# Patient Record
Sex: Male | Born: 1950 | Race: White | Hispanic: No | Marital: Married | Smoking: Never smoker
Health system: Southern US, Community
[De-identification: ages and names within clinical notes are randomized; demographics above are authoritative.]

## PROBLEM LIST (undated history)

## (undated) DIAGNOSIS — Z8619 Personal history of other infectious and parasitic diseases: Secondary | ICD-10-CM

## (undated) DIAGNOSIS — E785 Hyperlipidemia, unspecified: Secondary | ICD-10-CM

## (undated) DIAGNOSIS — K921 Melena: Secondary | ICD-10-CM

## (undated) DIAGNOSIS — R112 Nausea with vomiting, unspecified: Secondary | ICD-10-CM

## (undated) DIAGNOSIS — Z9889 Other specified postprocedural states: Secondary | ICD-10-CM

## (undated) HISTORY — DX: Hyperlipidemia, unspecified: E78.5

## (undated) HISTORY — DX: Melena: K92.1

## (undated) HISTORY — DX: Personal history of other infectious and parasitic diseases: Z86.19

## (undated) HISTORY — PX: COLONOSCOPY: SHX174

---

## 1996-07-13 HISTORY — PX: CHOLECYSTECTOMY: SHX55

## 2008-07-05 LAB — LIPID PANEL
CHOLESTEROL: 256 mg/dL — AB (ref 0–200)
HDL: 50 mg/dL (ref 35–70)
LDL Cholesterol: 167 mg/dL
Triglycerides: 195 mg/dL — AB (ref 40–160)

## 2008-10-03 LAB — LIPID PANEL
CHOLESTEROL: 199 mg/dL (ref 0–200)
HDL: 42 mg/dL (ref 35–70)
LDL Cholesterol: 134 mg/dL
Triglycerides: 116 mg/dL (ref 40–160)

## 2013-10-04 LAB — HM COLONOSCOPY

## 2013-10-05 LAB — CBC AND DIFFERENTIAL
HEMATOCRIT: 38 % — AB (ref 41–53)
Hemoglobin: 4.1 g/dL — AB (ref 13.5–17.5)
PLATELETS: 228 10*3/uL (ref 150–399)
WBC: 8.3 10^3/mL

## 2013-10-05 LAB — HEPATIC FUNCTION PANEL
ALT: 29 U/L (ref 10–40)
AST: 26 U/L (ref 14–40)
Alkaline Phosphatase: 84 U/L (ref 25–125)
BILIRUBIN DIRECT: 0.4 mg/dL (ref 0.01–0.4)
Bilirubin, Total: 0.5 mg/dL

## 2013-10-05 LAB — BASIC METABOLIC PANEL
BUN: 20 mg/dL (ref 4–21)
Creatinine: 1.1 mg/dL (ref 0.6–1.3)
GLUCOSE: 113 mg/dL
Potassium: 4.1 mmol/L (ref 3.4–5.3)
Sodium: 136 mmol/L — AB (ref 137–147)

## 2013-10-05 LAB — POCT INR: INR: 1 (ref 0.9–1.1)

## 2013-10-06 LAB — LIPID PANEL
CHOLESTEROL: 162 mg/dL (ref 0–200)
HDL: 37 mg/dL (ref 35–70)
LDL Cholesterol: 114 mg/dL
Triglycerides: 165 mg/dL — AB (ref 40–160)

## 2013-10-06 LAB — CBC AND DIFFERENTIAL
HCT: 34 % — AB (ref 41–53)
Hemoglobin: 11.5 g/dL — AB (ref 13.5–17.5)
Platelets: 213 10*3/uL (ref 150–399)
WBC: 5.4 10*3/mL

## 2013-10-06 LAB — HEMOGLOBIN A1C: Hgb A1c MFr Bld: 5.8 % (ref 4.0–6.0)

## 2013-10-10 LAB — CBC AND DIFFERENTIAL
HCT: 31 % — AB (ref 41–53)
HEMOGLOBIN: 10.3 g/dL — AB (ref 13.5–17.5)
Platelets: 255 10*3/uL (ref 150–399)
WBC: 5.6 10^3/mL

## 2013-10-19 LAB — CBC AND DIFFERENTIAL
HEMATOCRIT: 36 % — AB (ref 41–53)
HEMOGLOBIN: 11.5 g/dL — AB (ref 13.5–17.5)
Platelets: 292 10*3/uL (ref 150–399)
WBC: 5 10^3/mL

## 2013-10-31 LAB — HM COLONOSCOPY

## 2014-05-08 ENCOUNTER — Encounter: Payer: Self-pay | Admitting: Internal Medicine

## 2014-05-08 ENCOUNTER — Ambulatory Visit (INDEPENDENT_AMBULATORY_CARE_PROVIDER_SITE_OTHER): Payer: BC Managed Care – PPO | Admitting: Internal Medicine

## 2014-05-08 VITALS — BP 130/80 | HR 67 | Temp 97.9°F | Ht 68.0 in | Wt 158.5 lb

## 2014-05-08 DIAGNOSIS — M25562 Pain in left knee: Secondary | ICD-10-CM

## 2014-05-08 DIAGNOSIS — D649 Anemia, unspecified: Secondary | ICD-10-CM

## 2014-05-08 DIAGNOSIS — Z23 Encounter for immunization: Secondary | ICD-10-CM

## 2014-05-08 DIAGNOSIS — E7800 Pure hypercholesterolemia, unspecified: Secondary | ICD-10-CM

## 2014-05-08 DIAGNOSIS — Z125 Encounter for screening for malignant neoplasm of prostate: Secondary | ICD-10-CM

## 2014-05-08 DIAGNOSIS — R5383 Other fatigue: Secondary | ICD-10-CM

## 2014-05-08 DIAGNOSIS — G43109 Migraine with aura, not intractable, without status migrainosus: Secondary | ICD-10-CM

## 2014-05-08 DIAGNOSIS — G43809 Other migraine, not intractable, without status migrainosus: Secondary | ICD-10-CM

## 2014-05-08 DIAGNOSIS — R739 Hyperglycemia, unspecified: Secondary | ICD-10-CM

## 2014-05-08 DIAGNOSIS — L989 Disorder of the skin and subcutaneous tissue, unspecified: Secondary | ICD-10-CM

## 2014-05-08 NOTE — Progress Notes (Signed)
Pre visit review using our clinic review tool, if applicable. No additional management support is needed unless otherwise documented below in the visit note. 

## 2014-05-13 ENCOUNTER — Encounter: Payer: Self-pay | Admitting: Internal Medicine

## 2014-05-13 DIAGNOSIS — D649 Anemia, unspecified: Secondary | ICD-10-CM | POA: Insufficient documentation

## 2014-05-13 DIAGNOSIS — E7800 Pure hypercholesterolemia, unspecified: Secondary | ICD-10-CM | POA: Insufficient documentation

## 2014-05-13 DIAGNOSIS — L989 Disorder of the skin and subcutaneous tissue, unspecified: Secondary | ICD-10-CM | POA: Insufficient documentation

## 2014-05-13 DIAGNOSIS — R739 Hyperglycemia, unspecified: Secondary | ICD-10-CM | POA: Insufficient documentation

## 2014-05-13 DIAGNOSIS — G43109 Migraine with aura, not intractable, without status migrainosus: Secondary | ICD-10-CM | POA: Insufficient documentation

## 2014-05-13 DIAGNOSIS — M25562 Pain in left knee: Secondary | ICD-10-CM | POA: Insufficient documentation

## 2014-05-13 DIAGNOSIS — R5383 Other fatigue: Secondary | ICD-10-CM | POA: Insufficient documentation

## 2014-05-13 NOTE — Progress Notes (Signed)
Subjective:    Patient ID: Elijah Walker, male    DOB: 17-Mar-1951, 63 y.o.   MRN: [id number]  HPI 63 year old male with past history of ulcerative colitis, occular migraines and hypercholesterolemia.  He comes in today to follow up on these issues as well as to establish care.  He just moved here from [place].  Has been followed by Dr Veronda Prude at [facility] in [place], [place].  States was diagnosed with ulcerative colitis 20 years ago.  States not an issue for him now.  No blood in the stool.  Bowels doing well.  Has occular migraines.  First diagnosed 40 years ago.  Describes floaters and bright squiggles.  Has noticed some foods trigger.   Was diagnosed previously with elevated cholesterol.  Does exercise.  States jog/walks 30-45 minutes 3x/wk.  Takes omega.  Overall feels good.     Past Medical History  Diagnosis Date  . History of chicken pox   . Blood in stool     H/O  . Hyperlipidemia     Outpatient Encounter Prescriptions as of 05/08/2014  Medication Sig  . Omega-3 Fatty Acids (OMEGA 3 PO) Take by mouth 4 (four) times daily.    Review of Systems Patient denies any headache, lightheadedness or dizziness.  Occular migraines as outlined.  Non increased sinus congestion or drainage.  No chest pain, tightness or palpatations.  No increased shortness of breath, cough or congestion.  No nausea or vomiting.  No abdominal pain or cramping.  No bowel change, such as diarrhea, constipation, BRBPR or melana.  No urine change.  Does report a persistent forearm lesion.  Also reports increased pain in left knee (lateral aspect).  Not as bad lately.  Has been exercising.  Appears to be helping.  occasional ankle swelling (minimal).  Not a significant issues.        Objective:   Physical Exam Filed Vitals:   05/08/14 1112  BP: 130/80  Pulse: 67  Temp: 97.9 F (36.6 C)   Blood pressure recheck:  32/75  63 year old male in no acute distress.  HEENT:  Nares - clear.  Oropharynx - without  lesions. NECK:  Supple.  Nontender.  No audible carotid bruit.  HEART:  Appears to be regular.   LUNGS:  No crackles or wheezing audible.  Respirations even and unlabored.   RADIAL PULSE:  Equal bilaterally.  ABDOMEN:  Soft.  Nontender.  Bowel sounds present and normal.  No audible abdominal bruit.   EXTREMITIES:  No increased edema present.  DP pulses palpable and equal bilaterally.         Assessment & Plan:  1. Skin lesion of left arm Persistent left arm lesion.   - Ambulatory referral to Dermatology  2. Encounter for immunization Flu shot given today.   3. Hypercholesterolemia Low cholesterol diet and exericse.  - Lipid panel; Future  4. Ocular migraine Has been stable for 40 years.  Follow.  Continue to f/u with opthalmology.    5. Left knee pain Persistent knee pain.  Better now.  Follow.   6. Other fatigue - Comprehensive metabolic panel; Future - TSH; Future Reported some increased fatigue previously.  Exercising now. Follow.    7. Anemia, unspecified anemia type Noted on outside labs.  - CBC with Differential; Future - Ferritin; Future - Vitamin B12; Future  8. Hyperglycemia Noted on outside labs - elevated glucose.  Low carb diet.  - Hemoglobin A1c; Future  9. Prostate cancer screening - PSA; Future  HEALTH MAINTENANCE.  Schedule him for a physical.  Up to date with colonoscopy.  Last 2014.  Obtain records to review.    I spent 45 minutes with the patient and more than 50% of the time was spent in consultation regarding the above , specifically obtaining information from his past history and discussing current concerns and plan for further evaluation.

## 2014-05-15 ENCOUNTER — Other Ambulatory Visit (INDEPENDENT_AMBULATORY_CARE_PROVIDER_SITE_OTHER): Payer: BC Managed Care – PPO

## 2014-05-15 DIAGNOSIS — R5383 Other fatigue: Secondary | ICD-10-CM

## 2014-05-15 DIAGNOSIS — R739 Hyperglycemia, unspecified: Secondary | ICD-10-CM

## 2014-05-15 DIAGNOSIS — D649 Anemia, unspecified: Secondary | ICD-10-CM

## 2014-05-15 DIAGNOSIS — E7800 Pure hypercholesterolemia, unspecified: Secondary | ICD-10-CM

## 2014-05-15 DIAGNOSIS — Z125 Encounter for screening for malignant neoplasm of prostate: Secondary | ICD-10-CM

## 2014-05-15 LAB — CBC WITH DIFFERENTIAL/PLATELET
BASOS ABS: 0 10*3/uL (ref 0.0–0.1)
Basophils Relative: 0.5 % (ref 0.0–3.0)
Eosinophils Absolute: 0.2 10*3/uL (ref 0.0–0.7)
Eosinophils Relative: 2.7 % (ref 0.0–5.0)
HEMATOCRIT: 48.1 % (ref 39.0–52.0)
Hemoglobin: 15.9 g/dL (ref 13.0–17.0)
LYMPHS ABS: 1.8 10*3/uL (ref 0.7–4.0)
Lymphocytes Relative: 32.5 % (ref 12.0–46.0)
MCHC: 33 g/dL (ref 30.0–36.0)
MCV: 95 fl (ref 78.0–100.0)
MONOS PCT: 8.1 % (ref 3.0–12.0)
Monocytes Absolute: 0.5 10*3/uL (ref 0.1–1.0)
NEUTROS PCT: 56.2 % (ref 43.0–77.0)
Neutro Abs: 3.1 10*3/uL (ref 1.4–7.7)
PLATELETS: 202 10*3/uL (ref 150.0–400.0)
RBC: 5.06 Mil/uL (ref 4.22–5.81)
RDW: 14.3 % (ref 11.5–15.5)
WBC: 5.6 10*3/uL (ref 4.0–10.5)

## 2014-05-15 LAB — LIPID PANEL
Cholesterol: 228 mg/dL — ABNORMAL HIGH (ref 0–200)
HDL: 42.5 mg/dL (ref >39.00)
LDL Cholesterol: 166 mg/dL — ABNORMAL HIGH (ref 0–99)
NONHDL: 185.5
Total CHOL/HDL Ratio: 5
Triglycerides: 99 mg/dL (ref 0.0–149.0)
VLDL: 19.8 mg/dL (ref 0.0–40.0)

## 2014-05-15 LAB — COMPREHENSIVE METABOLIC PANEL
ALBUMIN: 3.6 g/dL (ref 3.5–5.2)
ALK PHOS: 67 U/L (ref 39–117)
ALT: 21 U/L (ref 0–53)
AST: 20 U/L (ref 0–37)
BILIRUBIN TOTAL: 1 mg/dL (ref 0.2–1.2)
BUN: 17 mg/dL (ref 6–23)
CO2: 28 mEq/L (ref 19–32)
Calcium: 9.7 mg/dL (ref 8.4–10.5)
Chloride: 105 mEq/L (ref 96–112)
Creatinine, Ser: 1.3 mg/dL (ref 0.4–1.5)
GFR: 57.75 mL/min — ABNORMAL LOW (ref >60.00)
Glucose, Bld: 96 mg/dL (ref 70–99)
POTASSIUM: 5.5 meq/L — AB (ref 3.5–5.1)
Sodium: 141 mEq/L (ref 135–145)
Total Protein: 6.9 g/dL (ref 6.0–8.3)

## 2014-05-15 LAB — VITAMIN B12: Vitamin B-12: 691 pg/mL (ref 211–911)

## 2014-05-15 LAB — FERRITIN: Ferritin: 20 ng/mL — ABNORMAL LOW (ref 22.0–322.0)

## 2014-05-15 LAB — PSA: PSA: 1.09 ng/mL (ref 0.10–4.00)

## 2014-05-15 LAB — TSH: TSH: 1.96 u[IU]/mL (ref 0.35–4.50)

## 2014-05-15 LAB — HEMOGLOBIN A1C: Hgb A1c MFr Bld: 5.7 % (ref 4.6–6.5)

## 2014-05-16 ENCOUNTER — Other Ambulatory Visit: Payer: Self-pay | Admitting: *Deleted

## 2014-05-16 ENCOUNTER — Other Ambulatory Visit: Payer: Self-pay | Admitting: Internal Medicine

## 2014-05-16 DIAGNOSIS — E875 Hyperkalemia: Secondary | ICD-10-CM

## 2014-05-16 MED ORDER — PRAVASTATIN SODIUM 10 MG PO TABS: 10.0000 mg | ORAL_TABLET | Freq: Every day | ORAL | Status: DC

## 2014-05-16 NOTE — Progress Notes (Signed)
Order placed for f/u labs.  

## 2014-05-18 ENCOUNTER — Other Ambulatory Visit (INDEPENDENT_AMBULATORY_CARE_PROVIDER_SITE_OTHER): Payer: BC Managed Care – PPO

## 2014-05-18 DIAGNOSIS — E875 Hyperkalemia: Secondary | ICD-10-CM

## 2014-05-18 LAB — CREATININE, SERUM: CREATININE: 1.3 mg/dL (ref 0.4–1.5)

## 2014-05-18 LAB — POTASSIUM: POTASSIUM: 4.9 meq/L (ref 3.5–5.1)

## 2014-06-21 ENCOUNTER — Other Ambulatory Visit: Payer: Self-pay | Admitting: Internal Medicine

## 2014-06-27 ENCOUNTER — Telehealth: Payer: Self-pay | Admitting: *Deleted

## 2014-06-27 DIAGNOSIS — E7800 Pure hypercholesterolemia, unspecified: Secondary | ICD-10-CM

## 2014-06-27 NOTE — Telephone Encounter (Signed)
What lab and dx?

## 2014-06-28 ENCOUNTER — Encounter: Payer: Self-pay | Admitting: *Deleted

## 2014-06-28 ENCOUNTER — Other Ambulatory Visit (INDEPENDENT_AMBULATORY_CARE_PROVIDER_SITE_OTHER): Payer: BC Managed Care – PPO

## 2014-06-28 DIAGNOSIS — E7800 Pure hypercholesterolemia, unspecified: Secondary | ICD-10-CM

## 2014-06-28 LAB — HEPATIC FUNCTION PANEL
ALBUMIN: 4.2 g/dL (ref 3.5–5.2)
ALT: 20 U/L (ref 0–53)
AST: 19 U/L (ref 0–37)
Alkaline Phosphatase: 62 U/L (ref 39–117)
Bilirubin, Direct: 0.2 mg/dL (ref 0.0–0.3)
TOTAL PROTEIN: 6.9 g/dL (ref 6.0–8.3)
Total Bilirubin: 0.9 mg/dL (ref 0.2–1.2)

## 2014-06-28 LAB — CREATININE, SERUM: CREATININE: 1.1 mg/dL (ref 0.4–1.5)

## 2014-06-28 NOTE — Telephone Encounter (Signed)
Orders placed for f/u labs.  

## 2014-07-02 ENCOUNTER — Telehealth: Payer: Self-pay | Admitting: Internal Medicine

## 2014-07-02 NOTE — Telephone Encounter (Signed)
Spoke to patient about his labs. Advised only a liver panel was checked after starting on a statin,  verbalized understanding

## 2014-07-02 NOTE — Telephone Encounter (Signed)
Elijah Walker called concerned about his labs. He said he received the results but saw nothing in regards to his Cholesterol. He thought he was having labs so he could see if the Prilostatin was helping him or harming him. Please call the pt to discuss why he received the labs and why Cholesterol wasn't included.  Pt ph# [phone] Thank you.

## 2014-08-08 ENCOUNTER — Encounter: Payer: Self-pay | Admitting: Internal Medicine

## 2014-08-14 ENCOUNTER — Ambulatory Visit (INDEPENDENT_AMBULATORY_CARE_PROVIDER_SITE_OTHER): Payer: BLUE CROSS/BLUE SHIELD | Admitting: Internal Medicine

## 2014-08-14 ENCOUNTER — Encounter: Payer: Self-pay | Admitting: Internal Medicine

## 2014-08-14 VITALS — BP 131/76 | HR 61 | Temp 98.1°F | Ht 68.0 in | Wt 162.8 lb

## 2014-08-14 DIAGNOSIS — E7800 Pure hypercholesterolemia, unspecified: Secondary | ICD-10-CM

## 2014-08-14 DIAGNOSIS — R739 Hyperglycemia, unspecified: Secondary | ICD-10-CM

## 2014-08-14 DIAGNOSIS — D649 Anemia, unspecified: Secondary | ICD-10-CM

## 2014-08-14 DIAGNOSIS — Z0000 Encounter for general adult medical examination without abnormal findings: Secondary | ICD-10-CM

## 2014-08-14 NOTE — Progress Notes (Signed)
Pre visit review using our clinic review tool, if applicable. No additional management support is needed unless otherwise documented below in the visit note. 

## 2014-08-16 ENCOUNTER — Telehealth: Payer: Self-pay | Admitting: *Deleted

## 2014-08-16 NOTE — Telephone Encounter (Signed)
Pt notified that health exam certification has been completed & placed up front for pick up.

## 2014-08-18 ENCOUNTER — Encounter: Payer: Self-pay | Admitting: Internal Medicine

## 2014-08-18 DIAGNOSIS — Z0000 Encounter for general adult medical examination without abnormal findings: Secondary | ICD-10-CM | POA: Insufficient documentation

## 2014-08-18 NOTE — Assessment & Plan Note (Signed)
On pravastatin now.  Leg tightness resolved.  Liver panel checked 06/2014 wnl.  Check lipid panel and metabolic panel with next fasting labs.

## 2014-08-18 NOTE — Progress Notes (Signed)
Patient ID: Elijah Walker, male   DOB: 04-15-1951, 64 y.o.   MRN: [id number]   Subjective:    Patient ID: Elijah Walker, male    DOB: 1950/10/30, 64 y.o.   MRN: [id number]  HPI  Patient here for his physical exam.  Has a history of hypercholesterolemia.  Started pravastatin.  Felt tightness in his legs when first starting.  Stretches.  Better now.  Discussed CoQ10.  Stays active.  Plans to start substitute teaching.     Past Medical History  Diagnosis Date  . History of chicken pox   . Blood in stool     H/O  . Hyperlipidemia     Current Outpatient Prescriptions on File Prior to Visit  Medication Sig Dispense Refill  . Omega-3 Fatty Acids (OMEGA 3 PO) Take by mouth 4 (four) times daily.    . pravastatin (PRAVACHOL) 10 MG tablet TAKE 1 TABLET (10 MG TOTAL) BY MOUTH DAILY. 30 tablet 1   No current facility-administered medications on file prior to visit.    Review of Systems  Constitutional: Negative for fatigue and unexpected weight change.  HENT: Negative for congestion, sinus pressure and sore throat.   Eyes: Negative for pain and visual disturbance.  Respiratory: Negative for cough, chest tightness and shortness of breath.   Cardiovascular: Negative for chest pain, palpitations and leg swelling.  Gastrointestinal: Negative for abdominal pain, diarrhea and constipation.  Genitourinary: Negative for frequency and difficulty urinating.  Musculoskeletal: Negative for back pain and joint swelling.       Previous leg tightness resolved.  Plans to continue stretches.    Skin: Negative for rash and wound.  Neurological: Negative for dizziness, light-headedness and headaches.  Hematological: Negative for adenopathy. Does not bruise/bleed easily.  Psychiatric/Behavioral: Negative for dysphoric mood and decreased concentration.       Objective:    Physical Exam  Constitutional: He is oriented to person, place, and time. He appears well-developed and well-nourished. No distress.    HENT:  Head: Normocephalic and atraumatic.  Nose: Nose normal.  Mouth/Throat: Oropharynx is clear and moist.  Eyes: Conjunctivae are normal. Right eye exhibits no discharge. Left eye exhibits no discharge.  Neck: Neck supple. No thyromegaly present.  Cardiovascular: Normal rate and regular rhythm.   Pulmonary/Chest: Breath sounds normal. No respiratory distress. He has no wheezes.  Abdominal: Soft. Bowel sounds are normal. There is no tenderness.  Genitourinary: Rectum normal and penis normal.  No testicular nodules.  Heme negative.    Musculoskeletal: He exhibits no edema or tenderness.  Lymphadenopathy:    He has no cervical adenopathy.  Neurological: He is alert and oriented to person, place, and time.  Skin: Skin is warm and dry. No rash noted.  Psychiatric: He has a normal mood and affect. His behavior is normal.    BP 131/76 mmHg  Pulse 61  Temp(Src) 98.1 F (36.7 C) (Oral)  Ht 5\' 8"  (1.727 m)  Wt 162 lb 12 oz (73.823 kg)  BMI 24.75 kg/m2  SpO2 96% Wt Readings from Last 3 Encounters:  08/14/14 162 lb 12 oz (73.823 kg)  05/08/14 158 lb 8 oz (71.895 kg)     Lab Results  Component Value Date   WBC 5.6 05/15/2014   HGB 15.9 05/15/2014   HCT 48.1 05/15/2014   PLT 202.0 05/15/2014   GLUCOSE 96 05/15/2014   CHOL 228* 05/15/2014   TRIG 99.0 05/15/2014   HDL 42.50 05/15/2014   LDLCALC 166* 05/15/2014   ALT 20 06/28/2014   AST  19 06/28/2014   NA 141 05/15/2014   K 4.9 05/18/2014   CL 105 05/15/2014   CREATININE 1.1 06/28/2014   BUN 17 05/15/2014   CO2 28 05/15/2014   TSH 1.96 05/15/2014   PSA 1.09 05/15/2014   INR 1.0 10/05/2013   HGBA1C 5.7 05/15/2014       Assessment & Plan:   Problem List Items Addressed This Visit    Anemia    Hgb checked 05/2014 - wnl.        Health care maintenance    Physical today.  PSA 05/15/14 - 1.09.  Colonoscopy 10/31/13.        Hypercholesterolemia    On pravastatin now.  Leg tightness resolved.  Liver panel checked  06/2014 wnl.  Check lipid panel and metabolic panel with next fasting labs.        Relevant Orders   Lipid panel   Hyperglycemia - Primary    Fasting glucose checked in 11/15 - wnl.        Relevant Orders   Comprehensive metabolic panel     I spent 25 minutes with the patient and more than 50% of the time was spent in consultation regarding the above.     [name], MD

## 2014-08-18 NOTE — Assessment & Plan Note (Signed)
Physical today.  PSA 05/15/14 - 1.09.  Colonoscopy 10/31/13.

## 2014-08-18 NOTE — Assessment & Plan Note (Signed)
Fasting glucose checked in 11/15 - wnl.

## 2014-08-18 NOTE — Assessment & Plan Note (Signed)
Hgb checked 05/2014 - wnl.

## 2014-08-29 ENCOUNTER — Ambulatory Visit (INDEPENDENT_AMBULATORY_CARE_PROVIDER_SITE_OTHER): Payer: BLUE CROSS/BLUE SHIELD | Admitting: *Deleted

## 2014-08-29 DIAGNOSIS — Z111 Encounter for screening for respiratory tuberculosis: Secondary | ICD-10-CM

## 2014-08-31 LAB — TB SKIN TEST
Induration: 0 mm
TB SKIN TEST: NEGATIVE

## 2014-09-08 ENCOUNTER — Other Ambulatory Visit: Payer: Self-pay | Admitting: Internal Medicine

## 2014-09-11 ENCOUNTER — Other Ambulatory Visit (INDEPENDENT_AMBULATORY_CARE_PROVIDER_SITE_OTHER): Payer: BLUE CROSS/BLUE SHIELD

## 2014-09-11 DIAGNOSIS — E7800 Pure hypercholesterolemia, unspecified: Secondary | ICD-10-CM

## 2014-09-11 DIAGNOSIS — R739 Hyperglycemia, unspecified: Secondary | ICD-10-CM

## 2014-09-11 LAB — COMPREHENSIVE METABOLIC PANEL
ALK PHOS: 65 U/L (ref 39–117)
ALT: 20 U/L (ref 0–53)
AST: 17 U/L (ref 0–37)
Albumin: 4.4 g/dL (ref 3.5–5.2)
BILIRUBIN TOTAL: 0.7 mg/dL (ref 0.2–1.2)
BUN: 16 mg/dL (ref 6–23)
CALCIUM: 9.8 mg/dL (ref 8.4–10.5)
CO2: 30 mEq/L (ref 19–32)
CREATININE: 1.3 mg/dL (ref 0.40–1.50)
Chloride: 105 mEq/L (ref 96–112)
GFR: 59.22 mL/min — ABNORMAL LOW (ref >60.00)
Glucose, Bld: 112 mg/dL — ABNORMAL HIGH (ref 70–99)
Potassium: 4.5 mEq/L (ref 3.5–5.1)
SODIUM: 141 meq/L (ref 135–145)
Total Protein: 7 g/dL (ref 6.0–8.3)

## 2014-09-11 LAB — LIPID PANEL
Cholesterol: 181 mg/dL (ref 0–200)
HDL: 43.2 mg/dL (ref >39.00)
LDL Cholesterol: 112 mg/dL — ABNORMAL HIGH (ref 0–99)
NONHDL: 137.8
TRIGLYCERIDES: 128 mg/dL (ref 0.0–149.0)
Total CHOL/HDL Ratio: 4
VLDL: 25.6 mg/dL (ref 0.0–40.0)

## 2014-09-12 ENCOUNTER — Encounter: Payer: Self-pay | Admitting: *Deleted

## 2014-09-12 ENCOUNTER — Other Ambulatory Visit: Payer: Self-pay | Admitting: Internal Medicine

## 2014-09-12 DIAGNOSIS — R739 Hyperglycemia, unspecified: Secondary | ICD-10-CM

## 2014-09-12 NOTE — Progress Notes (Signed)
Order placed for f/u labs.  

## 2014-10-04 ENCOUNTER — Other Ambulatory Visit (INDEPENDENT_AMBULATORY_CARE_PROVIDER_SITE_OTHER): Payer: BLUE CROSS/BLUE SHIELD

## 2014-10-04 DIAGNOSIS — R739 Hyperglycemia, unspecified: Secondary | ICD-10-CM

## 2014-10-04 LAB — BASIC METABOLIC PANEL
BUN: 15 mg/dL (ref 6–23)
CO2: 30 mEq/L (ref 19–32)
Calcium: 9.1 mg/dL (ref 8.4–10.5)
Chloride: 104 mEq/L (ref 96–112)
Creatinine, Ser: 1.15 mg/dL (ref 0.40–1.50)
GFR: 68.21 mL/min (ref >60.00)
Glucose, Bld: 89 mg/dL (ref 70–99)
Potassium: 4.6 mEq/L (ref 3.5–5.1)
SODIUM: 139 meq/L (ref 135–145)

## 2014-10-04 LAB — HEMOGLOBIN A1C: Hgb A1c MFr Bld: 5.9 % (ref 4.6–6.5)

## 2014-10-08 ENCOUNTER — Encounter: Payer: Self-pay | Admitting: *Deleted

## 2014-11-29 ENCOUNTER — Other Ambulatory Visit: Payer: Self-pay | Admitting: Internal Medicine

## 2015-01-10 ENCOUNTER — Ambulatory Visit (INDEPENDENT_AMBULATORY_CARE_PROVIDER_SITE_OTHER): Payer: BC Managed Care – PPO | Admitting: Internal Medicine

## 2015-01-10 ENCOUNTER — Encounter: Payer: Self-pay | Admitting: Internal Medicine

## 2015-01-10 VITALS — BP 121/78 | HR 55 | Temp 97.6°F | Ht 68.0 in | Wt 158.4 lb

## 2015-01-10 DIAGNOSIS — R067 Sneezing: Secondary | ICD-10-CM

## 2015-01-10 DIAGNOSIS — E7800 Pure hypercholesterolemia, unspecified: Secondary | ICD-10-CM

## 2015-01-10 DIAGNOSIS — R739 Hyperglycemia, unspecified: Secondary | ICD-10-CM

## 2015-01-10 DIAGNOSIS — Z0000 Encounter for general adult medical examination without abnormal findings: Secondary | ICD-10-CM | POA: Diagnosis not present

## 2015-01-10 NOTE — Progress Notes (Signed)
Patient ID: Elijah Walker, male   DOB: 1951/03/24, 64 y.o.   MRN: [id number]   Subjective:    Patient ID: Elijah Walker, male    DOB: September 13, 1950, 64 y.o.   MRN: [id number]  HPI  Patient here for a scheduled follow up.  Noticed (starting yesterday) some increased post nasal drainage and runny nose.  Some sneezing. No fever.  Minimal sore throat.  Taking vitamin C, benadryl and zinc.  Has lost weight.  Has adjusted his diet and was trying to lose weight.  No nausea or vomiting.  No bowel change.  Is exercising.  Walking.  Doing push ups.  No cardiac symptom with increased activity or exertion.  No sob.     Past Medical History  Diagnosis Date  . History of chicken pox   . Blood in stool     H/O  . Hyperlipidemia     Current Outpatient Prescriptions on File Prior to Visit  Medication Sig Dispense Refill  . Omega-3 Fatty Acids (OMEGA 3 PO) Take by mouth 4 (four) times daily.    . pravastatin (PRAVACHOL) 10 MG tablet TAKE 1 TABLET (10 MG TOTAL) BY MOUTH DAILY. 30 tablet 1   No current facility-administered medications on file prior to visit.    Review of Systems  Constitutional: Negative for appetite change and unexpected weight change (has adjusted his diiet.  trying to lose weight.  ).  HENT: Negative for sinus pressure.        Some sneezing.  Post nasal drainage.  Runny nose.  Minimal sore throat.   Eyes: Negative for pain and redness.  Respiratory: Negative for chest tightness and shortness of breath.   Cardiovascular: Negative for chest pain, palpitations and leg swelling.  Gastrointestinal: Negative for nausea, vomiting, abdominal pain and diarrhea.  Neurological: Negative for dizziness, light-headedness and headaches.  Psychiatric/Behavioral: Negative for dysphoric mood and agitation.       Objective:    Physical Exam  Constitutional: He appears well-developed and well-nourished. No distress.  HENT:  Nose: Nose normal.  Mouth/Throat: Oropharynx is clear and moist.  No  sinus tenderness to palpation.   Neck: Neck supple. No thyromegaly present.  Cardiovascular: Normal rate and regular rhythm.   Pulmonary/Chest: Effort normal and breath sounds normal. No respiratory distress.  Abdominal: Soft. Bowel sounds are normal. There is no tenderness.  Musculoskeletal: He exhibits no edema or tenderness.  Lymphadenopathy:    He has no cervical adenopathy.  Skin: No rash noted. No erythema.  Psychiatric: He has a normal mood and affect. His behavior is normal.    BP 121/78 mmHg  Pulse 55  Temp(Src) 97.6 F (36.4 C) (Oral)  Ht '5\' 8"'  (1.727 m)  Wt 158 lb 6 oz (71.838 kg)  BMI 24.09 kg/m2  SpO2 98% Wt Readings from Last 3 Encounters:  01/10/15 158 lb 6 oz (71.838 kg)  08/14/14 162 lb 12 oz (73.823 kg)  05/08/14 158 lb 8 oz (71.895 kg)     Lab Results  Component Value Date   WBC 5.6 05/15/2014   HGB 15.9 05/15/2014   HCT 48.1 05/15/2014   PLT 202.0 05/15/2014   GLUCOSE 89 10/04/2014   CHOL 181 09/11/2014   TRIG 128.0 09/11/2014   HDL 43.20 09/11/2014   LDLCALC 112* 09/11/2014   ALT 20 09/11/2014   AST 17 09/11/2014   NA 139 10/04/2014   K 4.6 10/04/2014   CL 104 10/04/2014   CREATININE 1.15 10/04/2014   BUN 15 10/04/2014   CO2 30  10/04/2014   TSH 1.96 05/15/2014   PSA 1.09 05/15/2014   INR 1.0 10/05/2013   HGBA1C 5.9 10/04/2014       Assessment & Plan:   Problem List Items Addressed This Visit    Health care maintenance - Primary    Physical 08/14/14.  PSA 05/15/14 - 1.09.  Colonoscopy 10/31/13.       Hypercholesterolemia    On pravastatin.  Low cholesterol diet and exercise.  Follow lipid panel and liver function tests.       Relevant Orders   Hepatic function panel   Lipid panel   Hyperglycemia    Low carb diet and exercise.  Follow met b and a1c.       Relevant Orders   Hemoglobin T9I   Basic metabolic panel   Sneezing    With sneezing, minimal sore throat, etc.  No abx warranted.  Saline nasal spray and nasacort nasal spray  as outlined.  Follow.           [name], MD

## 2015-01-10 NOTE — Progress Notes (Signed)
Pre visit review using our clinic review tool, if applicable. No additional management support is needed unless otherwise documented below in the visit note. 

## 2015-01-10 NOTE — Patient Instructions (Signed)
nasacort nasal spray - 2 sprays each nostril one time per day.  Do this in the evening.    Can use zyrtec or allegra - instead of benadryl.

## 2015-01-13 ENCOUNTER — Encounter: Payer: Self-pay | Admitting: Internal Medicine

## 2015-01-13 DIAGNOSIS — R067 Sneezing: Secondary | ICD-10-CM | POA: Insufficient documentation

## 2015-01-13 NOTE — Assessment & Plan Note (Signed)
Low carb diet and exercise.  Follow met b and a1c.  

## 2015-01-13 NOTE — Assessment & Plan Note (Signed)
On pravastatin.  Low cholesterol diet and exercise.  Follow lipid panel and liver function tests.   

## 2015-01-13 NOTE — Assessment & Plan Note (Signed)
With sneezing, minimal sore throat, etc.  No abx warranted.  Saline nasal spray and nasacort nasal spray as outlined.  Follow.

## 2015-01-13 NOTE — Assessment & Plan Note (Signed)
Physical 08/14/14.  PSA 05/15/14 - 1.09.  Colonoscopy 10/31/13.

## 2015-01-17 ENCOUNTER — Other Ambulatory Visit: Payer: BC Managed Care – PPO

## 2015-01-18 ENCOUNTER — Other Ambulatory Visit (INDEPENDENT_AMBULATORY_CARE_PROVIDER_SITE_OTHER): Payer: BC Managed Care – PPO

## 2015-01-18 DIAGNOSIS — E7800 Pure hypercholesterolemia, unspecified: Secondary | ICD-10-CM

## 2015-01-18 DIAGNOSIS — R739 Hyperglycemia, unspecified: Secondary | ICD-10-CM | POA: Diagnosis not present

## 2015-01-18 LAB — BASIC METABOLIC PANEL
BUN: 13 mg/dL (ref 6–23)
CALCIUM: 9.5 mg/dL (ref 8.4–10.5)
CO2: 30 mEq/L (ref 19–32)
Chloride: 107 mEq/L (ref 96–112)
Creatinine, Ser: 1.26 mg/dL (ref 0.40–1.50)
GFR: 61.33 mL/min (ref >60.00)
GLUCOSE: 97 mg/dL (ref 70–99)
Potassium: 4.4 mEq/L (ref 3.5–5.1)
Sodium: 142 mEq/L (ref 135–145)

## 2015-01-18 LAB — LIPID PANEL
CHOLESTEROL: 182 mg/dL (ref 0–200)
HDL: 51.1 mg/dL (ref >39.00)
LDL Cholesterol: 115 mg/dL — ABNORMAL HIGH (ref 0–99)
NonHDL: 130.9
TRIGLYCERIDES: 82 mg/dL (ref 0.0–149.0)
Total CHOL/HDL Ratio: 4
VLDL: 16.4 mg/dL (ref 0.0–40.0)

## 2015-01-18 LAB — HEPATIC FUNCTION PANEL
ALK PHOS: 69 U/L (ref 39–117)
ALT: 27 U/L (ref 0–53)
AST: 20 U/L (ref 0–37)
Albumin: 4.1 g/dL (ref 3.5–5.2)
BILIRUBIN DIRECT: 0.1 mg/dL (ref 0.0–0.3)
BILIRUBIN TOTAL: 0.8 mg/dL (ref 0.2–1.2)
Total Protein: 6.6 g/dL (ref 6.0–8.3)

## 2015-01-18 LAB — HEMOGLOBIN A1C: Hgb A1c MFr Bld: 5.7 % (ref 4.6–6.5)

## 2015-01-20 ENCOUNTER — Encounter: Payer: Self-pay | Admitting: Internal Medicine

## 2015-01-22 NOTE — Telephone Encounter (Signed)
Unread mychart message mailed to patient 

## 2015-02-03 ENCOUNTER — Encounter: Payer: Self-pay | Admitting: Internal Medicine

## 2015-02-03 DIAGNOSIS — Z86010 Personal history of colonic polyps: Secondary | ICD-10-CM | POA: Insufficient documentation

## 2015-02-03 DIAGNOSIS — H8110 Benign paroxysmal vertigo, unspecified ear: Secondary | ICD-10-CM | POA: Insufficient documentation

## 2015-02-03 DIAGNOSIS — R911 Solitary pulmonary nodule: Secondary | ICD-10-CM | POA: Insufficient documentation

## 2015-03-12 ENCOUNTER — Other Ambulatory Visit: Payer: Self-pay | Admitting: Internal Medicine

## 2015-05-13 ENCOUNTER — Ambulatory Visit: Payer: BC Managed Care – PPO | Admitting: Internal Medicine

## 2015-05-22 ENCOUNTER — Encounter: Payer: Self-pay | Admitting: Internal Medicine

## 2015-05-22 ENCOUNTER — Ambulatory Visit (INDEPENDENT_AMBULATORY_CARE_PROVIDER_SITE_OTHER): Payer: BC Managed Care – PPO | Admitting: Internal Medicine

## 2015-05-22 VITALS — BP 120/70 | HR 73 | Temp 97.9°F | Resp 18 | Ht 68.0 in | Wt 157.8 lb

## 2015-05-22 DIAGNOSIS — G43809 Other migraine, not intractable, without status migrainosus: Secondary | ICD-10-CM | POA: Diagnosis not present

## 2015-05-22 DIAGNOSIS — Z23 Encounter for immunization: Secondary | ICD-10-CM

## 2015-05-22 DIAGNOSIS — Z86010 Personal history of colonic polyps: Secondary | ICD-10-CM

## 2015-05-22 DIAGNOSIS — D649 Anemia, unspecified: Secondary | ICD-10-CM

## 2015-05-22 DIAGNOSIS — G43109 Migraine with aura, not intractable, without status migrainosus: Secondary | ICD-10-CM

## 2015-05-22 DIAGNOSIS — E7800 Pure hypercholesterolemia, unspecified: Secondary | ICD-10-CM

## 2015-05-22 DIAGNOSIS — Z125 Encounter for screening for malignant neoplasm of prostate: Secondary | ICD-10-CM

## 2015-05-22 DIAGNOSIS — R739 Hyperglycemia, unspecified: Secondary | ICD-10-CM

## 2015-05-22 NOTE — Progress Notes (Signed)
Pre-visit discussion using our clinic review tool. No additional management support is needed unless otherwise documented below in the visit note.  

## 2015-05-22 NOTE — Progress Notes (Signed)
Patient ID: Elijah Walker, male   DOB: [birth year], 64 y.o.   MRN: [id number]   Subjective:    Patient ID: Elijah Walker, male    DOB: January 15, 1951, 64 y.o.   MRN: [id number]  HPI  Patient with documented history of hypercholesterolemia who comes in today to follow up on these issues.  He has been jogging.  Also, lifting weights.  Feels better.  Has lost weight.  Has a history of visual migraines.  Controls if does not drink wine and avoids chocolate.  No cardiac symptoms with increased activity or exertion.  No sob.  No acid reflux reported.  No abdominal pain or cramping.  Bowels stable.  Handling stress relatively well.  Needs school form completed.     Past Medical History  Diagnosis Date  . History of chicken pox   . Blood in stool     H/O  . Hyperlipidemia    Past Surgical History  Procedure Laterality Date  . Cholecystectomy  1998   Family History  Problem Relation Age of Onset  . Diabetes Father    Social History   Social History  . Marital Status: Married    Spouse Name: N/A  . Number of Children: N/A  . Years of Education: N/A   Social History Main Topics  . Smoking status: Never Smoker   . Smokeless tobacco: Never Used  . Alcohol Use: No  . Drug Use: No  . Sexual Activity: Not Asked   Other Topics Concern  . None   Social History Narrative    Outpatient Encounter Prescriptions as of 05/22/2015  Medication Sig  . Omega-3 Fatty Acids (OMEGA 3 PO) Take by mouth 4 (four) times daily.  . pravastatin (PRAVACHOL) 10 MG tablet TAKE 1 TABLET BY MOUTH EVERY DAY  . [DISCONTINUED] diphenhydrAMINE (BENADRYL) 25 MG tablet Take 12.5 mg by mouth daily as needed.   No facility-administered encounter medications on file as of 05/22/2015.    Review of Systems  Constitutional:       Watching his diet.  Has lost weight.  Exercising.    HENT: Negative for congestion and sinus pressure.   Respiratory: Negative for cough, chest tightness and shortness of breath.     Cardiovascular: Negative for chest pain, palpitations and leg swelling.  Gastrointestinal: Negative for nausea, vomiting, abdominal pain and diarrhea.  Genitourinary: Negative for dysuria and difficulty urinating.  Musculoskeletal: Negative for back pain and joint swelling.  Skin: Negative for color change and rash.  Neurological: Negative for dizziness, light-headedness and headaches.  Psychiatric/Behavioral: Negative for dysphoric mood and agitation.       Objective:    Physical Exam  Constitutional: He appears well-developed and well-nourished. No distress.  HENT:  Nose: Nose normal.  Mouth/Throat: Oropharynx is clear and moist.  Eyes: Conjunctivae are normal. Right eye exhibits no discharge. Left eye exhibits no discharge.  Neck: Neck supple. No thyromegaly present.  Cardiovascular: Normal rate and regular rhythm.   Pulmonary/Chest: Effort normal and breath sounds normal. No respiratory distress.  Abdominal: Soft. Bowel sounds are normal. There is no tenderness.  Musculoskeletal: He exhibits no edema or tenderness.  Lymphadenopathy:    He has no cervical adenopathy.  Skin: Skin is warm and dry. No erythema.  Psychiatric: He has a normal mood and affect. His behavior is normal.    BP 120/70 mmHg  Pulse 73  Temp(Src) 97.9 F (36.6 C) (Oral)  Resp 18  Ht '5\' 8"'  (1.727 m)  Wt 157 lb 12 oz (71.555 kg)  BMI 23.99 kg/m2  SpO2 95% Wt Readings from Last 3 Encounters:  05/22/15 157 lb 12 oz (71.555 kg)  01/10/15 158 lb 6 oz (71.838 kg)  08/14/14 162 lb 12 oz (73.823 kg)     Lab Results  Component Value Date   WBC 5.6 05/15/2014   HGB 15.9 05/15/2014   HCT 48.1 05/15/2014   PLT 202.0 05/15/2014   GLUCOSE 97 01/18/2015   CHOL 182 01/18/2015   TRIG 82.0 01/18/2015   HDL 51.10 01/18/2015   LDLCALC 115* 01/18/2015   ALT 27 01/18/2015   AST 20 01/18/2015   NA 142 01/18/2015   K 4.4 01/18/2015   CL 107 01/18/2015   CREATININE 1.26 01/18/2015   BUN 13 01/18/2015    CO2 30 01/18/2015   TSH 1.96 05/15/2014   PSA 1.09 05/15/2014   INR 1.0 10/05/2013   HGBA1C 5.7 01/18/2015       Assessment & Plan:   Problem List Items Addressed This Visit    Anemia    Follow cbc.        Relevant Orders   CBC with Differential/Platelet   History of colonic polyps    Colonoscopy 10/31/13 - outlined in overview.  Recommended f/u colonoscopy in 10 years.        Hypercholesterolemia    On pravastatin.  Low cholesterol diet and exercise.  Follow lipid panel.        Relevant Orders   Lipid panel   Hepatic function panel   Hyperglycemia    Has adjusted diet.  Lost weight.  Follow met b and a1c.       Relevant Orders   TSH   Hemoglobin V2C   Basic metabolic panel   Ocular migraine    Not a significant issue for him now.  Controls his diet.  Follow.         Other Visit Diagnoses    Encounter for immunization    -  Primary    Prostate cancer screening        Relevant Orders    PSA        [name], MD

## 2015-05-22 NOTE — Patient Instructions (Signed)

## 2015-05-26 ENCOUNTER — Encounter: Payer: Self-pay | Admitting: Internal Medicine

## 2015-05-26 NOTE — Assessment & Plan Note (Signed)
On pravastatin.  Low cholesterol diet and exercise.  Follow lipid panel.   

## 2015-05-26 NOTE — Assessment & Plan Note (Signed)
Not a significant issue for him now.  Controls his diet.  Follow.

## 2015-05-26 NOTE — Assessment & Plan Note (Signed)
Has adjusted diet.  Lost weight.  Follow met b and a1c.  

## 2015-05-26 NOTE — Assessment & Plan Note (Signed)
Follow cbc.  

## 2015-05-26 NOTE — Assessment & Plan Note (Signed)
Colonoscopy 10/31/13 - outlined in overview.  Recommended f/u colonoscopy in 10 years.

## 2015-05-29 ENCOUNTER — Other Ambulatory Visit (INDEPENDENT_AMBULATORY_CARE_PROVIDER_SITE_OTHER): Payer: BC Managed Care – PPO

## 2015-05-29 DIAGNOSIS — Z125 Encounter for screening for malignant neoplasm of prostate: Secondary | ICD-10-CM

## 2015-05-29 DIAGNOSIS — R739 Hyperglycemia, unspecified: Secondary | ICD-10-CM

## 2015-05-29 DIAGNOSIS — D649 Anemia, unspecified: Secondary | ICD-10-CM | POA: Diagnosis not present

## 2015-05-29 DIAGNOSIS — E7800 Pure hypercholesterolemia, unspecified: Secondary | ICD-10-CM | POA: Diagnosis not present

## 2015-05-29 LAB — CBC WITH DIFFERENTIAL/PLATELET
BASOS ABS: 0 10*3/uL (ref 0.0–0.1)
Basophils Relative: 0.6 % (ref 0.0–3.0)
EOS ABS: 0.1 10*3/uL (ref 0.0–0.7)
Eosinophils Relative: 1.6 % (ref 0.0–5.0)
HEMATOCRIT: 49.4 % (ref 39.0–52.0)
HEMOGLOBIN: 16.4 g/dL (ref 13.0–17.0)
LYMPHS PCT: 31.1 % (ref 12.0–46.0)
Lymphs Abs: 1.9 10*3/uL (ref 0.7–4.0)
MCHC: 33.2 g/dL (ref 30.0–36.0)
MCV: 95.9 fl (ref 78.0–100.0)
Monocytes Absolute: 0.4 10*3/uL (ref 0.1–1.0)
Monocytes Relative: 6.3 % (ref 3.0–12.0)
NEUTROS ABS: 3.8 10*3/uL (ref 1.4–7.7)
Neutrophils Relative %: 60.4 % (ref 43.0–77.0)
PLATELETS: 201 10*3/uL (ref 150.0–400.0)
RBC: 5.15 Mil/uL (ref 4.22–5.81)
RDW: 13.3 % (ref 11.5–15.5)
WBC: 6.2 10*3/uL (ref 4.0–10.5)

## 2015-05-29 LAB — LIPID PANEL
CHOLESTEROL: 192 mg/dL (ref 0–200)
HDL: 47.2 mg/dL (ref >39.00)
LDL CALC: 122 mg/dL — AB (ref 0–99)
NonHDL: 145.11
TRIGLYCERIDES: 117 mg/dL (ref 0.0–149.0)
Total CHOL/HDL Ratio: 4
VLDL: 23.4 mg/dL (ref 0.0–40.0)

## 2015-05-29 LAB — HEPATIC FUNCTION PANEL
ALT: 23 U/L (ref 0–53)
AST: 17 U/L (ref 0–37)
Albumin: 4.3 g/dL (ref 3.5–5.2)
Alkaline Phosphatase: 64 U/L (ref 39–117)
BILIRUBIN DIRECT: 0.1 mg/dL (ref 0.0–0.3)
TOTAL PROTEIN: 6.5 g/dL (ref 6.0–8.3)
Total Bilirubin: 0.8 mg/dL (ref 0.2–1.2)

## 2015-05-29 LAB — BASIC METABOLIC PANEL
BUN: 15 mg/dL (ref 6–23)
CALCIUM: 9.7 mg/dL (ref 8.4–10.5)
CO2: 31 mEq/L (ref 19–32)
CREATININE: 1.15 mg/dL (ref 0.40–1.50)
Chloride: 106 mEq/L (ref 96–112)
GFR: 68.07 mL/min (ref >60.00)
GLUCOSE: 101 mg/dL — AB (ref 70–99)
Potassium: 5.2 mEq/L — ABNORMAL HIGH (ref 3.5–5.1)
SODIUM: 143 meq/L (ref 135–145)

## 2015-05-29 LAB — HEMOGLOBIN A1C: Hgb A1c MFr Bld: 5.7 % (ref 4.6–6.5)

## 2015-05-29 LAB — PSA: PSA: 1.42 ng/mL (ref 0.10–4.00)

## 2015-05-29 LAB — TSH: TSH: 1.76 u[IU]/mL (ref 0.35–4.50)

## 2015-05-30 ENCOUNTER — Encounter: Payer: Self-pay | Admitting: *Deleted

## 2015-05-30 ENCOUNTER — Encounter: Payer: Self-pay | Admitting: Internal Medicine

## 2015-05-30 ENCOUNTER — Other Ambulatory Visit: Payer: Self-pay | Admitting: Internal Medicine

## 2015-05-30 DIAGNOSIS — E875 Hyperkalemia: Secondary | ICD-10-CM

## 2015-05-30 NOTE — Progress Notes (Signed)
Order placed for f/u potassium.  

## 2015-05-31 NOTE — Telephone Encounter (Signed)
Spoke to pt.  Discussed above.  He had a f/u CT scan after the original scan.  Will try to get this for me.

## 2015-06-03 ENCOUNTER — Other Ambulatory Visit (INDEPENDENT_AMBULATORY_CARE_PROVIDER_SITE_OTHER): Payer: BC Managed Care – PPO

## 2015-06-03 DIAGNOSIS — E875 Hyperkalemia: Secondary | ICD-10-CM

## 2015-06-03 LAB — POTASSIUM: Potassium: 5.2 mEq/L — ABNORMAL HIGH (ref 3.5–5.1)

## 2015-06-04 ENCOUNTER — Encounter: Payer: Self-pay | Admitting: *Deleted

## 2015-06-04 ENCOUNTER — Other Ambulatory Visit: Payer: Self-pay | Admitting: Internal Medicine

## 2015-06-04 DIAGNOSIS — E875 Hyperkalemia: Secondary | ICD-10-CM

## 2015-06-04 NOTE — Progress Notes (Signed)
Order placed for f/u lab.   

## 2015-06-10 ENCOUNTER — Encounter: Payer: Self-pay | Admitting: Internal Medicine

## 2015-06-17 ENCOUNTER — Encounter: Payer: Self-pay | Admitting: Internal Medicine

## 2015-06-18 ENCOUNTER — Other Ambulatory Visit (INDEPENDENT_AMBULATORY_CARE_PROVIDER_SITE_OTHER): Payer: BC Managed Care – PPO

## 2015-06-18 DIAGNOSIS — E875 Hyperkalemia: Secondary | ICD-10-CM

## 2015-06-18 LAB — BASIC METABOLIC PANEL
BUN: 19 mg/dL (ref 6–23)
CHLORIDE: 105 meq/L (ref 96–112)
CO2: 29 mEq/L (ref 19–32)
CREATININE: 1.19 mg/dL (ref 0.40–1.50)
Calcium: 9.8 mg/dL (ref 8.4–10.5)
GFR: 65.43 mL/min (ref >60.00)
Glucose, Bld: 103 mg/dL — ABNORMAL HIGH (ref 70–99)
POTASSIUM: 5 meq/L (ref 3.5–5.1)
SODIUM: 142 meq/L (ref 135–145)

## 2015-06-19 ENCOUNTER — Encounter: Payer: Self-pay | Admitting: Internal Medicine

## 2015-07-24 ENCOUNTER — Telehealth: Payer: Self-pay | Admitting: Internal Medicine

## 2015-07-24 NOTE — Telephone Encounter (Signed)
Sent pt a message about scans received.  Same scan.  Needs f/u scan.  My chart message sent.

## 2015-07-29 ENCOUNTER — Telehealth: Payer: Self-pay | Admitting: Internal Medicine

## 2015-07-29 DIAGNOSIS — R911 Solitary pulmonary nodule: Secondary | ICD-10-CM

## 2015-07-29 NOTE — Telephone Encounter (Signed)
Spoke with pt about outside scan.  Pt agreeable to CT chest.  CT chest ordered.

## 2015-07-29 NOTE — Telephone Encounter (Signed)
Called and spoke to pt.  Explained reason for CT scan.  He is agreeable.  CT chest with contrast ordered.

## 2015-07-29 NOTE — Telephone Encounter (Signed)
Pt called about Dr Nicki Reaper left pt a message on 07/26/2015 to discuss Xray. Call pt @ [phone]. Thank You!

## 2015-08-07 ENCOUNTER — Ambulatory Visit
Admission: RE | Admit: 2015-08-07 | Discharge: 2015-08-07 | Disposition: A | Discharge status: Home / Self Care | Payer: BC Managed Care – PPO | Source: Ambulatory Visit | Attending: Internal Medicine | Admitting: Internal Medicine

## 2015-08-07 DIAGNOSIS — R911 Solitary pulmonary nodule: Secondary | ICD-10-CM

## 2015-08-07 DIAGNOSIS — Z01812 Encounter for preprocedural laboratory examination: Secondary | ICD-10-CM | POA: Insufficient documentation

## 2015-08-07 DIAGNOSIS — I2510 Atherosclerotic heart disease of native coronary artery without angina pectoris: Secondary | ICD-10-CM | POA: Diagnosis not present

## 2015-08-07 MED ORDER — IOHEXOL 300 MG/ML  SOLN
75.0000 mL | Freq: Once | INTRAMUSCULAR | Status: AC | PRN
  Administered 2015-08-07: 75 mL via INTRAVENOUS

## 2015-08-08 ENCOUNTER — Encounter: Payer: Self-pay | Admitting: Internal Medicine

## 2015-08-12 ENCOUNTER — Encounter: Payer: Self-pay | Admitting: *Deleted

## 2015-08-12 ENCOUNTER — Telehealth: Payer: Self-pay

## 2015-08-12 ENCOUNTER — Other Ambulatory Visit: Payer: Self-pay | Admitting: *Deleted

## 2015-08-12 MED ORDER — PRAVASTATIN SODIUM 20 MG PO TABS: 20.0000 mg | ORAL_TABLET | Freq: Every day | ORAL | Status: DC

## 2015-08-12 NOTE — Telephone Encounter (Signed)
Patient was called to follow up on MyChart message that was sent by Dr.Scott. He had read it. He was wondering if you had thought about changing his pravastatin due to the coronary build up they found.

## 2015-08-12 NOTE — Telephone Encounter (Signed)
Sent in new Rx & notified pt via mychart

## 2015-08-12 NOTE — Telephone Encounter (Signed)
I would recommend changing the pravastatin to 20mg  q day (instead of 10mg  he is currently taking).  Will follow cholesterol.  Would get more strict with where we want cholesterol level to be.

## 2015-09-23 ENCOUNTER — Encounter: Payer: BC Managed Care – PPO | Admitting: Internal Medicine

## 2015-10-09 ENCOUNTER — Encounter: Payer: BC Managed Care – PPO | Admitting: Internal Medicine

## 2015-12-19 ENCOUNTER — Ambulatory Visit (INDEPENDENT_AMBULATORY_CARE_PROVIDER_SITE_OTHER): Payer: BC Managed Care – PPO | Admitting: Family Medicine

## 2015-12-19 ENCOUNTER — Encounter: Payer: Self-pay | Admitting: Family Medicine

## 2015-12-19 VITALS — BP 122/72 | HR 80 | Temp 98.1°F | Ht 68.0 in | Wt 164.4 lb

## 2015-12-19 DIAGNOSIS — L989 Disorder of the skin and subcutaneous tissue, unspecified: Secondary | ICD-10-CM

## 2015-12-19 NOTE — Progress Notes (Signed)
   Subjective:  Patient ID: Elijah Walker, male    DOB: 27-Mar-1951  Age: 65 y.o. MRN: [id number]  CC: Skin lesion  HPI:  65 year old male presents with complaints of a skin lesion.  Patient reports that he's had this lesion for 6 years. It is located on the left side of his face just lateral to the eye. He states that it has been getting larger for the past 3 months. No reports of color change. Additionally, he states that it has been really painful for the past 2 weeks. He states that he often scratches or picks at it. He has not applied anything or tried any interventions. No known exacerbating or relieving factors.  Social Hx   Social History   Social History  . Marital Status: Married    Spouse Name: N/A  . Number of Children: N/A  . Years of Education: N/A   Social History Main Topics  . Smoking status: Never Smoker   . Smokeless tobacco: Never Used  . Alcohol Use: No  . Drug Use: No  . Sexual Activity: Not Asked   Other Topics Concern  . None   Social History Narrative   Review of Systems  Constitutional: Negative.   Skin:       Skin lesion.    Objective:  BP 122/72 mmHg  Pulse 80  Temp(Src) 98.1 F (36.7 C) (Oral)  Ht 5\' 8"  (1.727 m)  Wt 164 lb 6 oz (74.56 kg)  BMI 25.00 kg/m2  SpO2 95%  BP/Weight 12/19/2015 05/22/2015 0000000  Systolic BP 123XX123 123456 [id number]  Diastolic BP 72 70 78  Wt. (Lbs) 164.38 157.75 158.38  BMI 25 23.99 24.09   Physical Exam  Constitutional: He is oriented to person, place, and time. He appears well-developed. No distress.  Pulmonary/Chest: Effort normal.  Neurological: He is alert and oriented to person, place, and time.  Skin:  Face - small (<0.5 cm) firm, scaly papule noted. No color change. No drainage.  Psychiatric: He has a normal mood and affect.  Vitals reviewed.   Lab Results  Component Value Date   WBC 6.2 05/29/2015   HGB 16.4 05/29/2015   HCT 49.4 05/29/2015   PLT 201.0 05/29/2015   GLUCOSE 103* 06/18/2015   CHOL  192 05/29/2015   TRIG 117.0 05/29/2015   HDL 47.20 05/29/2015   LDLCALC 122* 05/29/2015   ALT 23 05/29/2015   AST 17 05/29/2015   NA 142 06/18/2015   K 5.0 06/18/2015   CL 105 06/18/2015   CREATININE 1.19 06/18/2015   BUN 19 06/18/2015   CO2 29 06/18/2015   TSH 1.76 05/29/2015   PSA 1.42 05/29/2015   INR 1.0 10/05/2013   HGBA1C 5.7 05/29/2015   Assessment & Plan:   Problem List Items Addressed This Visit    Skin lesion of face - Primary    New problem. 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 Needs biopsy. Sending to dermatology for biopsy/treatment      Relevant Orders   Ambulatory referral to Dermatology     Follow-up: PRN  Thersa Salt DO [facility]

## 2015-12-19 NOTE — Patient Instructions (Signed)
We will call with your dermatology appt.  Take care  Dr. Lacinda Axon

## 2015-12-19 NOTE — Progress Notes (Signed)
Pre visit review using our clinic review tool, if applicable. No additional management support is needed unless otherwise documented below in the visit note. 

## 2015-12-19 NOTE — Assessment & Plan Note (Addendum)
New problem. 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 Needs biopsy. Sending to dermatology for biopsy/treatment

## 2016-01-06 ENCOUNTER — Encounter: Payer: Self-pay | Admitting: Internal Medicine

## 2016-01-06 ENCOUNTER — Ambulatory Visit (INDEPENDENT_AMBULATORY_CARE_PROVIDER_SITE_OTHER): Payer: BC Managed Care – PPO | Admitting: Internal Medicine

## 2016-01-06 VITALS — BP 110/80 | HR 71 | Temp 98.3°F | Resp 18 | Ht 68.0 in | Wt 160.2 lb

## 2016-01-06 DIAGNOSIS — Z0000 Encounter for general adult medical examination without abnormal findings: Secondary | ICD-10-CM

## 2016-01-06 DIAGNOSIS — E7800 Pure hypercholesterolemia, unspecified: Secondary | ICD-10-CM | POA: Diagnosis not present

## 2016-01-06 DIAGNOSIS — R739 Hyperglycemia, unspecified: Secondary | ICD-10-CM

## 2016-01-06 DIAGNOSIS — Z86010 Personal history of colonic polyps: Secondary | ICD-10-CM

## 2016-01-06 DIAGNOSIS — R911 Solitary pulmonary nodule: Secondary | ICD-10-CM

## 2016-01-06 NOTE — Progress Notes (Signed)
Pre-visit discussion using our clinic review tool. No additional management support is needed unless otherwise documented below in the visit note.  

## 2016-01-06 NOTE — Progress Notes (Signed)
Patient ID: Elijah Walker, male   DOB: 1950-12-21, 65 y.o.   MRN: [id number]   Subjective:    Patient ID: Elijah Walker, male    DOB: [birth year], 65 y.o.   MRN: [id number]  HPI  Patient here for his physical exam.   He had previous back pain.  Back is better now.  He stays active.  No cardiac symptoms with increased activity or exertion.  No sob.  No acid reflux.  No abdominal pain or cramping.  Bowels stable. Does report hammer toe - 2nd left toe.  No pain.     Past Medical History  Diagnosis Date  . History of chicken pox   . Blood in stool     H/O  . Hyperlipidemia    Past Surgical History  Procedure Laterality Date  . Cholecystectomy  1998   Family History  Problem Relation Age of Onset  . Diabetes Father    Social History   Social History  . Marital Status: Married    Spouse Name: N/A  . Number of Children: N/A  . Years of Education: N/A   Social History Main Topics  . Smoking status: Never Smoker   . Smokeless tobacco: Never Used  . Alcohol Use: No  . Drug Use: No  . Sexual Activity: Not Asked   Other Topics Concern  . None   Social History Narrative    Outpatient Encounter Prescriptions as of 01/06/2016  Medication Sig  . Cholecalciferol (VITAMIN D-3 PO) Take by mouth.  . Omega-3 Fatty Acids (OMEGA 3 PO) Take by mouth 4 (four) times daily.  . pravastatin (PRAVACHOL) 20 MG tablet Take 1 tablet (20 mg total) by mouth daily.   No facility-administered encounter medications on file as of 01/06/2016.    Review of Systems  Constitutional: Negative for appetite change and unexpected weight change.  HENT: Negative for congestion and sinus pressure.   Eyes: Negative for pain and visual disturbance.  Respiratory: Negative for cough, chest tightness and shortness of breath.   Cardiovascular: Negative for chest pain, palpitations and leg swelling.  Gastrointestinal: Negative for nausea, vomiting, abdominal pain and diarrhea.  Genitourinary: Negative for dysuria  and difficulty urinating.  Musculoskeletal: Negative for back pain and joint swelling.  Skin: Negative for color change and rash.  Neurological: Negative for dizziness, light-headedness and headaches.  Hematological: Negative for adenopathy. Does not bruise/bleed easily.  Psychiatric/Behavioral: Negative for dysphoric mood and agitation.       Objective:     Blood pressure rechecked by me:  128/78  Physical Exam  Constitutional: He is oriented to person, place, and time. He appears well-developed and well-nourished. No distress.  HENT:  Head: Normocephalic and atraumatic.  Nose: Nose normal.  Mouth/Throat: Oropharynx is clear and moist. No oropharyngeal exudate.  Eyes: Conjunctivae are normal. Right eye exhibits no discharge. Left eye exhibits no discharge.  Neck: Neck supple. No thyromegaly present.  Cardiovascular: Normal rate and regular rhythm.   Pulmonary/Chest: Breath sounds normal. No respiratory distress. He has no wheezes.  Abdominal: Soft. Bowel sounds are normal. There is no tenderness.  Genitourinary: Rectum normal and penis normal.  Musculoskeletal: He exhibits no edema or tenderness.  Lymphadenopathy:    He has no cervical adenopathy.  Neurological: He is alert and oriented to person, place, and time.  Skin: Skin is warm and dry. No rash noted. No erythema.  Psychiatric: He has a normal mood and affect. His behavior is normal.    BP 110/80 mmHg  Pulse 71  Temp(Src) 98.3 F (36.8 C) (Oral)  Resp 18  Ht '5\' 8"'  (1.727 m)  Wt 160 lb 4 oz (72.689 kg)  BMI 24.37 kg/m2  SpO2 97% Wt Readings from Last 3 Encounters:  01/06/16 160 lb 4 oz (72.689 kg)  12/19/15 164 lb 6 oz (74.56 kg)  05/22/15 157 lb 12 oz (71.555 kg)     Lab Results  Component Value Date   WBC 6.2 05/29/2015   HGB 16.4 05/29/2015   HCT 49.4 05/29/2015   PLT 201.0 05/29/2015   GLUCOSE 103* 06/18/2015   CHOL 192 05/29/2015   TRIG 117.0 05/29/2015   HDL 47.20 05/29/2015   LDLCALC 122*  05/29/2015   ALT 23 05/29/2015   AST 17 05/29/2015   NA 142 06/18/2015   K 5.0 06/18/2015   CL 105 06/18/2015   CREATININE 1.19 06/18/2015   BUN 19 06/18/2015   CO2 29 06/18/2015   TSH 1.76 05/29/2015   PSA 1.42 05/29/2015   INR 1.0 10/05/2013   HGBA1C 5.7 05/29/2015    Ct Chest W Contrast  08/07/2015  CLINICAL DATA:  Pulmonary nodule. EXAM: CT CHEST WITH CONTRAST TECHNIQUE: Multidetector CT imaging of the chest was performed during intravenous contrast administration. CONTRAST:  78m OMNIPAQUE IOHEXOL 300 MG/ML  SOLN COMPARISON:  Outside CT abdomen pelvis 10/18/2013. FINDINGS: Mediastinum/Nodes: No pathologically enlarged mediastinal, hilar or axillary lymph nodes. Coronary artery calcification. Heart size normal. No pericardial effusion. Lungs/Pleura: Smoothly marginated nodule in the medial right lower lobe measures 1.2 x 1.7 cm, has central calcification and is unchanged from 10/18/2013. Lungs are otherwise clear. No pleural fluid. Airway is unremarkable. Upper abdomen: Visualized portions of the liver, adrenal glands, kidneys, spleen, pancreas, stomach and bowel are grossly unremarkable. No upper abdominal adenopathy. Cholecystectomy. Musculoskeletal: No worrisome lytic or sclerotic lesions. Degenerative changes are seen in the spine. IMPRESSION: 1. Right lower lobe nodule is smoothly marginated, has central calcification and is unchanged from 10/18/2013, rendering it benign. 2. Coronary artery calcification. Electronically Signed   By: MLorin PicketM.D.   On: 08/07/2015 10:19       Assessment & Plan:   Problem List Items Addressed This Visit    Health care maintenance    Physical today 01/06/16.  PSA - 1.42 05/29/15.   Colonoscopy 10/31/13.        History of colonic polyps    Colonoscopy 10/31/13.  Recommended f/u colonoscopy in 10 years.        Hypercholesterolemia    On pravastatin.  Low cholesterol diet and exercise.  Follow lipid panel and liver function tests.         Relevant Orders   Lipid panel   Hepatic function panel   Basic metabolic panel   Hyperglycemia - Primary    Low carb diet and exercise.  Follow met b and a1c.       Relevant Orders   Hemoglobin A1c   Lung nodule    CT repeated and revealed lung nodule unchanged from 2015.            SEinar Pheasant MD

## 2016-01-12 ENCOUNTER — Encounter: Payer: Self-pay | Admitting: Internal Medicine

## 2016-01-12 NOTE — Assessment & Plan Note (Signed)
Physical today 01/06/16.  PSA - 1.42 05/29/15.   Colonoscopy 10/31/13.

## 2016-01-12 NOTE — Assessment & Plan Note (Signed)
CT repeated and revealed lung nodule unchanged from 2015.

## 2016-01-12 NOTE — Assessment & Plan Note (Signed)
Colonoscopy 10/31/13.  Recommended f/u colonoscopy in 10 years.

## 2016-01-12 NOTE — Assessment & Plan Note (Signed)
On pravastatin.  Low cholesterol diet and exercise.  Follow lipid panel and liver function tests.   

## 2016-01-12 NOTE — Assessment & Plan Note (Signed)
Low carb diet and exercise.  Follow met b and a1c.  

## 2016-01-13 ENCOUNTER — Other Ambulatory Visit (INDEPENDENT_AMBULATORY_CARE_PROVIDER_SITE_OTHER): Payer: BC Managed Care – PPO

## 2016-01-13 DIAGNOSIS — R739 Hyperglycemia, unspecified: Secondary | ICD-10-CM

## 2016-01-13 DIAGNOSIS — E7800 Pure hypercholesterolemia, unspecified: Secondary | ICD-10-CM

## 2016-01-13 LAB — LIPID PANEL
CHOLESTEROL: 179 mg/dL (ref 0–200)
HDL: 46.5 mg/dL (ref >39.00)
LDL Cholesterol: 113 mg/dL — ABNORMAL HIGH (ref 0–99)
NonHDL: 132.64
TRIGLYCERIDES: 96 mg/dL (ref 0.0–149.0)
Total CHOL/HDL Ratio: 4
VLDL: 19.2 mg/dL (ref 0.0–40.0)

## 2016-01-13 LAB — HEPATIC FUNCTION PANEL
ALK PHOS: 55 U/L (ref 39–117)
ALT: 20 U/L (ref 0–53)
AST: 16 U/L (ref 0–37)
Albumin: 4.1 g/dL (ref 3.5–5.2)
BILIRUBIN DIRECT: 0.2 mg/dL (ref 0.0–0.3)
TOTAL PROTEIN: 6.4 g/dL (ref 6.0–8.3)
Total Bilirubin: 0.9 mg/dL (ref 0.2–1.2)

## 2016-01-13 LAB — BASIC METABOLIC PANEL
BUN: 15 mg/dL (ref 6–23)
CALCIUM: 9.2 mg/dL (ref 8.4–10.5)
CO2: 29 meq/L (ref 19–32)
Chloride: 107 mEq/L (ref 96–112)
Creatinine, Ser: 1.38 mg/dL (ref 0.40–1.50)
GFR: 55.05 mL/min — AB (ref >60.00)
GLUCOSE: 104 mg/dL — AB (ref 70–99)
Potassium: 4.3 mEq/L (ref 3.5–5.1)
Sodium: 137 mEq/L (ref 135–145)

## 2016-01-13 LAB — HEMOGLOBIN A1C: Hgb A1c MFr Bld: 5.7 % (ref 4.6–6.5)

## 2016-01-15 ENCOUNTER — Other Ambulatory Visit: Payer: Self-pay | Admitting: Internal Medicine

## 2016-01-15 DIAGNOSIS — R7989 Other specified abnormal findings of blood chemistry: Secondary | ICD-10-CM

## 2016-01-15 NOTE — Progress Notes (Signed)
Order placed for met b.

## 2016-01-29 ENCOUNTER — Other Ambulatory Visit (INDEPENDENT_AMBULATORY_CARE_PROVIDER_SITE_OTHER): Payer: BC Managed Care – PPO

## 2016-01-29 DIAGNOSIS — R748 Abnormal levels of other serum enzymes: Secondary | ICD-10-CM

## 2016-01-29 DIAGNOSIS — R7989 Other specified abnormal findings of blood chemistry: Secondary | ICD-10-CM

## 2016-01-29 LAB — BASIC METABOLIC PANEL
BUN: 13 mg/dL (ref 6–23)
CALCIUM: 9.5 mg/dL (ref 8.4–10.5)
CO2: 28 meq/L (ref 19–32)
CREATININE: 1.38 mg/dL (ref 0.40–1.50)
Chloride: 104 mEq/L (ref 96–112)
GFR: 55.04 mL/min — AB (ref >60.00)
GLUCOSE: 92 mg/dL (ref 70–99)
Potassium: 4.5 mEq/L (ref 3.5–5.1)
SODIUM: 141 meq/L (ref 135–145)

## 2016-01-30 ENCOUNTER — Other Ambulatory Visit: Payer: Self-pay | Admitting: Internal Medicine

## 2016-01-30 DIAGNOSIS — R7989 Other specified abnormal findings of blood chemistry: Secondary | ICD-10-CM

## 2016-01-30 NOTE — Progress Notes (Signed)
Orders placed for f/u labs.  

## 2016-02-06 ENCOUNTER — Encounter: Payer: Self-pay | Admitting: Internal Medicine

## 2016-03-05 ENCOUNTER — Other Ambulatory Visit (INDEPENDENT_AMBULATORY_CARE_PROVIDER_SITE_OTHER): Payer: BC Managed Care – PPO

## 2016-03-05 DIAGNOSIS — R7989 Other specified abnormal findings of blood chemistry: Secondary | ICD-10-CM

## 2016-03-05 DIAGNOSIS — R748 Abnormal levels of other serum enzymes: Secondary | ICD-10-CM | POA: Diagnosis not present

## 2016-03-05 LAB — BASIC METABOLIC PANEL
BUN: 14 mg/dL (ref 6–23)
CO2: 30 mEq/L (ref 19–32)
CREATININE: 1.22 mg/dL (ref 0.40–1.50)
Calcium: 9.3 mg/dL (ref 8.4–10.5)
Chloride: 104 mEq/L (ref 96–112)
GFR: 63.43 mL/min (ref >60.00)
GLUCOSE: 108 mg/dL — AB (ref 70–99)
POTASSIUM: 4.7 meq/L (ref 3.5–5.1)
Sodium: 139 mEq/L (ref 135–145)

## 2016-03-05 LAB — URINALYSIS, ROUTINE W REFLEX MICROSCOPIC
HGB URINE DIPSTICK: NEGATIVE
Ketones, ur: NEGATIVE
Leukocytes, UA: NEGATIVE
NITRITE: NEGATIVE
SPECIFIC GRAVITY, URINE: 1.01 (ref 1.000–1.030)
TOTAL PROTEIN, URINE-UPE24: NEGATIVE
Urine Glucose: NEGATIVE
Urobilinogen, UA: 1 (ref 0.0–1.0)
pH: 7.5 (ref 5.0–8.0)

## 2016-03-06 ENCOUNTER — Encounter: Payer: Self-pay | Admitting: Internal Medicine

## 2016-03-17 ENCOUNTER — Other Ambulatory Visit: Payer: BC Managed Care – PPO

## 2016-04-27 ENCOUNTER — Encounter: Payer: Self-pay | Admitting: Internal Medicine

## 2016-05-26 ENCOUNTER — Telehealth: Payer: Self-pay

## 2016-05-26 DIAGNOSIS — R7989 Other specified abnormal findings of blood chemistry: Secondary | ICD-10-CM

## 2016-05-26 NOTE — Telephone Encounter (Signed)
Pt coming for repeat labs 05/27/16. Please place future orders. Thank you.

## 2016-05-27 ENCOUNTER — Telehealth: Payer: Self-pay

## 2016-05-27 ENCOUNTER — Encounter: Payer: Self-pay | Admitting: Internal Medicine

## 2016-05-27 ENCOUNTER — Other Ambulatory Visit (INDEPENDENT_AMBULATORY_CARE_PROVIDER_SITE_OTHER): Payer: BC Managed Care – PPO

## 2016-05-27 DIAGNOSIS — R7989 Other specified abnormal findings of blood chemistry: Secondary | ICD-10-CM

## 2016-05-27 DIAGNOSIS — E7800 Pure hypercholesterolemia, unspecified: Secondary | ICD-10-CM

## 2016-05-27 LAB — LIPID PANEL
CHOL/HDL RATIO: 3
Cholesterol: 185 mg/dL (ref 0–200)
HDL: 55 mg/dL (ref >39.00)
LDL CALC: 111 mg/dL — AB (ref 0–99)
NonHDL: 130
TRIGLYCERIDES: 96 mg/dL (ref 0.0–149.0)
VLDL: 19.2 mg/dL (ref 0.0–40.0)

## 2016-05-27 LAB — BASIC METABOLIC PANEL
BUN: 11 mg/dL (ref 6–23)
CHLORIDE: 103 meq/L (ref 96–112)
CO2: 32 meq/L (ref 19–32)
CREATININE: 1.17 mg/dL (ref 0.40–1.50)
Calcium: 9.7 mg/dL (ref 8.4–10.5)
GFR: 66.52 mL/min (ref >60.00)
Glucose, Bld: 97 mg/dL (ref 70–99)
POTASSIUM: 4.3 meq/L (ref 3.5–5.1)
Sodium: 140 mEq/L (ref 135–145)

## 2016-05-27 NOTE — Telephone Encounter (Signed)
While pt was at lab appt today he requested to have his cholesterol checked. Pt said he made a drastic change in his diet and wants to see if it has affected it. Please advise.

## 2016-05-27 NOTE — Telephone Encounter (Signed)
Request sent to add on lab.

## 2016-05-27 NOTE — Telephone Encounter (Signed)
Order placed for f/u met b.  

## 2016-05-27 NOTE — Telephone Encounter (Signed)
Order placed for lipid panel.  Ok to add.

## 2016-07-18 ENCOUNTER — Other Ambulatory Visit: Payer: Self-pay | Admitting: Internal Medicine

## 2016-07-20 ENCOUNTER — Encounter: Payer: Self-pay | Admitting: Internal Medicine

## 2016-07-20 ENCOUNTER — Ambulatory Visit (INDEPENDENT_AMBULATORY_CARE_PROVIDER_SITE_OTHER): Payer: PPO | Admitting: Internal Medicine

## 2016-07-20 DIAGNOSIS — E7800 Pure hypercholesterolemia, unspecified: Secondary | ICD-10-CM

## 2016-07-20 DIAGNOSIS — R739 Hyperglycemia, unspecified: Secondary | ICD-10-CM

## 2016-07-20 DIAGNOSIS — R030 Elevated blood-pressure reading, without diagnosis of hypertension: Secondary | ICD-10-CM | POA: Diagnosis not present

## 2016-07-20 LAB — BASIC METABOLIC PANEL
BUN: 12 mg/dL (ref 6–23)
CALCIUM: 10 mg/dL (ref 8.4–10.5)
CO2: 31 meq/L (ref 19–32)
CREATININE: 1.06 mg/dL (ref 0.40–1.50)
Chloride: 104 mEq/L (ref 96–112)
GFR: 74.51 mL/min (ref >60.00)
GLUCOSE: 108 mg/dL — AB (ref 70–99)
Potassium: 4.8 mEq/L (ref 3.5–5.1)
Sodium: 142 mEq/L (ref 135–145)

## 2016-07-20 LAB — MICROALBUMIN / CREATININE URINE RATIO
Creatinine,U: 166.4 mg/dL
Microalb Creat Ratio: 0.4 mg/g (ref 0.0–30.0)
Microalb, Ur: <0.7 mg/dL (ref 0.0–1.9)

## 2016-07-20 NOTE — Progress Notes (Signed)
Pre visit review using our clinic review tool, if applicable. No additional management support is needed unless otherwise documented below in the visit note. 

## 2016-07-20 NOTE — Progress Notes (Signed)
Patient ID: Elijah Walker, male   DOB: 1950/12/17, 66 y.o.   MRN: [id number]   Subjective:    Patient ID: Elijah Walker, male    DOB: 01-Sep-1950, 66 y.o.   MRN: [id number]  HPI  Patient here for a scheduled follow up.  He has adjusted his diet.  Exercising 5 days per week.  No chest pain.  He feels good.  Monitors his blood pressure.  States is averaging 120s/70s.  Breathing stable.  No sob.  No acid reflux.  No abdominal pain or cramping.  Bowels doing better.  Off pravastatin.  Had muscle aches.  Has been off for 3-4 weeks.     Past Medical History:  Diagnosis Date  . Blood in stool    H/O  . History of chicken pox   . Hyperlipidemia    Past Surgical History:  Procedure Laterality Date  . CHOLECYSTECTOMY  1998   Family History  Problem Relation Age of Onset  . Diabetes Father    Social History   Social History  . Marital status: Married    Spouse name: N/A  . Number of children: N/A  . Years of education: N/A   Social History Main Topics  . Smoking status: Never Smoker  . Smokeless tobacco: Never Used  . Alcohol use No  . Drug use: No  . Sexual activity: Not Asked   Other Topics Concern  . None   Social History Narrative  . None    Outpatient Encounter Prescriptions as of 07/20/2016  Medication Sig  . Cholecalciferol (VITAMIN D-3 PO) Take by mouth.  . Omega-3 Fatty Acids (OMEGA 3 PO) Take by mouth 4 (four) times daily.  . pravastatin (PRAVACHOL) 20 MG tablet TAKE 1 TABLET (20 MG TOTAL) BY MOUTH DAILY. (Patient not taking: Reported on 07/20/2016)   No facility-administered encounter medications on file as of 07/20/2016.     Review of Systems  Constitutional: Negative for appetite change and unexpected weight change.  HENT: Negative for congestion and sinus pressure.   Respiratory: Negative for cough, chest tightness and shortness of breath.   Cardiovascular: Negative for chest pain, palpitations and leg swelling.  Gastrointestinal: Negative for abdominal pain,  diarrhea, nausea and vomiting.  Genitourinary: Negative for difficulty urinating and dysuria.  Musculoskeletal: Negative for back pain and joint swelling.  Skin: Negative for color change and rash.  Neurological: Negative for dizziness, light-headedness and headaches.  Psychiatric/Behavioral: Negative for agitation and dysphoric mood.       Objective:    Physical Exam  Constitutional: He appears well-developed and well-nourished. No distress.  HENT:  Nose: Nose normal.  Mouth/Throat: Oropharynx is clear and moist.  Neck: Neck supple. No thyromegaly present.  Cardiovascular: Normal rate and regular rhythm.   Pulmonary/Chest: Effort normal and breath sounds normal. No respiratory distress.  Abdominal: Soft. Bowel sounds are normal. There is no tenderness.  Musculoskeletal: He exhibits no edema or tenderness.  Lymphadenopathy:    He has no cervical adenopathy.  Skin: No rash noted. No erythema.  Psychiatric: He has a normal mood and affect. His behavior is normal.    BP 132/84   Pulse (!) 59   Temp 98 F (36.7 C) (Oral)   Wt 162 lb 6.4 oz (73.7 kg)   SpO2 97%   BMI 24.69 kg/m  Wt Readings from Last 3 Encounters:  07/20/16 162 lb 6.4 oz (73.7 kg)  01/06/16 160 lb 4 oz (72.7 kg)  12/19/15 164 lb 6 oz (74.6 kg)  Lab Results  Component Value Date   WBC 6.2 05/29/2015   HGB 16.4 05/29/2015   HCT 49.4 05/29/2015   PLT 201.0 05/29/2015   GLUCOSE 108 (H) 07/20/2016   CHOL 185 05/27/2016   TRIG 96.0 05/27/2016   HDL 55.00 05/27/2016   LDLCALC 111 (H) 05/27/2016   ALT 20 01/13/2016   AST 16 01/13/2016   NA 142 07/20/2016   K 4.8 07/20/2016   CL 104 07/20/2016   CREATININE 1.06 07/20/2016   BUN 12 07/20/2016   CO2 31 07/20/2016   TSH 1.76 05/29/2015   PSA 1.42 05/29/2015   INR 1.0 10/05/2013   HGBA1C 5.7 01/13/2016   MICROALBUR <0.7 07/20/2016       Assessment & Plan:   Problem List Items Addressed This Visit    Elevated blood pressure reading    Previous  elevation.  Recheck today 134/78.  Checks outside of office under good control.  Follow.        Relevant Orders   Basic metabolic panel (Completed)   Microalbumin / creatinine urine ratio (Completed)   Hypercholesterolemia    Off pravastatin.  Has adjusted his diet.  Has lost weight.  Follow lipid panel.       Hyperglycemia    Low carb diet and exercise.  Has adjusted his diet.  Is exercising regularly.  Follow.           [name], MD

## 2016-07-21 ENCOUNTER — Encounter: Payer: Self-pay | Admitting: Internal Medicine

## 2016-07-21 ENCOUNTER — Other Ambulatory Visit: Payer: Self-pay | Admitting: *Deleted

## 2016-07-21 ENCOUNTER — Other Ambulatory Visit (INDEPENDENT_AMBULATORY_CARE_PROVIDER_SITE_OTHER): Payer: PPO

## 2016-07-21 ENCOUNTER — Other Ambulatory Visit: Payer: Self-pay | Admitting: Internal Medicine

## 2016-07-21 DIAGNOSIS — Z125 Encounter for screening for malignant neoplasm of prostate: Secondary | ICD-10-CM

## 2016-07-21 LAB — PSA: PSA: 1.61 ng/mL (ref 0.10–4.00)

## 2016-07-21 NOTE — Assessment & Plan Note (Signed)
Off pravastatin.  Has adjusted his diet.  Has lost weight.  Follow lipid panel.

## 2016-07-21 NOTE — Progress Notes (Signed)
Order placed for psa.  

## 2016-07-21 NOTE — Assessment & Plan Note (Signed)
Previous elevation.  Recheck today 134/78.  Checks outside of office under good control.  Follow.

## 2016-07-21 NOTE — Assessment & Plan Note (Signed)
Low carb diet and exercise.  Has adjusted his diet.  Is exercising regularly.  Follow.

## 2016-08-07 ENCOUNTER — Telehealth: Payer: Self-pay | Admitting: *Deleted

## 2016-08-07 ENCOUNTER — Telehealth: Payer: Self-pay | Admitting: Internal Medicine

## 2016-08-07 NOTE — Telephone Encounter (Signed)
Reason for call:? Flu  Symptoms:sneezing, cough, congestion, scratchy throat, muscle achy, chills, no fever, sore throat Duration the past 2 days  Please advise , Advise patient he should go to urgent care to get evaluated.  Please advise.

## 2016-08-07 NOTE — Telephone Encounter (Signed)
Pt called and left vm stating that he thinks that he has the flu. Left msg to have him call the office back with his symptoms.

## 2016-08-07 NOTE — Telephone Encounter (Signed)
Left message advising on voicemail need to go to urgent care for evaluation.

## 2016-08-07 NOTE — Telephone Encounter (Signed)
Pt stated that he has the flu, he has symptoms of fatigue, muscle pain, congestion ,cough and sore throat  Pt requested a call to discuss if he should have something called into the pharmacy, or advise to help his symptoms.  Pt contact [phone]

## 2016-08-07 NOTE — Telephone Encounter (Signed)
Agree with need for evaluation so as to know how to treat.

## 2016-08-20 ENCOUNTER — Encounter: Payer: Self-pay | Admitting: Internal Medicine

## 2016-08-20 DIAGNOSIS — E7800 Pure hypercholesterolemia, unspecified: Secondary | ICD-10-CM

## 2016-08-20 DIAGNOSIS — R739 Hyperglycemia, unspecified: Secondary | ICD-10-CM

## 2016-08-20 NOTE — Telephone Encounter (Signed)
I am ok with scheduling the cholesterol check when he comes in for his vaccination.  Is he able to be placed on lab schedule?

## 2016-08-26 NOTE — Telephone Encounter (Signed)
I have placed the order for the labs.  Please notify pt of appt date and time.  I was not going to repeat the kidney function tests since it was just drawn in 07/2016.  Please confirm with pt that this is ok.  Let me know if he wants it repeated.

## 2016-09-01 ENCOUNTER — Other Ambulatory Visit (INDEPENDENT_AMBULATORY_CARE_PROVIDER_SITE_OTHER): Payer: PPO

## 2016-09-01 ENCOUNTER — Ambulatory Visit (INDEPENDENT_AMBULATORY_CARE_PROVIDER_SITE_OTHER): Payer: PPO

## 2016-09-01 DIAGNOSIS — R739 Hyperglycemia, unspecified: Secondary | ICD-10-CM

## 2016-09-01 DIAGNOSIS — Z23 Encounter for immunization: Secondary | ICD-10-CM | POA: Diagnosis not present

## 2016-09-01 DIAGNOSIS — E7800 Pure hypercholesterolemia, unspecified: Secondary | ICD-10-CM

## 2016-09-01 LAB — LIPID PANEL
CHOL/HDL RATIO: 5
Cholesterol: 224 mg/dL — ABNORMAL HIGH (ref 0–200)
HDL: 45.4 mg/dL (ref >39.00)
LDL CALC: 155 mg/dL — AB (ref 0–99)
NonHDL: 178.66
TRIGLYCERIDES: 120 mg/dL (ref 0.0–149.0)
VLDL: 24 mg/dL (ref 0.0–40.0)

## 2016-09-01 LAB — CBC WITH DIFFERENTIAL/PLATELET
BASOS ABS: 0 10*3/uL (ref 0.0–0.1)
Basophils Relative: 0.8 % (ref 0.0–3.0)
EOS ABS: 0.1 10*3/uL (ref 0.0–0.7)
Eosinophils Relative: 2.2 % (ref 0.0–5.0)
HEMATOCRIT: 47.5 % (ref 39.0–52.0)
Hemoglobin: 15.9 g/dL (ref 13.0–17.0)
LYMPHS PCT: 36.3 % (ref 12.0–46.0)
Lymphs Abs: 1.4 10*3/uL (ref 0.7–4.0)
MCHC: 33.4 g/dL (ref 30.0–36.0)
MCV: 96.9 fl (ref 78.0–100.0)
Monocytes Absolute: 0.4 10*3/uL (ref 0.1–1.0)
Monocytes Relative: 10.5 % (ref 3.0–12.0)
NEUTROS ABS: 2 10*3/uL (ref 1.4–7.7)
NEUTROS PCT: 50.2 % (ref 43.0–77.0)
PLATELETS: 203 10*3/uL (ref 150.0–400.0)
RBC: 4.91 Mil/uL (ref 4.22–5.81)
RDW: 13.4 % (ref 11.5–15.5)
WBC: 4 10*3/uL (ref 4.0–10.5)

## 2016-09-01 LAB — HEPATIC FUNCTION PANEL
ALBUMIN: 4.3 g/dL (ref 3.5–5.2)
ALK PHOS: 63 U/L (ref 39–117)
ALT: 23 U/L (ref 0–53)
AST: 16 U/L (ref 0–37)
Bilirubin, Direct: 0.2 mg/dL (ref 0.0–0.3)
Total Bilirubin: 1 mg/dL (ref 0.2–1.2)
Total Protein: 6.6 g/dL (ref 6.0–8.3)

## 2016-09-01 LAB — HEMOGLOBIN A1C: HEMOGLOBIN A1C: 5.8 % (ref 4.6–6.5)

## 2016-09-01 NOTE — Progress Notes (Addendum)
Patient comes in for Prevnar 13 injection.  Injected right deltoid.  Patient tolerated injection.     Reviewed.  Dr Nicki Reaper

## 2016-09-02 ENCOUNTER — Encounter: Payer: Self-pay | Admitting: Internal Medicine

## 2016-09-22 ENCOUNTER — Telehealth: Payer: Self-pay | Admitting: Internal Medicine

## 2016-09-22 NOTE — Telephone Encounter (Signed)
Left pt message asking to call Ebony Hail back directly at [phone] to schedule Welcome to [facility] Exam only. Thanks!

## 2016-10-02 ENCOUNTER — Encounter: Payer: Self-pay | Admitting: Internal Medicine

## 2016-11-06 NOTE — Telephone Encounter (Signed)
Scheduled 02/25/17

## 2016-11-30 ENCOUNTER — Telehealth: Payer: Self-pay | Admitting: Internal Medicine

## 2016-11-30 ENCOUNTER — Encounter: Payer: Self-pay | Admitting: Internal Medicine

## 2016-11-30 DIAGNOSIS — E7800 Pure hypercholesterolemia, unspecified: Secondary | ICD-10-CM

## 2016-11-30 DIAGNOSIS — R739 Hyperglycemia, unspecified: Secondary | ICD-10-CM

## 2016-11-30 NOTE — Telephone Encounter (Signed)
Pt called and would like orders for a full blood work up. Please advise, thank you!  Call pt @ [phone]

## 2016-11-30 NOTE — Telephone Encounter (Signed)
Can you put labs in and I will call and make app

## 2016-11-30 NOTE — Telephone Encounter (Signed)
I have placed orders for labs.  I am not sure what is meant by a full panel.  I did not order cbc or psa - just had those done.  I placed other orders.  Ok to schedule fasting lab appt.

## 2016-12-01 NOTE — Telephone Encounter (Signed)
App has been made see mychart message patient informed

## 2016-12-04 ENCOUNTER — Encounter: Payer: Self-pay | Admitting: Family Medicine

## 2016-12-04 ENCOUNTER — Ambulatory Visit (INDEPENDENT_AMBULATORY_CARE_PROVIDER_SITE_OTHER): Payer: PPO | Admitting: Family Medicine

## 2016-12-04 VITALS — BP 122/78 | HR 60 | Temp 97.6°F | Wt 159.6 lb

## 2016-12-04 DIAGNOSIS — L989 Disorder of the skin and subcutaneous tissue, unspecified: Secondary | ICD-10-CM | POA: Diagnosis not present

## 2016-12-04 NOTE — Patient Instructions (Signed)
Nice to see you. I believe the skin lesions are benign though we will get you to see dermatology for skin exam. If you do not hear about this in the next week please contact us.

## 2016-12-04 NOTE — Progress Notes (Signed)
  Tommi Rumps, MD Phone: [phone]  Elijah Walker is a 66 y.o. male who presents today for same-day visit.  Patient notes 3 new skin lesions on his abdomen. Notes he did not notice them until yesterday. Notes they appear brown and 2 of them have several small spots within them. He has not noted any other new skin changes.   ROS see history of present illness  Objective  Physical Exam Vitals:   12/04/16 1316  BP: 122/78  Pulse: 60  Temp: 97.6 F (36.4 C)    BP Readings from Last 3 Encounters:  12/04/16 122/78  07/20/16 132/84  01/06/16 110/80   Wt Readings from Last 3 Encounters:  12/04/16 159 lb 9.6 oz (72.4 kg)  07/20/16 162 lb 6.4 oz (73.7 kg)  01/06/16 160 lb 4 oz (72.7 kg)    Physical Exam  Constitutional: He is well-developed, well-nourished, and in no distress.  Cardiovascular: Normal rate, regular rhythm and normal heart sounds.   Neurological: He is alert.  Skin:  3 brown skin lesions over his upper abdomen and one in the right upper quadrant, one in the epigastric region, and one in the left upper quadrant, right upper quadrant is slightly larger than the other 2, they appear brown with slight variance in color within them, there is a slight stuck on appearance to the one in the right upper quadrant, patient with several solar lentigines on his hands bilaterally, several normal-appearing moles on his back     Assessment/Plan: Please see individual problem list.  Skin lesion I suspect these are small seborrheic keratoses though we will refer to dermatology for further evaluation and skin exam. Referral placed.   Orders Placed This Encounter  Procedures  . Ambulatory referral to Dermatology    Referral Priority:   Routine    Referral Type:   Consultation    Referral Reason:   Specialty Services Required    Requested Specialty:   Dermatology    Number of Visits Requested:   1   Tommi Rumps, MD [place]

## 2016-12-04 NOTE — Assessment & Plan Note (Signed)
I suspect these are small seborrheic keratoses though we will refer to dermatology for further evaluation and skin exam. Referral placed.

## 2016-12-21 ENCOUNTER — Encounter: Payer: Self-pay | Admitting: Internal Medicine

## 2016-12-22 NOTE — Telephone Encounter (Signed)
Pt wants to come in on Wednesday for blood type.  He already has an appt for labs.  I want to make sure I order the correct test.  He wants to know his blood type.  Do I just order a type and screen.  If so, the resulting agency listed is [facility].  Is this correct.  Thanks.

## 2016-12-23 ENCOUNTER — Other Ambulatory Visit: Payer: Self-pay | Admitting: Internal Medicine

## 2016-12-23 ENCOUNTER — Other Ambulatory Visit (INDEPENDENT_AMBULATORY_CARE_PROVIDER_SITE_OTHER): Payer: PPO

## 2016-12-23 ENCOUNTER — Telehealth: Payer: Self-pay | Admitting: Radiology

## 2016-12-23 DIAGNOSIS — E7800 Pure hypercholesterolemia, unspecified: Secondary | ICD-10-CM | POA: Diagnosis not present

## 2016-12-23 DIAGNOSIS — R739 Hyperglycemia, unspecified: Secondary | ICD-10-CM

## 2016-12-23 DIAGNOSIS — Z0000 Encounter for general adult medical examination without abnormal findings: Secondary | ICD-10-CM

## 2016-12-23 LAB — HEPATIC FUNCTION PANEL
ALBUMIN: 4.3 g/dL (ref 3.5–5.2)
ALK PHOS: 69 U/L (ref 39–117)
ALT: 26 U/L (ref 0–53)
AST: 17 U/L (ref 0–37)
Bilirubin, Direct: 0.2 mg/dL (ref 0.0–0.3)
Total Bilirubin: 1 mg/dL (ref 0.2–1.2)
Total Protein: 6.2 g/dL (ref 6.0–8.3)

## 2016-12-23 LAB — BASIC METABOLIC PANEL
BUN: 12 mg/dL (ref 6–23)
CALCIUM: 9.5 mg/dL (ref 8.4–10.5)
CO2: 30 mEq/L (ref 19–32)
Chloride: 106 mEq/L (ref 96–112)
Creatinine, Ser: 1.11 mg/dL (ref 0.40–1.50)
GFR: 70.56 mL/min (ref >60.00)
GLUCOSE: 98 mg/dL (ref 70–99)
Potassium: 4.4 mEq/L (ref 3.5–5.1)
SODIUM: 142 meq/L (ref 135–145)

## 2016-12-23 LAB — LIPID PANEL
CHOLESTEROL: 218 mg/dL — AB (ref 0–200)
HDL: 41.6 mg/dL (ref >39.00)
LDL CALC: 150 mg/dL — AB (ref 0–99)
NonHDL: 176.53
TRIGLYCERIDES: 131 mg/dL (ref 0.0–149.0)
Total CHOL/HDL Ratio: 5
VLDL: 26.2 mg/dL (ref 0.0–40.0)

## 2016-12-23 LAB — TSH: TSH: 1.78 u[IU]/mL (ref 0.35–4.50)

## 2016-12-23 LAB — POCT GLYCOSYLATED HEMOGLOBIN (HGB A1C): Hemoglobin A1C: 5.5

## 2016-12-23 NOTE — Addendum Note (Signed)
Addended by: Arby Barrette on: 12/23/2016 10:55 AM   Modules accepted: Orders

## 2016-12-23 NOTE — Progress Notes (Signed)
Order placed for blood type per pt request.

## 2016-12-23 NOTE — Telephone Encounter (Signed)
He did not I apologize I did not see the message until later. I'm not sure of what test that would be to test for typing of blood or what to order. It would have to be ordered through [facility] or [facility].

## 2016-12-23 NOTE — Telephone Encounter (Signed)
I have placed the order for the lab.  Thanks

## 2016-12-23 NOTE — Telephone Encounter (Signed)
Pt came in for labs today and requested his blood type be drawn. I'm not sure what kind of test that would be or the tube that needed to be used.

## 2016-12-23 NOTE — Telephone Encounter (Signed)
Does this mean that he did not get this lab drawn.  I had sent a message to see what test I needed to order.  I had assumed it would be a type and screen,but was not sure.

## 2016-12-23 NOTE — Addendum Note (Signed)
Addended by: Arby Barrette on: 12/23/2016 11:04 AM   Modules accepted: Orders

## 2016-12-23 NOTE — Telephone Encounter (Signed)
Ok thank you labs sent to [facility]

## 2016-12-24 ENCOUNTER — Encounter: Payer: Self-pay | Admitting: Internal Medicine

## 2016-12-24 LAB — ABO AND RH: Rh Factor: POSITIVE

## 2016-12-25 ENCOUNTER — Encounter: Payer: Self-pay | Admitting: Internal Medicine

## 2016-12-29 DIAGNOSIS — D1801 Hemangioma of skin and subcutaneous tissue: Secondary | ICD-10-CM | POA: Diagnosis not present

## 2016-12-29 DIAGNOSIS — D229 Melanocytic nevi, unspecified: Secondary | ICD-10-CM | POA: Diagnosis not present

## 2016-12-29 DIAGNOSIS — L821 Other seborrheic keratosis: Secondary | ICD-10-CM | POA: Diagnosis not present

## 2016-12-29 DIAGNOSIS — L820 Inflamed seborrheic keratosis: Secondary | ICD-10-CM | POA: Diagnosis not present

## 2017-01-18 ENCOUNTER — Encounter: Payer: PPO | Admitting: Internal Medicine

## 2017-02-25 ENCOUNTER — Ambulatory Visit (INDEPENDENT_AMBULATORY_CARE_PROVIDER_SITE_OTHER): Payer: PPO

## 2017-02-25 ENCOUNTER — Encounter: Payer: Self-pay | Admitting: Internal Medicine

## 2017-02-25 ENCOUNTER — Ambulatory Visit (INDEPENDENT_AMBULATORY_CARE_PROVIDER_SITE_OTHER): Payer: PPO | Admitting: Internal Medicine

## 2017-02-25 DIAGNOSIS — R911 Solitary pulmonary nodule: Secondary | ICD-10-CM | POA: Diagnosis not present

## 2017-02-25 DIAGNOSIS — R739 Hyperglycemia, unspecified: Secondary | ICD-10-CM | POA: Diagnosis not present

## 2017-02-25 DIAGNOSIS — M25512 Pain in left shoulder: Secondary | ICD-10-CM

## 2017-02-25 DIAGNOSIS — E7800 Pure hypercholesterolemia, unspecified: Secondary | ICD-10-CM

## 2017-02-25 DIAGNOSIS — Z0000 Encounter for general adult medical examination without abnormal findings: Secondary | ICD-10-CM

## 2017-02-25 DIAGNOSIS — Z0001 Encounter for general adult medical examination with abnormal findings: Secondary | ICD-10-CM

## 2017-02-25 NOTE — Progress Notes (Addendum)
Patient ID: Elijah Walker, male   DOB: [birth year], 66 y.o.   MRN: [id number] The patient is here for annual Medicare wellness examination and management of other chronic and acute problems.   The risk factors are reflected in the social history.  The roster of all physicians providing medical care to patient - sees a dentist and eye doctor regularly.    Activities of daily living:  The patient is 100% independent in all ADLs: dressing, toileting, feeding as well as independent mobility  Home safety : The patient has smoke detectors in the home. He  wears seatbelts.  There are no firearms at home. There is no violence in the home.   There is no risks for hepatitis, STDs or HIV. There is no history of blood transfusion. They have no travel history to infectious disease endemic areas of the world.  The patient has seen their dentist in the last six month. They have seen their eye doctor in the last year. He states he has to occasionally as people to speak up or repeat themselves. Occasionally has trouble hearing when there is a lot of background noise or high pitch sounds.  He does not  have excessive sun exposure.   Diet: the importance of a healthy diet is discussed. He does have a healthy diet.  The benefits of regular aerobic exercise were discussed.  He exercises regularly.  Exercises 3 miles per day 5 days per week.    Depression screen: there are no signs or vegative symptoms of depression- irritability, change in appetite, anhedonia, sadness/tearfullness.  Cognitive assessment: the patient manages all their financial and personal affairs and is actively engaged. They could relate day,date,year and events; recalled 2/3 objects at 3 minutes; performed clock-face test normally.  The following portions of the patient's history were reviewed and updated as appropriate: allergies, current medications, past family history, past medical history,  past surgical history, past social history  and  problem list.  Visual acuity was not assessed since he has regular follow up with his ophthalmologist. Body mass index were assessed and reviewed.   During the course of the visit the patient was educated and counseled about appropriate screening and preventive services including : fall prevention , diabetes screening, nutrition counseling, colorectal cancer screening, and recommended immunizations.    Also, called pt and discussed the importance of a living will.  He and his wife have been discussing this recently.  Also discussed the importance of naming a health care power of attorney and a legal power of attorney.       Subjective:    Patient ID: Elijah Walker, male    DOB: 1951-05-05, 66 y.o.   MRN: [id number]  HPI  Along with his wellness exam, he is also here for a physical.  He reports he is doing well and feels good.  No chest pain.  No sob.  No acid reflux.  No abdominal pain.  Bowels moving.  No urine change.  Exercises regularly as outlined.  States that he suffered a shoulder strain - left shoulder - 5 years ago.  Over the last 6 months, has worsened.  Notices increased pain when trying to lift something.  Limited by pain.  Blood pressures on outside checks have been averaging 100-130s/60-80s.     Past Medical History:  Diagnosis Date  . Blood in stool    H/O  . History of chicken pox   . Hyperlipidemia    Past Surgical History:  Procedure Laterality Date  .  CHOLECYSTECTOMY  1998   Family History  Problem Relation Age of Onset  . Diabetes Father    Social History   Social History  . Marital status: Married    Spouse name: N/A  . Number of children: N/A  . Years of education: N/A   Social History Main Topics  . Smoking status: Never Smoker  . Smokeless tobacco: Never Used  . Alcohol use No  . Drug use: No  . Sexual activity: Not Asked   Other Topics Concern  . None   Social History Narrative  . None    Outpatient Encounter Prescriptions as of  02/25/2017  Medication Sig  . Cholecalciferol (VITAMIN D-3 PO) Take by mouth.  . Omega-3 Fatty Acids (OMEGA 3 PO) Take by mouth 4 (four) times daily.   No facility-administered encounter medications on file as of 02/25/2017.     Review of Systems  Constitutional: Negative for appetite change and unexpected weight change.  HENT: Negative for congestion and sinus pressure.   Eyes: Negative for pain and visual disturbance.  Respiratory: Negative for cough, chest tightness and shortness of breath.   Cardiovascular: Negative for chest pain, palpitations and leg swelling.  Gastrointestinal: Negative for abdominal pain, diarrhea, nausea and vomiting.  Genitourinary: Negative for difficulty urinating and dysuria.  Musculoskeletal: Negative for back pain and joint swelling.  Skin: Negative for color change and rash.  Neurological: Negative for dizziness, light-headedness and headaches.  Hematological: Negative for adenopathy. Does not bruise/bleed easily.  Psychiatric/Behavioral: Negative for agitation and dysphoric mood.       Objective:    Physical Exam  Constitutional: He is oriented to person, place, and time. He appears well-developed and well-nourished. No distress.  HENT:  Head: Normocephalic and atraumatic.  Nose: Nose normal.  Mouth/Throat: Oropharynx is clear and moist. No oropharyngeal exudate.  Eyes: Conjunctivae are normal. Right eye exhibits no discharge. Left eye exhibits no discharge.  Neck: Neck supple. No thyromegaly present.  Cardiovascular: Normal rate and regular rhythm.   Pulmonary/Chest: Breath sounds normal. No respiratory distress. He has no wheezes.  Abdominal: Soft. Bowel sounds are normal. There is no tenderness.  Genitourinary:  Genitourinary Comments: Rectal exam - no palpable prostate nodule.  Heme negative.    Musculoskeletal: He exhibits no edema or tenderness.  Increased pain with full extension.  No weakness of the upper extremity.    Lymphadenopathy:      He has no cervical adenopathy.  Neurological: He is alert and oriented to person, place, and time.  Skin: Skin is warm and dry. No rash noted. No erythema.  Psychiatric: He has a normal mood and affect. His behavior is normal.    BP 134/80 (BP Location: Left Arm, Patient Position: Sitting, Cuff Size: Normal)   Pulse 74   Temp 98.7 F (37.1 C) (Oral)   Resp 14   Ht 5' 8.5" (1.74 m)   Wt 161 lb 3.2 oz (73.1 kg)   SpO2 97%   BMI 24.15 kg/m  Wt Readings from Last 3 Encounters:  02/25/17 161 lb 3.2 oz (73.1 kg)  12/04/16 159 lb 9.6 oz (72.4 kg)  07/20/16 162 lb 6.4 oz (73.7 kg)     Lab Results  Component Value Date   WBC 4.0 09/01/2016   HGB 15.9 09/01/2016   HCT 47.5 09/01/2016   PLT 203.0 09/01/2016   GLUCOSE 98 12/23/2016   CHOL 218 (H) 12/23/2016   TRIG 131.0 12/23/2016   HDL 41.60 12/23/2016   LDLCALC 150 (H) 12/23/2016  ALT 26 12/23/2016   AST 17 12/23/2016   NA 142 12/23/2016   K 4.4 12/23/2016   CL 106 12/23/2016   CREATININE 1.11 12/23/2016   BUN 12 12/23/2016   CO2 30 12/23/2016   TSH 1.78 12/23/2016   PSA 1.61 07/21/2016   INR 1.0 10/05/2013   HGBA1C 5.5 12/23/2016   MICROALBUR <0.7 07/20/2016    Ct Chest W Contrast  Result Date: 08/07/2015 CLINICAL DATA:  Pulmonary nodule. EXAM: CT CHEST WITH CONTRAST TECHNIQUE: Multidetector CT imaging of the chest was performed during intravenous contrast administration. CONTRAST:  31m OMNIPAQUE IOHEXOL 300 MG/ML  SOLN COMPARISON:  Outside CT abdomen pelvis 10/18/2013. FINDINGS: Mediastinum/Nodes: No pathologically enlarged mediastinal, hilar or axillary lymph nodes. Coronary artery calcification. Heart size normal. No pericardial effusion. Lungs/Pleura: Smoothly marginated nodule in the medial right lower lobe measures 1.2 x 1.7 cm, has central calcification and is unchanged from 10/18/2013. Lungs are otherwise clear. No pleural fluid. Airway is unremarkable. Upper abdomen: Visualized portions of the liver, adrenal  glands, kidneys, spleen, pancreas, stomach and bowel are grossly unremarkable. No upper abdominal adenopathy. Cholecystectomy. Musculoskeletal: No worrisome lytic or sclerotic lesions. Degenerative changes are seen in the spine. IMPRESSION: 1. Right lower lobe nodule is smoothly marginated, has central calcification and is unchanged from 10/18/2013, rendering it benign. 2. Coronary artery calcification. Electronically Signed   By: MLorin PicketM.D.   On: 08/07/2015 10:19       Assessment & Plan:   Problem List Items Addressed This Visit    Health care maintenance    Physical today 02/25/17.  Check psa.  Colonoscopy 10/31/13.        Hypercholesterolemia    Off pravastatin.  Wanted to work on diet and exercise.  Follow lipid panel.        Relevant Orders   Hepatic function panel   Lipid panel   Hyperglycemia    Low carb diet and exercise.  Follow met b and a1c.        Relevant Orders   Hemoglobin AT0Y  Basic metabolic panel   Left shoulder pain    Persistent pain.  Check xray.  Further w/up pending.  May need ortho referral. No neck pain.        Relevant Orders   DG Shoulder Left (Completed)   Lung nodule    CT scan revealed lung nodule unchanged from 2015.          I spent 25 minutes with the patient and more than 50% of the time was spent in consultation regarding the above.  Time spent obtaining history and discussing current concerns.  Time also spent discussing treatment options and plan for further evaluation.     SEinar Pheasant MD

## 2017-02-25 NOTE — Progress Notes (Signed)
Pre-visit discussion using our clinic review tool. No additional management support is needed unless otherwise documented below in the visit note.  

## 2017-02-26 ENCOUNTER — Encounter: Payer: Self-pay | Admitting: Internal Medicine

## 2017-02-26 DIAGNOSIS — M25512 Pain in left shoulder: Secondary | ICD-10-CM

## 2017-02-26 NOTE — Telephone Encounter (Signed)
Order placed for ortho referral.   

## 2017-02-28 ENCOUNTER — Encounter: Payer: Self-pay | Admitting: Internal Medicine

## 2017-02-28 NOTE — Assessment & Plan Note (Signed)
Persistent pain.  Check xray.  Further w/up pending.  May need ortho referral. No neck pain.

## 2017-02-28 NOTE — Assessment & Plan Note (Signed)
Low carb diet and exercise.  Follow met b and a1c.   

## 2017-02-28 NOTE — Assessment & Plan Note (Signed)
Off pravastatin.  Wanted to work on diet and exercise.  Follow lipid panel.

## 2017-02-28 NOTE — Assessment & Plan Note (Signed)
CT scan revealed lung nodule unchanged from 2015.

## 2017-02-28 NOTE — Assessment & Plan Note (Signed)
Physical today 02/25/17.  Check psa.  Colonoscopy 10/31/13.

## 2017-04-27 ENCOUNTER — Encounter: Payer: Self-pay | Admitting: Internal Medicine

## 2017-04-30 ENCOUNTER — Other Ambulatory Visit (INDEPENDENT_AMBULATORY_CARE_PROVIDER_SITE_OTHER): Payer: PPO

## 2017-04-30 DIAGNOSIS — R739 Hyperglycemia, unspecified: Secondary | ICD-10-CM

## 2017-04-30 DIAGNOSIS — E7800 Pure hypercholesterolemia, unspecified: Secondary | ICD-10-CM | POA: Diagnosis not present

## 2017-04-30 LAB — BASIC METABOLIC PANEL
BUN: 13 mg/dL (ref 6–23)
CHLORIDE: 103 meq/L (ref 96–112)
CO2: 30 meq/L (ref 19–32)
Calcium: 9.6 mg/dL (ref 8.4–10.5)
Creatinine, Ser: 1.15 mg/dL (ref 0.40–1.50)
GFR: 67.66 mL/min (ref >60.00)
GLUCOSE: 102 mg/dL — AB (ref 70–99)
Potassium: 4.9 mEq/L (ref 3.5–5.1)
SODIUM: 140 meq/L (ref 135–145)

## 2017-04-30 LAB — HEPATIC FUNCTION PANEL
ALBUMIN: 4.4 g/dL (ref 3.5–5.2)
ALT: 23 U/L (ref 0–53)
AST: 20 U/L (ref 0–37)
Alkaline Phosphatase: 64 U/L (ref 39–117)
Bilirubin, Direct: 0.1 mg/dL (ref 0.0–0.3)
Total Bilirubin: 1.2 mg/dL (ref 0.2–1.2)
Total Protein: 6.9 g/dL (ref 6.0–8.3)

## 2017-04-30 LAB — LIPID PANEL
CHOLESTEROL: 209 mg/dL — AB (ref 0–200)
HDL: 46.2 mg/dL (ref >39.00)
LDL Cholesterol: 143 mg/dL — ABNORMAL HIGH (ref 0–99)
NonHDL: 162.74
Total CHOL/HDL Ratio: 5
Triglycerides: 98 mg/dL (ref 0.0–149.0)
VLDL: 19.6 mg/dL (ref 0.0–40.0)

## 2017-04-30 LAB — HEMOGLOBIN A1C: HEMOGLOBIN A1C: 5.7 % (ref 4.6–6.5)

## 2017-05-03 ENCOUNTER — Encounter: Payer: Self-pay | Admitting: Internal Medicine

## 2017-05-03 NOTE — Telephone Encounter (Signed)
See his my chart message and my result note to you.  Please call him and let him no my response to his labs.

## 2017-06-13 ENCOUNTER — Encounter: Payer: Self-pay | Admitting: Internal Medicine

## 2017-06-14 NOTE — Telephone Encounter (Signed)
Pt inquiring about ortho referral.  Referral in system.  Can you help with this?

## 2017-07-17 ENCOUNTER — Encounter: Payer: Self-pay | Admitting: Internal Medicine

## 2017-07-19 NOTE — Telephone Encounter (Signed)
Is this something he should contact ortho about? Looks like referral was placed and approved?

## 2017-07-30 ENCOUNTER — Encounter: Payer: Self-pay | Admitting: Internal Medicine

## 2017-08-02 DIAGNOSIS — M25512 Pain in left shoulder: Secondary | ICD-10-CM | POA: Diagnosis not present

## 2017-08-06 ENCOUNTER — Other Ambulatory Visit: Payer: Self-pay | Admitting: Orthopedic Surgery

## 2017-08-06 DIAGNOSIS — M25512 Pain in left shoulder: Secondary | ICD-10-CM

## 2017-08-23 ENCOUNTER — Other Ambulatory Visit: Payer: Self-pay | Admitting: Orthopedic Surgery

## 2017-08-23 ENCOUNTER — Ambulatory Visit
Admission: RE | Admit: 2017-08-23 | Discharge: 2017-08-23 | Disposition: A | Discharge status: Home / Self Care | Payer: PPO | Source: Ambulatory Visit | Attending: Orthopedic Surgery | Admitting: Orthopedic Surgery

## 2017-08-23 DIAGNOSIS — R937 Abnormal findings on diagnostic imaging of other parts of musculoskeletal system: Secondary | ICD-10-CM | POA: Diagnosis not present

## 2017-08-23 DIAGNOSIS — M25512 Pain in left shoulder: Secondary | ICD-10-CM

## 2017-08-23 DIAGNOSIS — S43432A Superior glenoid labrum lesion of left shoulder, initial encounter: Secondary | ICD-10-CM | POA: Diagnosis not present

## 2017-08-23 DIAGNOSIS — X58XXXA Exposure to other specified factors, initial encounter: Secondary | ICD-10-CM | POA: Insufficient documentation

## 2017-08-23 MED ORDER — LIDOCAINE HCL (PF) 1 % IJ SOLN
10.0000 mL | Freq: Once | INTRAMUSCULAR | Status: AC
  Administered 2017-08-23: 10 mL
  Filled 2017-08-23: qty 10

## 2017-08-23 MED ORDER — GADOBENATE DIMEGLUMINE 529 MG/ML IV SOLN
0.1000 mL | Freq: Once | INTRAVENOUS | Status: AC | PRN
  Administered 2017-08-23: 0.1 mL via INTRA_ARTICULAR

## 2017-08-23 MED ORDER — IOPAMIDOL (ISOVUE-200) INJECTION 41%
15.0000 mL | Freq: Once | INTRAVENOUS | Status: AC | PRN
  Administered 2017-08-23: 15 mL
  Filled 2017-08-23: qty 50

## 2017-08-23 NOTE — Progress Notes (Signed)
Patient ID: Elijah Walker, male   DOB: 07-26-1950, 67 y.o.   MRN: [id number] Order checked.  Procedure and associated risks reviewed with pt.  Consent obtained and time out performed.  Under sterile technique, 22g spinal needle advanced into left shoulder joint and 12 cc of mixture (15cc Isovue 200, 5 cc 1% Xylocaine and 0.1cc Multihance) instilled.  No complications.

## 2017-08-27 ENCOUNTER — Other Ambulatory Visit: Payer: Self-pay | Admitting: Orthopedic Surgery

## 2017-08-27 ENCOUNTER — Encounter: Payer: Self-pay | Admitting: Internal Medicine

## 2017-08-27 DIAGNOSIS — S43432A Superior glenoid labrum lesion of left shoulder, initial encounter: Secondary | ICD-10-CM | POA: Diagnosis not present

## 2017-08-27 DIAGNOSIS — S43422A Sprain of left rotator cuff capsule, initial encounter: Secondary | ICD-10-CM | POA: Diagnosis not present

## 2017-08-27 DIAGNOSIS — M25512 Pain in left shoulder: Secondary | ICD-10-CM | POA: Diagnosis not present

## 2017-08-27 NOTE — Telephone Encounter (Signed)
Will need appt for pre op evaluation.

## 2017-08-30 ENCOUNTER — Encounter: Payer: Self-pay | Admitting: Internal Medicine

## 2017-08-30 ENCOUNTER — Ambulatory Visit (INDEPENDENT_AMBULATORY_CARE_PROVIDER_SITE_OTHER): Payer: PPO | Admitting: Internal Medicine

## 2017-08-30 VITALS — BP 134/76 | HR 58 | Temp 98.1°F | Resp 16 | Ht 67.5 in | Wt 159.0 lb

## 2017-08-30 DIAGNOSIS — R030 Elevated blood-pressure reading, without diagnosis of hypertension: Secondary | ICD-10-CM | POA: Diagnosis not present

## 2017-08-30 DIAGNOSIS — M25512 Pain in left shoulder: Secondary | ICD-10-CM | POA: Diagnosis not present

## 2017-08-30 DIAGNOSIS — Z01818 Encounter for other preprocedural examination: Secondary | ICD-10-CM

## 2017-08-30 DIAGNOSIS — D649 Anemia, unspecified: Secondary | ICD-10-CM | POA: Diagnosis not present

## 2017-08-30 LAB — CBC WITH DIFFERENTIAL/PLATELET
BASOS ABS: 0 10*3/uL (ref 0.0–0.1)
Basophils Relative: 0.8 % (ref 0.0–3.0)
EOS ABS: 0.2 10*3/uL (ref 0.0–0.7)
Eosinophils Relative: 4.1 % (ref 0.0–5.0)
HCT: 46 % (ref 39.0–52.0)
Hemoglobin: 15.5 g/dL (ref 13.0–17.0)
LYMPHS ABS: 1.8 10*3/uL (ref 0.7–4.0)
LYMPHS PCT: 41.2 % (ref 12.0–46.0)
MCHC: 33.7 g/dL (ref 30.0–36.0)
MCV: 95.4 fl (ref 78.0–100.0)
MONO ABS: 0.4 10*3/uL (ref 0.1–1.0)
Monocytes Relative: 8.4 % (ref 3.0–12.0)
NEUTROS PCT: 45.5 % (ref 43.0–77.0)
Neutro Abs: 2 10*3/uL (ref 1.4–7.7)
Platelets: 215 10*3/uL (ref 150.0–400.0)
RBC: 4.82 Mil/uL (ref 4.22–5.81)
RDW: 13.2 % (ref 11.5–15.5)
WBC: 4.3 10*3/uL (ref 4.0–10.5)

## 2017-08-30 LAB — PROTIME-INR
INR: 1 ratio (ref 0.8–1.0)
PROTHROMBIN TIME: 10.7 s (ref 9.6–13.1)

## 2017-08-30 LAB — BASIC METABOLIC PANEL
BUN: 14 mg/dL (ref 6–23)
CALCIUM: 9.1 mg/dL (ref 8.4–10.5)
CO2: 30 mEq/L (ref 19–32)
Chloride: 105 mEq/L (ref 96–112)
Creatinine, Ser: 1.03 mg/dL (ref 0.40–1.50)
GFR: 76.76 mL/min (ref >60.00)
Glucose, Bld: 105 mg/dL — ABNORMAL HIGH (ref 70–99)
Potassium: 4.3 mEq/L (ref 3.5–5.1)
SODIUM: 141 meq/L (ref 135–145)

## 2017-08-30 LAB — APTT: aPTT: 28.6 s (ref 23.4–32.7)

## 2017-08-30 NOTE — Assessment & Plan Note (Addendum)
Surgery scheduled to have cbc drawn.  Wants labs drawn today.

## 2017-08-30 NOTE — Assessment & Plan Note (Signed)
Persistent pain.  Seeing ortho.  MRI as outlined.  Planning for surgery 09/14/17.  See pre op recommendations.

## 2017-08-30 NOTE — Assessment & Plan Note (Signed)
He is very active with no cardiac symptoms.  EKG - SR with no acute ischemic changes.  I feel he is a low risk from a cardiac standpoint to proceed with the planned surgery.  He will need close intra op and post op monitoring of his heart rate and blood pressure to avoid extremes.

## 2017-08-30 NOTE — Assessment & Plan Note (Signed)
Blood pressures have been under good control on outside checks.  Checks here as outlined.  Check metabolic panel.  Hold on medication.  Follow pressures.

## 2017-08-30 NOTE — Progress Notes (Signed)
Patient ID: Elijah Walker, male   DOB: Aug 09, 1950, 67 y.o.   MRN: [id number]   Subjective:    Patient ID: Elijah Walker, male    DOB: [birth year], 67 y.o.   MRN: [id number]  HPI  Patient here for as a work in for pre op evaluation.  He is planning to have shoulder surgery 09/14/17.  States he is doing relatively well.  Is active.  When he exercises, he has experienced no chest pain.  No sob.  Has had some low back/right gluteus discomfort over the last few weeks.  Intermittent.  Getting better.  No radicular symptoms.  Desires no further w/up.  Overall he feels he is doing relatively well.  Some increased stress.  Overall feels he is handling things relatively well.     Past Medical History:  Diagnosis Date  . Blood in stool    H/O  . History of chicken pox   . Hyperlipidemia    Past Surgical History:  Procedure Laterality Date  . CHOLECYSTECTOMY  1998   Family History  Problem Relation Age of Onset  . Diabetes Father    Social History   Socioeconomic History  . Marital status: Married    Spouse name: None  . Number of children: None  . Years of education: None  . Highest education level: None  Social Needs  . Financial resource strain: None  . Food insecurity - worry: None  . Food insecurity - inability: None  . Transportation needs - medical: None  . Transportation needs - non-medical: None  Occupational History  . None  Tobacco Use  . Smoking status: Never Smoker  . Smokeless tobacco: Never Used  Substance and Sexual Activity  . Alcohol use: No    Alcohol/week: 0.0 oz  . Drug use: No  . Sexual activity: None  Other Topics Concern  . None  Social History Narrative  . None    Outpatient Encounter Medications as of 08/30/2017  Medication Sig  . Cholecalciferol (VITAMIN D-3 PO) Take by mouth.  . Omega-3 Fatty Acids (OMEGA 3 PO) Take by mouth 4 (four) times daily.   No facility-administered encounter medications on file as of 08/30/2017.     Review of Systems    Constitutional: Negative for appetite change and unexpected weight change.  HENT: Negative for congestion and sinus pressure.   Respiratory: Negative for cough, chest tightness and shortness of breath.   Cardiovascular: Negative for chest pain, palpitations and leg swelling.  Gastrointestinal: Negative for abdominal pain, diarrhea, nausea and vomiting.  Genitourinary: Negative for difficulty urinating and dysuria.  Musculoskeletal:       Persistent left shoulder pain.  Planning for surgery.  Low back pain as outlined.  No radicular symptoms.  Some stiffness right index finger.  Desires no further intervention.   Skin: Negative for color change and rash.  Neurological: Negative for dizziness, light-headedness and headaches.  Psychiatric/Behavioral: Negative for agitation and dysphoric mood.       Objective:    Physical Exam  Constitutional: He appears well-developed and well-nourished. No distress.  HENT:  Nose: Nose normal.  Eyes: Conjunctivae are normal. Right eye exhibits no discharge. Left eye exhibits no discharge.  Neck: Neck supple.  Cardiovascular: Normal rate and regular rhythm.  Pulmonary/Chest: Effort normal and breath sounds normal. No respiratory distress.  Abdominal: Soft. Bowel sounds are normal. There is no tenderness.  Musculoskeletal: He exhibits no edema or tenderness.  No pain in lower back with resistance against flexion.   Lymphadenopathy:  He has no cervical adenopathy.  Skin: No rash noted. No erythema.  Psychiatric: He has a normal mood and affect. His behavior is normal.    BP 134/76 (BP Location: Left Arm, Patient Position: Sitting, Cuff Size: Large)   Pulse (!) 58   Temp 98.1 F (36.7 C) (Oral)   Resp 16   Ht 5' 7.5" (1.715 m)   Wt 159 lb (72.1 kg)   SpO2 98%   BMI 24.54 kg/m  Wt Readings from Last 3 Encounters:  08/30/17 159 lb (72.1 kg)  02/25/17 161 lb 3.2 oz (73.1 kg)  12/04/16 159 lb 9.6 oz (72.4 kg)     Lab Results  Component  Value Date   WBC 4.3 08/30/2017   HGB 15.5 08/30/2017   HCT 46.0 08/30/2017   PLT 215.0 08/30/2017   GLUCOSE 105 (H) 08/30/2017   CHOL 209 (H) 04/30/2017   TRIG 98.0 04/30/2017   HDL 46.20 04/30/2017   LDLCALC 143 (H) 04/30/2017   ALT 23 04/30/2017   AST 20 04/30/2017   NA 141 08/30/2017   K 4.3 08/30/2017   CL 105 08/30/2017   CREATININE 1.03 08/30/2017   BUN 14 08/30/2017   CO2 30 08/30/2017   TSH 1.78 12/23/2016   PSA 1.61 07/21/2016   INR 1.0 08/30/2017   HGBA1C 5.7 04/30/2017   MICROALBUR <0.7 07/20/2016    Mr Shoulder Left W Contrast  Result Date: 08/23/2017 CLINICAL DATA:  Left shoulder pain with painful range of motion. EXAM: MR ARTHROGRAM OF THE LEFT SHOULDER TECHNIQUE: Multiplanar, multisequence MR imaging of the left shoulder was performed following the administration of intra-articular contrast. CONTRAST:  See injection procedure report COMPARISON:  Radiographs dated 02/25/2017 and 08/23/2017 FINDINGS: Rotator cuff: The long head of the biceps tendon is dislocated into the subscapularis tendon consistent with a least a partial thickness bursal surface tear of the subscapularis. Although no discrete full-thickness tear of the subscapularis is apparent, the literature suggests a high incidence of complete subscapularis tears even on negative MR arthrography. In this case, there is contrast in the subacromial/subdeltoid bursae consistent with a full-thickness tear of the rotator cuff. There is marked thinning of the anterior aspect of the distal supraspinatus tendon without a definitive tear. The infraspinatus and teres minor tendons are intact. Muscles: No atrophy or edema. Biceps long head: The biceps tendon is dislocated into the substance of the subscapularis tendon. Acromioclavicular Joint: Normal.  Type 2 acromion. Glenohumeral Joint: No chondral defect. Labrum: There is a SLAP tear extending posteriorly from 11 o'clock to at least 6 o'clock. Bones: Normal. IMPRESSION: 1.  Extensive SLAP tear extending from 11 o'clock to 6 o'clock. 2. Dislocation of the long head of the biceps tendon into the subscapularis tendon consistent with a partial-thickness bursal surface tear of the subscapularis. There is a high incidence of occult full-thickness tears of the subscapularis with this finding, even with MR arthrography. 3. Marked thinning of the anterior aspect of the distal supraspinatus tendon without a definitive tear. 4. There contrast seen in the subacromial/subdeltoid bursae highly suggestive of an occult full-thickness rotator cuff tear. Electronically Signed   By: Lorriane Shire M.D.   On: 08/23/2017 11:31   Dg Fluoro Guided Needle Plc Aspiration/injection Loc  Result Date: 08/23/2017 CLINICAL DATA:  67 year old male with left shoulder pain for the past 6 to 7 years after lifting object above head. Initial encounter. EXAM: LEFT SHOULDER INJECTION UNDER FLUOROSCOPY FLUOROSCOPY TIME:  Fluoroscopy Time:  1 minute and 6 seconds. Radiation Exposure Index:  6.9 mGy PROCEDURE: Procedure and potential associated complications discussed with the patient. Questions answered. Written consent obtained. Time-out performed. Under fluoroscopic guidance and aseptic technique, 22 gauge spinal needle advanced into the left shoulder joint. 12 cc of mixture of (15 cc Isovue 200, xylocaine 1% 5 cc and 0.1 cc MultiHance) instilled. Patient transported to MR. No immediate complications. Postprocedure instructions reviewed with patient. IMPRESSION: Technically successful left shoulder injection under fluoroscopy pre MRI. Electronically Signed   By: Genia Del M.D.   On: 08/23/2017 11:07       Assessment & Plan:   Problem List Items Addressed This Visit    Anemia    Surgery scheduled to have cbc drawn.  Wants labs drawn today.       Relevant Orders   CBC with Differential/Platelet (Completed)   Elevated blood pressure reading    Blood pressures have been under good control on outside  checks.  Checks here as outlined.  Check metabolic panel.  Hold on medication.  Follow pressures.        Relevant Orders   Basic metabolic panel (Completed)   Left shoulder pain    Persistent pain.  Seeing ortho.  MRI as outlined.  Planning for surgery 09/14/17.  See pre op recommendations.        Pre-op evaluation - Primary    He is very active with no cardiac symptoms.  EKG - SR with no acute ischemic changes.  I feel he is a low risk from a cardiac standpoint to proceed with the planned surgery.  He will need close intra op and post op monitoring of his heart rate and blood pressure to avoid extremes.        Relevant Orders   EKG 12-Lead (Completed)   Protime-INR (Completed)   PTT (Completed)       [name], MD

## 2017-08-31 ENCOUNTER — Encounter: Payer: Self-pay | Admitting: Internal Medicine

## 2017-09-02 ENCOUNTER — Ambulatory Visit: Payer: PPO | Admitting: Internal Medicine

## 2017-09-06 DIAGNOSIS — S43432A Superior glenoid labrum lesion of left shoulder, initial encounter: Secondary | ICD-10-CM | POA: Diagnosis not present

## 2017-09-07 ENCOUNTER — Encounter
Admission: RE | Admit: 2017-09-07 | Discharge: 2017-09-07 | Disposition: A | Discharge status: Home / Self Care | Payer: PPO | Source: Ambulatory Visit | Attending: Orthopedic Surgery | Admitting: Orthopedic Surgery

## 2017-09-07 ENCOUNTER — Other Ambulatory Visit: Payer: Self-pay

## 2017-09-07 DIAGNOSIS — Z01812 Encounter for preprocedural laboratory examination: Secondary | ICD-10-CM | POA: Insufficient documentation

## 2017-09-07 DIAGNOSIS — E785 Hyperlipidemia, unspecified: Secondary | ICD-10-CM | POA: Insufficient documentation

## 2017-09-07 NOTE — Patient Instructions (Signed)
Your procedure is scheduled on: Tuesday 09/14/17 Report to [facility]. To find out your arrival time please call [phone] between 1PM - 3PM on Monday 09/13/17.  Remember: Instructions that are not followed completely may result in serious medical risk, up to and including death, or upon the discretion of your surgeon and anesthesiologist your surgery may need to be rescheduled.     _X__ 1. Do not eat food after midnight the night before your procedure.                 No gum chewing or hard candies. You may drink clear liquids up to 2 hours                 before you are scheduled to arrive for your surgery- DO not drink clear                 liquids within 2 hours of the start of your surgery.                 Clear Liquids include:  water, apple juice without pulp, clear carbohydrate                 drink such as Clearfast of Gartorade, Black Coffee or Tea (Do not add                 anything to coffee or tea).  __X__2.  On the morning of surgery brush your teeth with toothpaste and water, you                 may rinse your mouth with mouthwash if you wish.  Do not swallow any              toothpaste of mouthwash.     _X__ 3.  No Alcohol for 24 hours before or after surgery.   _X__ 4.  Do Not Smoke or use e-cigarettes For 24 Hours Prior to Your Surgery.                 Do not use any chewable tobacco products for at least 6 hours prior to                 surgery.  ____  5.  Bring all medications with you on the day of surgery if instructed.   __X__  6.  Notify your doctor if there is any change in your medical condition      (cold, fever, infections).     Do not wear jewelry, make-up, hairpins, clips or nail polish. Do not wear lotions, powders, or perfumes. You may wear deodorant. Do not shave 48 hours prior to surgery. Men may shave face and neck. Do not bring valuables to the hospital.    [facility] is not responsible for  any belongings or valuables.  Contacts, dentures or bridgework may not be worn into surgery. Leave your suitcase in the car. After surgery it may be brought to your room. For patients admitted to the hospital, discharge time is determined by your treatment team.   Patients discharged the day of surgery will not be allowed to drive home.   Please read over the following fact sheets that you were given:   MRSA Information   ____ Take these medicines the morning of surgery with A SIP OF WATER:    1. NONE  2.   3.   4.  5.  6.  ____ Fleet Enema (as directed)   __X__ Use CHG Soap as directed  ____ Use inhalers on the day of surgery  ____ Stop metformin/Janumet/Farxiga 2 days prior to surgery    ____ Take 1/2 of usual insulin dose the night before surgery. No insulin the morning          of surgery.   ____ Stop Blood Thinners Coumadin/Plavix/Xarelto/Pleta/Pradaxa/Eliquis/Effient/Aspirin  on   __X__ Stop Anti-inflammatories such as Advil, Ibuprofen, Motrin, BC or Goodies  Powder, Naprosyn, Naproxen, Aleve    __X__ Stop herbal supplements, fish oil or vitamin E until after surgery.  YOUR VITAMIN D IS OK TO CONTINUE  ____ Bring C-Pap to the hospital.

## 2017-09-08 ENCOUNTER — Telehealth: Payer: Self-pay

## 2017-09-08 DIAGNOSIS — Z01818 Encounter for other preprocedural examination: Secondary | ICD-10-CM

## 2017-09-08 NOTE — Telephone Encounter (Signed)
Orders placed for remainder of pre op labs.

## 2017-09-08 NOTE — Telephone Encounter (Signed)
Patient has been scheduled for additional labs tomorrow morning at 10:30

## 2017-09-09 ENCOUNTER — Other Ambulatory Visit (INDEPENDENT_AMBULATORY_CARE_PROVIDER_SITE_OTHER): Payer: PPO

## 2017-09-09 DIAGNOSIS — Z01818 Encounter for other preprocedural examination: Secondary | ICD-10-CM

## 2017-09-09 LAB — URINALYSIS, ROUTINE W REFLEX MICROSCOPIC
BILIRUBIN URINE: NEGATIVE
Hgb urine dipstick: NEGATIVE
KETONES UR: NEGATIVE
LEUKOCYTES UA: NEGATIVE
Nitrite: NEGATIVE
PH: 7 (ref 5.0–8.0)
RBC / HPF: NONE SEEN (ref 0–?)
Specific Gravity, Urine: 1.02 (ref 1.000–1.030)
Total Protein, Urine: NEGATIVE
UROBILINOGEN UA: 0.2 (ref 0.0–1.0)
Urine Glucose: NEGATIVE

## 2017-09-09 LAB — HEMOGLOBIN A1C: Hgb A1c MFr Bld: 5.8 % (ref 4.6–6.5)

## 2017-09-09 LAB — ALBUMIN: Albumin: 4.2 g/dL (ref 3.5–5.2)

## 2017-09-10 ENCOUNTER — Encounter: Payer: Self-pay | Admitting: Internal Medicine

## 2017-09-10 ENCOUNTER — Telehealth: Payer: Self-pay | Admitting: Internal Medicine

## 2017-09-10 NOTE — Telephone Encounter (Signed)
Left message to let patient knw paperwork has been faxed.

## 2017-09-10 NOTE — Telephone Encounter (Signed)
Copied from [place]. Topic: Quick Communication - See Telephone Encounter >> Sep 10, 2017 11:26 AM Robina Ade, Helene Kelp D wrote: CRM for notification. See Telephone encounter for: 09/10/17. Patient called and said that his orthopedic doctor has not yet received the medical clearance paper to clear him for surgery. Please call patient with a update, thanks.

## 2017-09-13 MED ORDER — CEFAZOLIN SODIUM-DEXTROSE 2-4 GM/100ML-% IV SOLN
2.0000 g | INTRAVENOUS | Status: AC
  Administered 2017-09-14: 2 g via INTRAVENOUS

## 2017-09-14 ENCOUNTER — Ambulatory Visit
Admission: RE | Admit: 2017-09-14 | Discharge: 2017-09-14 | Disposition: A | Discharge status: Home / Self Care | Payer: PPO | Source: Ambulatory Visit | Attending: Orthopedic Surgery | Admitting: Orthopedic Surgery

## 2017-09-14 ENCOUNTER — Other Ambulatory Visit: Payer: Self-pay

## 2017-09-14 ENCOUNTER — Encounter: Payer: Self-pay | Admitting: *Deleted

## 2017-09-14 ENCOUNTER — Ambulatory Visit: Payer: PPO | Admitting: Anesthesiology

## 2017-09-14 ENCOUNTER — Encounter
Admission: RE | Disposition: A | Discharge status: Home / Self Care | Payer: Self-pay | Source: Ambulatory Visit | Attending: Orthopedic Surgery

## 2017-09-14 DIAGNOSIS — M25512 Pain in left shoulder: Secondary | ICD-10-CM | POA: Diagnosis not present

## 2017-09-14 DIAGNOSIS — M75102 Unspecified rotator cuff tear or rupture of left shoulder, not specified as traumatic: Secondary | ICD-10-CM | POA: Diagnosis not present

## 2017-09-14 DIAGNOSIS — G8918 Other acute postprocedural pain: Secondary | ICD-10-CM | POA: Diagnosis not present

## 2017-09-14 DIAGNOSIS — M75122 Complete rotator cuff tear or rupture of left shoulder, not specified as traumatic: Secondary | ICD-10-CM | POA: Diagnosis not present

## 2017-09-14 DIAGNOSIS — X58XXXA Exposure to other specified factors, initial encounter: Secondary | ICD-10-CM | POA: Diagnosis not present

## 2017-09-14 DIAGNOSIS — Z9049 Acquired absence of other specified parts of digestive tract: Secondary | ICD-10-CM | POA: Insufficient documentation

## 2017-09-14 DIAGNOSIS — M19012 Primary osteoarthritis, left shoulder: Secondary | ICD-10-CM | POA: Insufficient documentation

## 2017-09-14 DIAGNOSIS — M7542 Impingement syndrome of left shoulder: Secondary | ICD-10-CM | POA: Diagnosis not present

## 2017-09-14 DIAGNOSIS — S43432A Superior glenoid labrum lesion of left shoulder, initial encounter: Secondary | ICD-10-CM | POA: Diagnosis not present

## 2017-09-14 HISTORY — PX: SHOULDER ARTHROSCOPY WITH BANKART REPAIR: SHX5673

## 2017-09-14 SURGERY — SHOULDER ARTHROSCOPY WITH BANKART REPAIR
Anesthesia: General | Site: Shoulder | Laterality: Left | Wound class: Clean

## 2017-09-14 MED ORDER — NEOMYCIN-POLYMYXIN B GU 40-200000 IR SOLN
Status: AC
  Filled 2017-09-14: qty 2

## 2017-09-14 MED ORDER — FENTANYL CITRATE (PF) 100 MCG/2ML IJ SOLN
INTRAMUSCULAR | Status: AC
  Administered 2017-09-14: 50 ug via INTRAVENOUS
  Filled 2017-09-14: qty 2

## 2017-09-14 MED ORDER — EPINEPHRINE PF 1 MG/ML IJ SOLN
INTRAMUSCULAR | Status: DC | PRN
  Administered 2017-09-14: 16 mL

## 2017-09-14 MED ORDER — ONDANSETRON HCL 4 MG/2ML IJ SOLN
INTRAMUSCULAR | Status: AC
  Filled 2017-09-14: qty 2

## 2017-09-14 MED ORDER — FAMOTIDINE 20 MG PO TABS
ORAL_TABLET | ORAL | Status: AC
  Administered 2017-09-14: 20 mg via ORAL
  Filled 2017-09-14: qty 1

## 2017-09-14 MED ORDER — BUPIVACAINE HCL (PF) 0.5 % IJ SOLN
INTRAMUSCULAR | Status: DC | PRN
  Administered 2017-09-14: 10 mL via PERINEURAL

## 2017-09-14 MED ORDER — BUPIVACAINE LIPOSOME 1.3 % IJ SUSP
INTRAMUSCULAR | Status: DC | PRN
  Administered 2017-09-14: 20 mL via PERINEURAL

## 2017-09-14 MED ORDER — FENTANYL CITRATE (PF) 250 MCG/5ML IJ SOLN
INTRAMUSCULAR | Status: AC
  Filled 2017-09-14: qty 5

## 2017-09-14 MED ORDER — SUGAMMADEX SODIUM 200 MG/2ML IV SOLN
INTRAVENOUS | Status: AC
  Filled 2017-09-14: qty 2

## 2017-09-14 MED ORDER — LIDOCAINE HCL (PF) 1 % IJ SOLN
INTRAMUSCULAR | Status: AC
  Filled 2017-09-14: qty 5

## 2017-09-14 MED ORDER — BUPIVACAINE HCL (PF) 0.25 % IJ SOLN
INTRAMUSCULAR | Status: AC
  Filled 2017-09-14: qty 30

## 2017-09-14 MED ORDER — CEFAZOLIN SODIUM-DEXTROSE 2-4 GM/100ML-% IV SOLN
INTRAVENOUS | Status: AC
  Filled 2017-09-14: qty 100

## 2017-09-14 MED ORDER — ROCURONIUM BROMIDE 100 MG/10ML IV SOLN
INTRAVENOUS | Status: DC | PRN
  Administered 2017-09-14: 80 mg via INTRAVENOUS

## 2017-09-14 MED ORDER — BUPIVACAINE HCL (PF) 0.25 % IJ SOLN
INTRAMUSCULAR | Status: DC | PRN
  Administered 2017-09-14: 30 mL

## 2017-09-14 MED ORDER — LIDOCAINE HCL (CARDIAC) 20 MG/ML IV SOLN
INTRAVENOUS | Status: DC | PRN
  Administered 2017-09-14: 100 mg via INTRAVENOUS

## 2017-09-14 MED ORDER — DEXAMETHASONE SODIUM PHOSPHATE 10 MG/ML IJ SOLN
INTRAMUSCULAR | Status: AC
  Filled 2017-09-14: qty 1

## 2017-09-14 MED ORDER — ONDANSETRON HCL 4 MG/2ML IJ SOLN
INTRAMUSCULAR | Status: DC | PRN
  Administered 2017-09-14: 4 mg via INTRAVENOUS

## 2017-09-14 MED ORDER — PHENYLEPHRINE HCL 10 MG/ML IJ SOLN
INTRAMUSCULAR | Status: AC
  Filled 2017-09-14: qty 1

## 2017-09-14 MED ORDER — SODIUM CHLORIDE 0.9 % IR SOLN
Status: DC | PRN
  Administered 2017-09-14: 09:00:00

## 2017-09-14 MED ORDER — LIDOCAINE HCL (PF) 2 % IJ SOLN
INTRAMUSCULAR | Status: AC
  Filled 2017-09-14: qty 10

## 2017-09-14 MED ORDER — MIDAZOLAM HCL 2 MG/2ML IJ SOLN
2.0000 mg | Freq: Once | INTRAMUSCULAR | Status: AC
  Administered 2017-09-14: 2 mg via INTRAVENOUS

## 2017-09-14 MED ORDER — EPINEPHRINE 30 MG/30ML IJ SOLN
INTRAMUSCULAR | Status: AC
  Filled 2017-09-14: qty 1

## 2017-09-14 MED ORDER — PHENYLEPHRINE HCL 10 MG/ML IJ SOLN
INTRAVENOUS | Status: DC | PRN
  Administered 2017-09-14: 30 ug/min via INTRAVENOUS

## 2017-09-14 MED ORDER — BUPIVACAINE LIPOSOME 1.3 % IJ SUSP
INTRAMUSCULAR | Status: AC
  Filled 2017-09-14: qty 20

## 2017-09-14 MED ORDER — DEXAMETHASONE SODIUM PHOSPHATE 4 MG/ML IJ SOLN
INTRAMUSCULAR | Status: DC | PRN
  Administered 2017-09-14: 5 mg via INTRAVENOUS

## 2017-09-14 MED ORDER — FENTANYL CITRATE (PF) 100 MCG/2ML IJ SOLN
INTRAMUSCULAR | Status: DC | PRN
  Administered 2017-09-14: 75 ug via INTRAVENOUS
  Administered 2017-09-14 (×2): 50 ug via INTRAVENOUS
  Administered 2017-09-14: 75 ug via INTRAVENOUS

## 2017-09-14 MED ORDER — NALOXONE HCL 0.4 MG/ML IJ SOLN
INTRAMUSCULAR | Status: DC | PRN
  Administered 2017-09-14: 80 ug via INTRAVENOUS

## 2017-09-14 MED ORDER — LACTATED RINGERS IV SOLN
INTRAVENOUS | Status: DC
  Administered 2017-09-14: 07:00:00 via INTRAVENOUS

## 2017-09-14 MED ORDER — LIDOCAINE HCL (PF) 1 % IJ SOLN
INTRAMUSCULAR | Status: DC | PRN
  Administered 2017-09-14: .8 mL via SUBCUTANEOUS

## 2017-09-14 MED ORDER — MIDAZOLAM HCL 2 MG/2ML IJ SOLN: 1.0000 mg | Freq: Once | INTRAMUSCULAR | Status: DC

## 2017-09-14 MED ORDER — FENTANYL CITRATE (PF) 100 MCG/2ML IJ SOLN
50.0000 ug | Freq: Once | INTRAMUSCULAR | Status: AC
  Administered 2017-09-14: 50 ug via INTRAVENOUS

## 2017-09-14 MED ORDER — ONDANSETRON HCL 4 MG PO TABS: 4.0000 mg | ORAL_TABLET | Freq: Three times a day (TID) | ORAL | 0 refills | Status: DC | PRN

## 2017-09-14 MED ORDER — OXYCODONE HCL 5 MG PO TABS: 5.0000 mg | ORAL_TABLET | ORAL | 0 refills | Status: DC | PRN

## 2017-09-14 MED ORDER — EPINEPHRINE PF 1 MG/ML IJ SOLN
INTRAMUSCULAR | Status: AC
  Filled 2017-09-14: qty 1

## 2017-09-14 MED ORDER — PROPOFOL 10 MG/ML IV BOLUS
INTRAVENOUS | Status: AC
  Filled 2017-09-14: qty 20

## 2017-09-14 MED ORDER — SUGAMMADEX SODIUM 200 MG/2ML IV SOLN
INTRAVENOUS | Status: DC | PRN
  Administered 2017-09-14: 200 mg via INTRAVENOUS

## 2017-09-14 MED ORDER — PROPOFOL 10 MG/ML IV BOLUS
INTRAVENOUS | Status: DC | PRN
  Administered 2017-09-14: 190 mg via INTRAVENOUS

## 2017-09-14 MED ORDER — FAMOTIDINE 20 MG PO TABS
20.0000 mg | ORAL_TABLET | Freq: Once | ORAL | Status: AC
  Administered 2017-09-14: 20 mg via ORAL

## 2017-09-14 MED ORDER — MIDAZOLAM HCL 2 MG/2ML IJ SOLN
INTRAMUSCULAR | Status: AC
  Administered 2017-09-14: 2 mg via INTRAVENOUS
  Filled 2017-09-14: qty 2

## 2017-09-14 MED ORDER — BUPIVACAINE HCL (PF) 0.5 % IJ SOLN
INTRAMUSCULAR | Status: AC
  Filled 2017-09-14: qty 10

## 2017-09-14 MED ORDER — ROCURONIUM BROMIDE 50 MG/5ML IV SOLN
INTRAVENOUS | Status: AC
  Filled 2017-09-14: qty 2

## 2017-09-14 SURGICAL SUPPLY — 70 items
ADAPTER IRRIG TUBE 2 SPIKE SOL (ADAPTER) ×4 IMPLANT
ANCHOR ALL-SUT Q-FIX 2.8 (Anchor) ×4 IMPLANT
ANCHOR SUT BIOC ST 3X145 (Anchor) ×4 IMPLANT
BUR RADIUS 4.0X18.5 (BURR) ×2 IMPLANT
BUR RADIUS 5.5 (BURR) ×2 IMPLANT
CANNULA 5.75X7 CRYSTAL CLEAR (CANNULA) ×4 IMPLANT
CANNULA PARTIAL THREAD 2X7 (CANNULA) ×2 IMPLANT
CANNULA TWIST IN 8.25X9CM (CANNULA) ×4 IMPLANT
CONNECTOR PERFECT PASSER (CONNECTOR) ×2 IMPLANT
COOLER POLAR GLACIER W/PUMP (MISCELLANEOUS) ×2 IMPLANT
CRADLE LAMINECT ARM (MISCELLANEOUS) ×2 IMPLANT
DRAPE IMP U-DRAPE 54X76 (DRAPES) ×4 IMPLANT
DRAPE INCISE IOBAN 66X45 STRL (DRAPES) ×2 IMPLANT
DRAPE SHEET LG 3/4 BI-LAMINATE (DRAPES) ×2 IMPLANT
DRAPE U-SHAPE 47X51 STRL (DRAPES) ×2 IMPLANT
DURAPREP 26ML APPLICATOR (WOUND CARE) ×8 IMPLANT
ELECT REM PT RETURN 9FT ADLT (ELECTROSURGICAL) ×2
ELECTRODE REM PT RTRN 9FT ADLT (ELECTROSURGICAL) ×1 IMPLANT
GAUZE PETRO XEROFOAM 1X8 (MISCELLANEOUS) ×2 IMPLANT
GAUZE SPONGE 4X4 12PLY STRL (GAUZE/BANDAGES/DRESSINGS) ×2 IMPLANT
GLOVE BIOGEL PI IND STRL 7.0 (GLOVE) ×6 IMPLANT
GLOVE BIOGEL PI IND STRL 9 (GLOVE) ×1 IMPLANT
GLOVE BIOGEL PI INDICATOR 7.0 (GLOVE) ×6
GLOVE BIOGEL PI INDICATOR 9 (GLOVE) ×1
GLOVE SURG 9.0 ORTHO LTXF (GLOVE) ×4 IMPLANT
GOWN STRL REUS TWL 2XL XL LVL4 (GOWN DISPOSABLE) ×2 IMPLANT
GOWN STRL REUS W/ TWL LRG LVL3 (GOWN DISPOSABLE) ×5 IMPLANT
GOWN STRL REUS W/TWL LRG LVL3 (GOWN DISPOSABLE) ×5
IV LACTATED RINGER IRRG 3000ML (IV SOLUTION) ×16
IV LR IRRIG 3000ML ARTHROMATIC (IV SOLUTION) ×16 IMPLANT
KIT STABILIZATION SHOULDER (MISCELLANEOUS) ×2 IMPLANT
KIT SUTURE 2.8 Q-FIX DISP (MISCELLANEOUS) ×2 IMPLANT
KIT SUTURETAK 3.0 INSERT PERC (KITS) ×2 IMPLANT
KIT TURNOVER KIT A (KITS) ×2 IMPLANT
MANIFOLD NEPTUNE II (INSTRUMENTS) ×2 IMPLANT
MASK FACE SPIDER DISP (MASK) ×2 IMPLANT
MAT BLUE FLOOR 46X72 FLO (MISCELLANEOUS) ×4 IMPLANT
NDL SAFETY ECLIPSE 18X1.5 (NEEDLE) ×1 IMPLANT
NEEDLE HYPO 18GX1.5 SHARP (NEEDLE) ×1
NEEDLE HYPO 22GX1.5 SAFETY (NEEDLE) ×2 IMPLANT
NS IRRIG 500ML POUR BTL (IV SOLUTION) ×2 IMPLANT
PACK ARTHROSCOPY SHOULDER (MISCELLANEOUS) ×2 IMPLANT
PAD WRAPON POLAR SHDR XLG (MISCELLANEOUS) ×1 IMPLANT
PASSER SUT CAPTURE FIRST (SUTURE) ×2 IMPLANT
SET TUBE SUCT SHAVER OUTFL 24K (TUBING) ×2 IMPLANT
SET TUBE TIP INTRA-ARTICULAR (MISCELLANEOUS) ×2 IMPLANT
STRAP SAFETY 5IN WIDE (MISCELLANEOUS) ×2 IMPLANT
STRIP CLOSURE SKIN 1/2X4 (GAUZE/BANDAGES/DRESSINGS) ×2 IMPLANT
SUT ETHILON 4-0 (SUTURE) ×2
SUT ETHILON 4-0 FS2 18XMFL BLK (SUTURE) ×2
SUT KNTLS 2.8 MAGNUM (Anchor) ×4 IMPLANT
SUT LASSO 90 DEG CVD (SUTURE) ×2 IMPLANT
SUT LASSO 90 DEG SD STR (SUTURE) ×2 IMPLANT
SUT MNCRL 4-0 (SUTURE)
SUT MNCRL 4-0 27XMFL (SUTURE)
SUT PDS AB 0 CT1 27 (SUTURE) IMPLANT
SUT PERFECTPASSER WHITE CART (SUTURE) ×2 IMPLANT
SUT VIC AB 0 CT1 36 (SUTURE) IMPLANT
SUT VIC AB 2-0 CT2 27 (SUTURE) IMPLANT
SUTURE ETHLN 4-0 FS2 18XMF BLK (SUTURE) ×2 IMPLANT
SUTURE MAGNUM WIRE 2X48 BLK (SUTURE) IMPLANT
SUTURE MNCRL 4-0 27XMF (SUTURE) IMPLANT
SYR 10ML 18GX1 1/2 (NEEDLE) ×2 IMPLANT
SYR 10ML LL (SYRINGE) ×2 IMPLANT
SYR 3ML 18GX1 1/2 (SYRINGE) ×2 IMPLANT
TAPE MICROFOAM 4IN (TAPE) ×2 IMPLANT
TUBING ARTHRO INFLOW-ONLY STRL (TUBING) ×2 IMPLANT
TUBING CONNECTING 10 (TUBING) ×2 IMPLANT
WAND HAND CNTRL MULTIVAC 90 (MISCELLANEOUS) ×2 IMPLANT
WRAPON POLAR PAD SHDR XLG (MISCELLANEOUS) ×2

## 2017-09-14 NOTE — Anesthesia Preprocedure Evaluation (Addendum)
Anesthesia Evaluation  Patient identified by MRN, date of birth, ID band Patient awake    Reviewed: Allergy & Precautions, NPO status , Patient's Chart, lab work & pertinent test results  History of Anesthesia Complications Negative for: history of anesthetic complications  Airway Mallampati: II       Dental   Pulmonary neg sleep apnea, neg COPD,           Cardiovascular (-) hypertension(-) Past MI and (-) CHF (-) dysrhythmias (-) Valvular Problems/Murmurs     Neuro/Psych neg Seizures    GI/Hepatic Neg liver ROS, neg GERD  ,  Endo/Other  neg diabetes  Renal/GU negative Renal ROS     Musculoskeletal   Abdominal   Peds  Hematology   Anesthesia Other Findings   Reproductive/Obstetrics                             Anesthesia Physical Anesthesia Plan  ASA: I  Anesthesia Plan: General   Post-op Pain Management: GA combined w/ Regional for post-op pain   Induction: Intravenous  PONV Risk Score and Plan: 2 and Dexamethasone and Ondansetron  Airway Management Planned: Oral ETT  Additional Equipment:   Intra-op Plan:   Post-operative Plan:   Informed Consent: I have reviewed the patients History and Physical, chart, labs and discussed the procedure including the risks, benefits and alternatives for the proposed anesthesia with the patient or authorized representative who has indicated his/her understanding and acceptance.     Plan Discussed with:   Anesthesia Plan Comments:         Anesthesia Quick Evaluation

## 2017-09-14 NOTE — H&P (Signed)
The patient has been re-examined, and the chart reviewed, and there have been no interval changes to the documented history and physical.    The risks, benefits, and alternatives have been discussed at length, and the patient is willing to proceed.   

## 2017-09-14 NOTE — Discharge Instructions (Signed)
AMBULATORY SURGERY  °DISCHARGE INSTRUCTIONS ° ° °1) The drugs that you were given will stay in your system until tomorrow so for the next 24 hours you should not: ° °A) Drive an automobile °B) Make any legal decisions °C) Drink any alcoholic beverage ° ° °2) You may resume regular meals tomorrow.  Today it is better to start with liquids and gradually work up to solid foods. ° °You may eat anything you prefer, but it is better to start with liquids, then soup and crackers, and gradually work up to solid foods. ° ° °3) Please notify your doctor immediately if you have any unusual bleeding, trouble breathing, redness and pain at the surgery site, drainage, fever, or pain not relieved by medication. °4)  ° °5) Your post-operative visit with Dr.                     °           °     is: Date:                        Time:   ° °Please call to schedule your post-operative visit. ° °6) Additional Instructions: ° ° ° °   Interscalene Nerve Block with Exparel ° °1.  For your surgery you have received an Interscalene Nerve Block with Exparel. °2. Nerve Blocks affect many types of nerves, including nerves that control movement, pain and normal sensation.  You may experience feelings such as numbness, tingling, heaviness, weakness or the inability to move your arm or the feeling or sensation that your arm has "fallen asleep". °3. A nerve block with Exparel can last up to 5 days.  Usually the weakness wears off first.  The tingling and heaviness usually wear off next.  Finally you may start to notice pain.  Keep in mind that this may occur in any order.  Once a nerve block starts to wear off it is usually completely gone within 60 minutes. °4. ISNB may cause mild shortness of breath, a hoarse voice, blurry vision, unequal pupils, or drooping of the face on the same side as the nerve block.  These symptoms will usually resolve with the numbness.  Very rarely the procedure itself can cause mild seizures. °5. If needed, your surgeon  will give you a prescription for pain medication.  It will take about 60 minutes for the oral pain medication to become fully effective.  So, it is recommended that you start taking this medication before the nerve block first begins to wear off, or when you first begin to feel discomfort. °6. Take your pain medication only as prescribed.  Pain medication can cause sedation and decrease your breathing if you take more than you need for the level of pain that you have. °7. Nausea is a common side effect of many pain medications.  You may want to eat something before taking your pain medicine to prevent nausea. °8. After an Interscalene nerve block, you cannot feel pain, pressure or extremes in temperature in the effected arm.  Because your arm is numb it is at an increased risk for injury.  To decrease the possibility of injury, please practice the following: ° °a. While you are awake change the position of your arm frequently to prevent too much pressure on any one area for prolonged periods of time. °b.  If you have a cast or tight dressing, check the color or your fingers every   couple of hours.  Call your surgeon with the appearance of any discoloration (white or blue). °c. If you are given a sling to wear before you go home, please wear it  at all times until the block has completely worn off.  Do not get up at night without your sling. °d. Please contact [facility] or your surgeon if you do not begin to regain sensation after 7 days from the surgery.  Anesthesia may be contacted by calling the Same Day Surgery Department, Mon. through Fri., 6 am to 4 pm at [phone].   °e. If you experience any other problems or concerns, please contact your surgeon's office. °f. If you experience severe or prolonged shortness of breath go to the nearest emergency department. °

## 2017-09-14 NOTE — Anesthesia Procedure Notes (Signed)
Anesthesia Regional Block: Interscalene brachial plexus block   Pre-Anesthetic Checklist: ,, timeout performed, Correct Patient, Correct Site, Correct Laterality, Correct Procedure, Correct Position, site marked, Risks and benefits discussed,  Surgical consent,  Pre-op evaluation,  At surgeon's request and post-op pain management  Laterality: Left  Prep: chloraprep       Needles:  Injection technique: Single-shot  Needle Type: Echogenic Stimulator Needle     Needle Length: 9cm  Needle Gauge: 21     Additional Needles:   Procedures:, nerve stimulator,,, ultrasound used (permanent image in chart),,,,   Nerve Stimulator or Paresthesia:  Response: biceps flexion,   Additional Responses:   Narrative:  Start time: 09/14/2017 7:35 AM End time: 09/14/2017 7:42 AM Injection made incrementally with aspirations every 5 mL.  Performed by: Personally   Additional Notes: Functioning IV was confirmed and monitors were applied.  A 31mm 22ga Stimuplex needle was used. Sterile prep and drape,hand hygiene and sterile gloves were used.  Negative aspiration and negative test dose prior to incremental administration of local anesthetic. The patient tolerated the procedure well.

## 2017-09-14 NOTE — Anesthesia Procedure Notes (Signed)
Procedure Name: Intubation Date/Time: 09/14/2017 7:56 AM Performed by: Bernardo Heater, CRNA Pre-anesthesia Checklist: Patient identified, Emergency Drugs available, Suction available and Patient being monitored Patient Re-evaluated:Patient Re-evaluated prior to induction Oxygen Delivery Method: Circle system utilized Preoxygenation: Pre-oxygenation with 100% oxygen Induction Type: IV induction Laryngoscope Size: Mac and 3 Grade View: Grade I Tube size: 7.0 mm Number of attempts: 1 Placement Confirmation: ETT inserted through vocal cords under direct vision,  positive ETCO2 and breath sounds checked- equal and bilateral Secured at: 23 cm Tube secured with: Tape

## 2017-09-14 NOTE — Op Note (Signed)
09/14/2017  11:57 AM  PATIENT:  Elijah Walker  67 y.o. male  PRE-OPERATIVE DIAGNOSIS:  Left shoulder SLAP tear, subacromial impingement, acromioclavicular arthrosis and possible partial versus full-thickness rotator cuff tear  POST-OPERATIVE DIAGNOSIS:  Same   PROCEDURE:  Procedure(s): LEFT SHOULDER ARTHROSCOPY WITH MINI OPEN ROTATOR CUFF REPAIR, SUBACROMINAL DECOMPRESSION, SUPERIOR LABRAL (SLAP) REPAIR, BICEPS TENOTOMY, DISTAL CLAVICLE EXCISION  (Left)  SURGEON:  Surgeon(s) and Role:    Thornton Park, MD - Primary  ANESTHESIA:   local, general and paracervical block   PREOPERATIVE INDICATIONS:  Horice Carrero is a  67 y.o. male with a diagnosis of S43.432A Superior glenoid labrum lesion of left shoulder with possible high grade partial vs. Full thickness rotator cuff tear who failed conservative measures and elected for surgical management.    The risks benefits and alternatives were discussed with the patient preoperatively including but not limited to the risks of infection, bleeding, nerve injury, persistent pain or weakness, failure of the hardware, re-tear of the rotator cuff and the need for further surgery. Medical risks include DVT and pulmonary embolism, myocardial infarction, stroke, pneumonia, respiratory failure and death. Patient understood these risks and wished to proceed.  OPERATIVE IMPLANTS: ArthroCare Magnum 2 anchors x 2 & Smith and Nephew Q Fix anchors x 2 for rotator cuff repair and Arthrex biosuturetak anchors x 2 for superior labral repair  OPERATIVE PROCEDURE: The patient was met in the preoperative area. The right shoulder was signed with the word yes and my initials according the hospital's correct site of surgery protocol. The patient underwent a left interscalene block with Exparel by the anesthesia service in the preoperative area. The patient was then brought to the OR and underwent  general endotracheal intubation by the anesthesia service.  The patient  was placed in a beachchair position. A spider arm positioner was used for this case. Examination under anesthesia revealed full passive motion of the left shoulder without gross instability.  The patient was prepped and draped in a sterile fashion. A timeout was performed to verify the patient's name, date of birth, medical record number, correct site of surgery and correct procedure to be performed there was also used to verify the patient received antibiotics that all appropriate instruments, implants and radiographs studies were available in the room. Once all in attendance were in agreement case began.  Bony landmarks were drawn out with a surgical marker along with proposed arthroscopy incisions. These were pre-injected with 1% lidocaine plain. An 11 blade was used to establish a posterior portal through which the arthroscope was placed in the glenohumeral joint. A full diagnostic examination of the shoulder was performed. The anterior portal was established under direct visualization with an 18-gauge spinal needle.  A 5.75 mm arthroscopic cannula was placed through the anterior portal.   The intra-articular portion of the biceps tendon was found to have a partial tear involving greater than 50% of the diameter. Therefore the decision was made to perform a tenotomy. An arthroscopic scissor was used to release the biceps tendon off the superior labrum. The arthroscopic shaver was then used to debride the frayed edges of the labrum. There was a large SLAP tear seen which allowed the superior labrum to displace into the glenohumeral joint.  A 4.0 shaver blade was use to bur the superior aspect of the glenoid until punctate bleeding was identified. This was in preparation for the superior labral repair.  A second anterolateral portal was placed under direct visualization using an 18-gauge spinal needle. A  7.0 mm arthroscopic cannula was placed through this portal.  The Arthrex percutaneous kit was used to  drill 2 anchor holes of the superior glenoid. The sutures were brought out through the anterior portal. A 90 Arthrex suture lasso was then used to shuttle one limb of each anchor under the superior labrum arthroscopic knots were then tied to repair the superior labrum to the bony glenoid.  Final arthroscopic images of the labral tear were taken.  The attention was turned back to the rotator cuff.  The subscapularis tendon had fraying at the superior margin, but was intact. There was no evidence for a full-thickness subscapularis tear.    Patient had a small full-thickness tear involving the supraspinatus as well as a large high-grade partial-thickness tear involving the posterior aspect of the supraspinatus extending into the infraspinatus . There were no loose bodies within the inferior recess and no evidence of HAGL lesion.    The 4.0 resector shaver blade was then used to debride the rotator cuff tear from the lateral portal. All degenerative fibers of the rotator cuff were debrided until healthy margin was achieved. The greater tuberosity was burred with the 4.0 resector shaver blade to punctate bleeding was identified. This was in preparation for patient's rotator cuff repair.  The arthroscope was then placed in the subacromial space.  Extensive bursitis was encountered and debrided using a 4-0 resector shaver blade and a 90 ArthroCare wand from the lateral portal. A subacromial decompression was also performed using a 5.5 mm resector shaver blade from the lateral portal. The 5.5 mm resector shaver blade was then placed through the anterior portal and distal clavicle excision was performed. Two ArthroCare Perfect Pass sutures were placed in the lateral border of the rotator cuff tear. All arthroscopic instruments were then removed and the mini-open portion of the procedure began.   A saber-type incision was made along the lateral border of the acromion. The deltoid muscle was identified and split  in line with its fibers which allowed visualization of the rotator cuff. The Perfect Pass sutures previously placed in the lateral border of the rotator cuff werealso brought out through the deltoid split. Two Q-Fix anchors were then placed at the articular margin of the humeral head and greater tuberosity. The four suture limbs of each Q Fix anchor was passed medially through the rotator cuff using a first pass suture passer. The Perfect Pass sutures from the lateral border of the rotator cuff were then anchored to thegreater tuberosity of the humeral head using two Magnum 2 anchors. These anchors were tensioned to allow for anatomic reduction of the rotator cuff to the greater tuberosity footprint. The medial row repair was then completed using an arthroscopic knot tying technique with the Q fix anchor sutures. Once all sutures were tied down, arthroscopic images of the double row repair were taken with the arthroscope both externally and arthroscopically fromthe glenohumeral joint  All incisions were copiously irrigated. The deltoid fascia was repaired using a 0 Vicryl suturean interrupted fashion. The subcutaneous tissue of all incisions were closed with a 2-0 Vicryl. Skin closure for the arthroscopic incisions was performed with 4-0 nylon. The skin edges of the saber incision were approximated with a running 4-0 undyed Monocryl. 0.25%  Marcaine plain was injected into the subacromial space and at the injection sites.  A dry sterile dressing including Steri-Strips was applied . The patient was placed in an abduction sling, with a Polar Care sleeve.  All sharp and instrument counts were correct  at the conclusion of the case. I was scrubbed and present for the entire case. I spoke with the patient's wife in the post-op consultation room and informed her that the case had been performed without complication and the patient was stable in recovery room.     Timoteo Gaul, MD

## 2017-09-14 NOTE — Transfer of Care (Signed)
Immediate Anesthesia Transfer of Care Note  Patient: [name]  Procedure(s) Performed: SHOULDER ARTHROSCOPY WITH MINI OPEN ROTATOR CUFF REPAIR, SUBACROMINAL DECOMPRESSION, LABRAL REPAIR,BICEP TENOTOMY, DISTAL CLAVICLE EXCISION  (Left Shoulder)  Patient Location: PACU  Anesthesia Type:General  Level of Consciousness: awake, alert , oriented and patient cooperative  Airway & Oxygen Therapy: Patient Spontanous Breathing and Patient connected to nasal cannula oxygen  Post-op Assessment: Report given to RN and Post -op Vital signs reviewed and stable  Post vital signs: Reviewed and stable  Last Vitals:  Vitals:   09/14/17 0744 09/14/17 1135  BP: 119/70 (!) 141/80  Pulse: 63 64  Resp: 12 10  Temp:  (!) 36.2 C  SpO2: 97% 99%    Last Pain:  Vitals:   09/14/17 0739  TempSrc:   PainSc: Asleep         Complications: No apparent anesthesia complications

## 2017-09-14 NOTE — Anesthesia Postprocedure Evaluation (Signed)
Anesthesia Post Note  Patient: Niranjan Rufener  Procedure(s) Performed: SHOULDER ARTHROSCOPY WITH MINI OPEN ROTATOR CUFF REPAIR, SUBACROMINAL DECOMPRESSION, LABRAL REPAIR,BICEP TENOTOMY, DISTAL CLAVICLE EXCISION  (Left Shoulder)  Patient location during evaluation: PACU Anesthesia Type: General Level of consciousness: awake and alert Pain management: pain level controlled Vital Signs Assessment: post-procedure vital signs reviewed and stable Respiratory status: spontaneous breathing and respiratory function stable Cardiovascular status: stable Anesthetic complications: no     Last Vitals:  Vitals:   09/14/17 1220 09/14/17 1231  BP: 128/75 (!) 144/74  Pulse: 65 67  Resp: 10 16  Temp: (!) 36.2 C (!) 36.1 C  SpO2: 96% 98%    Last Pain:  Vitals:   09/14/17 1231  TempSrc: Tympanic  PainSc: 0-No pain                 Adaria Hole K

## 2017-09-14 NOTE — Anesthesia Post-op Follow-up Note (Signed)
Anesthesia QCDR form completed.        

## 2017-09-24 DIAGNOSIS — M25612 Stiffness of left shoulder, not elsewhere classified: Secondary | ICD-10-CM | POA: Diagnosis not present

## 2017-09-24 DIAGNOSIS — M25512 Pain in left shoulder: Secondary | ICD-10-CM | POA: Diagnosis not present

## 2017-10-01 DIAGNOSIS — M25612 Stiffness of left shoulder, not elsewhere classified: Secondary | ICD-10-CM | POA: Diagnosis not present

## 2017-10-01 DIAGNOSIS — M25512 Pain in left shoulder: Secondary | ICD-10-CM | POA: Diagnosis not present

## 2017-10-06 DIAGNOSIS — M25512 Pain in left shoulder: Secondary | ICD-10-CM | POA: Diagnosis not present

## 2017-10-06 DIAGNOSIS — M25612 Stiffness of left shoulder, not elsewhere classified: Secondary | ICD-10-CM | POA: Diagnosis not present

## 2017-10-08 DIAGNOSIS — M25512 Pain in left shoulder: Secondary | ICD-10-CM | POA: Diagnosis not present

## 2017-10-08 DIAGNOSIS — M25612 Stiffness of left shoulder, not elsewhere classified: Secondary | ICD-10-CM | POA: Diagnosis not present

## 2017-10-12 DIAGNOSIS — M25512 Pain in left shoulder: Secondary | ICD-10-CM | POA: Diagnosis not present

## 2017-10-12 DIAGNOSIS — M25612 Stiffness of left shoulder, not elsewhere classified: Secondary | ICD-10-CM | POA: Diagnosis not present

## 2017-10-14 DIAGNOSIS — M25612 Stiffness of left shoulder, not elsewhere classified: Secondary | ICD-10-CM | POA: Diagnosis not present

## 2017-10-14 DIAGNOSIS — M25512 Pain in left shoulder: Secondary | ICD-10-CM | POA: Diagnosis not present

## 2017-10-18 DIAGNOSIS — M25612 Stiffness of left shoulder, not elsewhere classified: Secondary | ICD-10-CM | POA: Diagnosis not present

## 2017-10-18 DIAGNOSIS — M25512 Pain in left shoulder: Secondary | ICD-10-CM | POA: Diagnosis not present

## 2017-10-22 DIAGNOSIS — M25512 Pain in left shoulder: Secondary | ICD-10-CM | POA: Diagnosis not present

## 2017-10-22 DIAGNOSIS — M25612 Stiffness of left shoulder, not elsewhere classified: Secondary | ICD-10-CM | POA: Diagnosis not present

## 2017-10-26 DIAGNOSIS — M25612 Stiffness of left shoulder, not elsewhere classified: Secondary | ICD-10-CM | POA: Diagnosis not present

## 2017-10-26 DIAGNOSIS — M25512 Pain in left shoulder: Secondary | ICD-10-CM | POA: Diagnosis not present

## 2017-10-28 DIAGNOSIS — M25612 Stiffness of left shoulder, not elsewhere classified: Secondary | ICD-10-CM | POA: Diagnosis not present

## 2017-10-28 DIAGNOSIS — M25512 Pain in left shoulder: Secondary | ICD-10-CM | POA: Diagnosis not present

## 2017-11-01 DIAGNOSIS — M25612 Stiffness of left shoulder, not elsewhere classified: Secondary | ICD-10-CM | POA: Diagnosis not present

## 2017-11-01 DIAGNOSIS — R609 Edema, unspecified: Secondary | ICD-10-CM | POA: Diagnosis not present

## 2017-11-01 DIAGNOSIS — M25512 Pain in left shoulder: Secondary | ICD-10-CM | POA: Diagnosis not present

## 2017-11-03 DIAGNOSIS — M25512 Pain in left shoulder: Secondary | ICD-10-CM | POA: Diagnosis not present

## 2017-11-03 DIAGNOSIS — M25612 Stiffness of left shoulder, not elsewhere classified: Secondary | ICD-10-CM | POA: Diagnosis not present

## 2017-11-05 DIAGNOSIS — M25612 Stiffness of left shoulder, not elsewhere classified: Secondary | ICD-10-CM | POA: Diagnosis not present

## 2017-11-05 DIAGNOSIS — R609 Edema, unspecified: Secondary | ICD-10-CM | POA: Diagnosis not present

## 2017-11-05 DIAGNOSIS — M25512 Pain in left shoulder: Secondary | ICD-10-CM | POA: Diagnosis not present

## 2017-11-10 DIAGNOSIS — R600 Localized edema: Secondary | ICD-10-CM | POA: Diagnosis not present

## 2017-11-10 DIAGNOSIS — M25612 Stiffness of left shoulder, not elsewhere classified: Secondary | ICD-10-CM | POA: Diagnosis not present

## 2017-11-10 DIAGNOSIS — M25512 Pain in left shoulder: Secondary | ICD-10-CM | POA: Diagnosis not present

## 2017-11-16 DIAGNOSIS — M25612 Stiffness of left shoulder, not elsewhere classified: Secondary | ICD-10-CM | POA: Diagnosis not present

## 2017-11-16 DIAGNOSIS — M25512 Pain in left shoulder: Secondary | ICD-10-CM | POA: Diagnosis not present

## 2017-11-18 DIAGNOSIS — M25512 Pain in left shoulder: Secondary | ICD-10-CM | POA: Diagnosis not present

## 2017-11-18 DIAGNOSIS — M25612 Stiffness of left shoulder, not elsewhere classified: Secondary | ICD-10-CM | POA: Diagnosis not present

## 2017-11-18 DIAGNOSIS — R609 Edema, unspecified: Secondary | ICD-10-CM | POA: Diagnosis not present

## 2017-11-22 DIAGNOSIS — M25512 Pain in left shoulder: Secondary | ICD-10-CM | POA: Diagnosis not present

## 2017-11-22 DIAGNOSIS — M25612 Stiffness of left shoulder, not elsewhere classified: Secondary | ICD-10-CM | POA: Diagnosis not present

## 2017-11-24 DIAGNOSIS — M25612 Stiffness of left shoulder, not elsewhere classified: Secondary | ICD-10-CM | POA: Diagnosis not present

## 2017-11-24 DIAGNOSIS — M25512 Pain in left shoulder: Secondary | ICD-10-CM | POA: Diagnosis not present

## 2017-11-26 ENCOUNTER — Encounter: Payer: Self-pay | Admitting: Internal Medicine

## 2017-11-26 DIAGNOSIS — E7800 Pure hypercholesterolemia, unspecified: Secondary | ICD-10-CM

## 2017-11-26 DIAGNOSIS — R7989 Other specified abnormal findings of blood chemistry: Secondary | ICD-10-CM

## 2017-11-29 DIAGNOSIS — M25612 Stiffness of left shoulder, not elsewhere classified: Secondary | ICD-10-CM | POA: Diagnosis not present

## 2017-11-29 DIAGNOSIS — M25512 Pain in left shoulder: Secondary | ICD-10-CM | POA: Diagnosis not present

## 2017-11-29 NOTE — Telephone Encounter (Signed)
See his my chart message.  Please call and schedule pt for fasting lab appt within the next 1-2 weeks.  I have placed the order for the lab.

## 2017-11-30 ENCOUNTER — Ambulatory Visit (INDEPENDENT_AMBULATORY_CARE_PROVIDER_SITE_OTHER): Payer: PPO | Admitting: Internal Medicine

## 2017-11-30 ENCOUNTER — Encounter: Payer: Self-pay | Admitting: Internal Medicine

## 2017-11-30 DIAGNOSIS — D649 Anemia, unspecified: Secondary | ICD-10-CM | POA: Diagnosis not present

## 2017-11-30 DIAGNOSIS — R739 Hyperglycemia, unspecified: Secondary | ICD-10-CM

## 2017-11-30 DIAGNOSIS — R7989 Other specified abnormal findings of blood chemistry: Secondary | ICD-10-CM | POA: Diagnosis not present

## 2017-11-30 DIAGNOSIS — E7800 Pure hypercholesterolemia, unspecified: Secondary | ICD-10-CM

## 2017-11-30 DIAGNOSIS — M25512 Pain in left shoulder: Secondary | ICD-10-CM

## 2017-11-30 LAB — LIPID PANEL
Cholesterol: 244 mg/dL — ABNORMAL HIGH (ref 0–200)
HDL: 43.2 mg/dL (ref >39.00)
LDL Cholesterol: 171 mg/dL — ABNORMAL HIGH (ref 0–99)
NonHDL: 200.43
TRIGLYCERIDES: 147 mg/dL (ref 0.0–149.0)
Total CHOL/HDL Ratio: 6
VLDL: 29.4 mg/dL (ref 0.0–40.0)

## 2017-11-30 LAB — BASIC METABOLIC PANEL
BUN: 17 mg/dL (ref 6–23)
CALCIUM: 9.5 mg/dL (ref 8.4–10.5)
CO2: 28 mEq/L (ref 19–32)
Chloride: 104 mEq/L (ref 96–112)
Creatinine, Ser: 1.17 mg/dL (ref 0.40–1.50)
GFR: 66.21 mL/min (ref >60.00)
Glucose, Bld: 83 mg/dL (ref 70–99)
Potassium: 4.3 mEq/L (ref 3.5–5.1)
SODIUM: 141 meq/L (ref 135–145)

## 2017-11-30 NOTE — Progress Notes (Signed)
Patient ID: Elijah Walker, male   DOB: 07-Jan-1951, 67 y.o.   MRN: [id number]   Subjective:    Patient ID: Elijah Walker, male    DOB: 28-Nov-1950, 67 y.o.   MRN: [id number]  HPI  Patient here for a scheduled follow up.  Is s/p left shoulder surgery 09/14/17.  Had problems with infection after surgery.  Took two rounds of antibiotics.  Still some residual "background" discomfort, but is getting better.  Doing physical therapy.  Gradually feeling better.  No chest pain.  Breathing stable.  Has not been watching his diet as well.  Some diarrhea previously with abx, but states bowels are pretty much back to normal now.    Past Medical History:  Diagnosis Date  . Blood in stool    H/O  . History of chicken pox   . Hyperlipidemia    Past Surgical History:  Procedure Laterality Date  . CHOLECYSTECTOMY  1998  . COLONOSCOPY    . SHOULDER ARTHROSCOPY WITH B[place]ART REPAIR Left 09/14/2017   Procedure: SHOULDER ARTHROSCOPY WITH MINI OPEN ROTATOR CUFF REPAIR, SUBACROMINAL DECOMPRESSION, LABRAL REPAIR,BICEP TENOTOMY, DISTAL CLAVICLE EXCISION ;  Surgeon: Thornton Park, MD;  Location: [facility] ORS;  Service: Orthopedics;  Laterality: Left;   Family History  Problem Relation Age of Onset  . Diabetes Father    Social History   Socioeconomic History  . Marital status: Married    Spouse name: Not on file  . Number of children: Not on file  . Years of education: Not on file  . Highest education level: Not on file  Occupational History  . Not on file  Social Needs  . Financial resource strain: Not on file  . Food insecurity:    Worry: Not on file    Inability: Not on file  . Transportation needs:    Medical: Not on file    Non-medical: Not on file  Tobacco Use  . Smoking status: Never Smoker  . Smokeless tobacco: Never Used  Substance and Sexual Activity  . Alcohol use: No    Alcohol/week: 0.0 oz    Comment: occ  . Drug use: No  . Sexual activity: Not on file  Lifestyle  . Physical  activity:    Days per week: Not on file    Minutes per session: Not on file  . Stress: Not on file  Relationships  . Social connections:    Talks on phone: Not on file    Gets together: Not on file    Attends religious service: Not on file    Active member of club or organization: Not on file    Attends meetings of clubs or organizations: Not on file    Relationship status: Not on file  Other Topics Concern  . Not on file  Social History Narrative  . Not on file    Outpatient Encounter Medications as of 11/30/2017  Medication Sig  . Cholecalciferol (VITAMIN D) 2000 units CAPS Take 2,000 Units by mouth 2 (two) times daily.  . Omega-3 Fatty Acids (FISH OIL PO) Take 4,400 mg by mouth daily.  . [DISCONTINUED] ondansetron (ZOFRAN) 4 MG tablet Take 1 tablet (4 mg total) by mouth every 8 (eight) hours as needed for nausea or vomiting. (Patient not taking: Reported on 11/30/2017)  . [DISCONTINUED] oxyCODONE (OXY IR/ROXICODONE) 5 MG immediate release tablet Take 1 tablet (5 mg total) by mouth every 4 (four) hours as needed. (Patient not taking: Reported on 11/30/2017)   No facility-administered encounter medications on file as  of 11/30/2017.     Review of Systems  Constitutional: Negative for appetite change and unexpected weight change.  HENT: Negative for congestion and sinus pressure.   Respiratory: Negative for cough, chest tightness and shortness of breath.   Cardiovascular: Negative for chest pain, palpitations and leg swelling.  Gastrointestinal: Negative for abdominal pain, diarrhea, nausea and vomiting.  Genitourinary: Negative for difficulty urinating and dysuria.  Musculoskeletal: Negative for joint swelling and myalgias.       Shoulder pain improved.    Skin: Negative for color change and rash.  Neurological: Negative for dizziness, light-headedness and headaches.  Psychiatric/Behavioral: Negative for agitation and dysphoric mood.       Objective:     Blood pressure  rechecked by me:  122/78  Physical Exam  Constitutional: He appears well-developed and well-nourished. No distress.  HENT:  Nose: Nose normal.  Mouth/Throat: Oropharynx is clear and moist.  Neck: Neck supple. No thyromegaly present.  Cardiovascular: Normal rate and regular rhythm.  Pulmonary/Chest: Effort normal and breath sounds normal. No respiratory distress.  Abdominal: Soft. Bowel sounds are normal. There is no tenderness.  Musculoskeletal: He exhibits no edema or tenderness.  Lymphadenopathy:    He has no cervical adenopathy.  Skin: No rash noted. No erythema.  Psychiatric: He has a normal mood and affect. His behavior is normal.    BP 108/74 (BP Location: Right Arm, Patient Position: Sitting, Cuff Size: Normal)   Pulse 71   Temp 98.4 F (36.9 C) (Oral)   Resp 16   Wt 158 lb 4 oz (71.8 kg)   SpO2 97%   BMI 24.79 kg/m  Wt Readings from Last 3 Encounters:  11/30/17 158 lb 4 oz (71.8 kg)  09/14/17 155 lb (70.3 kg)  09/07/17 159 lb (72.1 kg)     Lab Results  Component Value Date   WBC 4.3 08/30/2017   HGB 15.5 08/30/2017   HCT 46.0 08/30/2017   PLT 215.0 08/30/2017   GLUCOSE 83 11/30/2017   CHOL 244 (H) 11/30/2017   TRIG 147.0 11/30/2017   HDL 43.20 11/30/2017   LDLCALC 171 (H) 11/30/2017   ALT 23 04/30/2017   AST 20 04/30/2017   NA 141 11/30/2017   K 4.3 11/30/2017   CL 104 11/30/2017   CREATININE 1.17 11/30/2017   BUN 17 11/30/2017   CO2 28 11/30/2017   TSH 1.78 12/23/2016   PSA 1.61 07/21/2016   INR 1.0 08/30/2017   HGBA1C 5.8 09/09/2017   MICROALBUR <0.7 07/20/2016       Assessment & Plan:   Problem List Items Addressed This Visit    Anemia    Follow cbc.        Hypercholesterolemia    Off pravastatin.  Has not been watching his diet.  Low cholesterol diet and exercise.  Follow lipid panel.  Recheck today.        Hyperglycemia    Low carb diet and exercise.  Follow met b and a1c.        Left shoulder pain    S/p surgery.  Pain is  better.  Continue exercise.  Follow.         Other Visit Diagnoses    Elevated serum creatinine       previous elevation.  stay hydrated.  recheck met b today.         [name], MD

## 2017-11-30 NOTE — Progress Notes (Signed)
Pre-visit discussion using our clinic review tool. No additional management support is needed unless otherwise documented below in the visit note.  

## 2017-12-01 ENCOUNTER — Encounter: Payer: Self-pay | Admitting: Internal Medicine

## 2017-12-01 DIAGNOSIS — M25612 Stiffness of left shoulder, not elsewhere classified: Secondary | ICD-10-CM | POA: Diagnosis not present

## 2017-12-01 DIAGNOSIS — M25512 Pain in left shoulder: Secondary | ICD-10-CM | POA: Diagnosis not present

## 2017-12-02 NOTE — Telephone Encounter (Signed)
Pt had labs done on 5/21 at his appointment,

## 2017-12-06 ENCOUNTER — Encounter: Payer: Self-pay | Admitting: Internal Medicine

## 2017-12-06 NOTE — Assessment & Plan Note (Signed)
S/p surgery.  Pain is better.  Continue exercise.  Follow.

## 2017-12-06 NOTE — Assessment & Plan Note (Signed)
Follow cbc.  

## 2017-12-06 NOTE — Assessment & Plan Note (Signed)
Low carb diet and exercise.  Follow met b and a1c.   

## 2017-12-06 NOTE — Assessment & Plan Note (Signed)
Off pravastatin.  Has not been watching his diet.  Low cholesterol diet and exercise.  Follow lipid panel.  Recheck today.

## 2017-12-07 DIAGNOSIS — M25512 Pain in left shoulder: Secondary | ICD-10-CM | POA: Diagnosis not present

## 2017-12-07 DIAGNOSIS — M25612 Stiffness of left shoulder, not elsewhere classified: Secondary | ICD-10-CM | POA: Diagnosis not present

## 2017-12-09 DIAGNOSIS — M25512 Pain in left shoulder: Secondary | ICD-10-CM | POA: Diagnosis not present

## 2017-12-09 DIAGNOSIS — M25612 Stiffness of left shoulder, not elsewhere classified: Secondary | ICD-10-CM | POA: Diagnosis not present

## 2017-12-13 DIAGNOSIS — M25512 Pain in left shoulder: Secondary | ICD-10-CM | POA: Diagnosis not present

## 2017-12-13 DIAGNOSIS — M25612 Stiffness of left shoulder, not elsewhere classified: Secondary | ICD-10-CM | POA: Diagnosis not present

## 2017-12-16 DIAGNOSIS — M25612 Stiffness of left shoulder, not elsewhere classified: Secondary | ICD-10-CM | POA: Diagnosis not present

## 2017-12-16 DIAGNOSIS — M25512 Pain in left shoulder: Secondary | ICD-10-CM | POA: Diagnosis not present

## 2017-12-20 DIAGNOSIS — M25612 Stiffness of left shoulder, not elsewhere classified: Secondary | ICD-10-CM | POA: Diagnosis not present

## 2017-12-20 DIAGNOSIS — M25512 Pain in left shoulder: Secondary | ICD-10-CM | POA: Diagnosis not present

## 2017-12-23 DIAGNOSIS — M25512 Pain in left shoulder: Secondary | ICD-10-CM | POA: Diagnosis not present

## 2017-12-23 DIAGNOSIS — M25612 Stiffness of left shoulder, not elsewhere classified: Secondary | ICD-10-CM | POA: Diagnosis not present

## 2017-12-27 DIAGNOSIS — M25612 Stiffness of left shoulder, not elsewhere classified: Secondary | ICD-10-CM | POA: Diagnosis not present

## 2017-12-27 DIAGNOSIS — M25512 Pain in left shoulder: Secondary | ICD-10-CM | POA: Diagnosis not present

## 2017-12-29 DIAGNOSIS — M25612 Stiffness of left shoulder, not elsewhere classified: Secondary | ICD-10-CM | POA: Diagnosis not present

## 2017-12-29 DIAGNOSIS — M25512 Pain in left shoulder: Secondary | ICD-10-CM | POA: Diagnosis not present

## 2018-01-14 DIAGNOSIS — D1801 Hemangioma of skin and subcutaneous tissue: Secondary | ICD-10-CM | POA: Diagnosis not present

## 2018-01-14 DIAGNOSIS — B070 Plantar wart: Secondary | ICD-10-CM | POA: Diagnosis not present

## 2018-01-14 DIAGNOSIS — L821 Other seborrheic keratosis: Secondary | ICD-10-CM | POA: Diagnosis not present

## 2018-01-14 DIAGNOSIS — L814 Other melanin hyperpigmentation: Secondary | ICD-10-CM | POA: Diagnosis not present

## 2018-01-17 DIAGNOSIS — M25612 Stiffness of left shoulder, not elsewhere classified: Secondary | ICD-10-CM | POA: Diagnosis not present

## 2018-01-17 DIAGNOSIS — M25512 Pain in left shoulder: Secondary | ICD-10-CM | POA: Diagnosis not present

## 2018-01-20 DIAGNOSIS — M25612 Stiffness of left shoulder, not elsewhere classified: Secondary | ICD-10-CM | POA: Diagnosis not present

## 2018-01-20 DIAGNOSIS — M25512 Pain in left shoulder: Secondary | ICD-10-CM | POA: Diagnosis not present

## 2018-01-24 DIAGNOSIS — M25612 Stiffness of left shoulder, not elsewhere classified: Secondary | ICD-10-CM | POA: Diagnosis not present

## 2018-01-24 DIAGNOSIS — M25512 Pain in left shoulder: Secondary | ICD-10-CM | POA: Diagnosis not present

## 2018-02-03 DIAGNOSIS — M25512 Pain in left shoulder: Secondary | ICD-10-CM | POA: Diagnosis not present

## 2018-02-03 DIAGNOSIS — M25612 Stiffness of left shoulder, not elsewhere classified: Secondary | ICD-10-CM | POA: Diagnosis not present

## 2018-02-07 DIAGNOSIS — M25512 Pain in left shoulder: Secondary | ICD-10-CM | POA: Diagnosis not present

## 2018-02-14 ENCOUNTER — Other Ambulatory Visit: Payer: Self-pay | Admitting: Chiropractic Medicine

## 2018-02-14 ENCOUNTER — Ambulatory Visit
Admission: RE | Admit: 2018-02-14 | Discharge: 2018-02-14 | Disposition: A | Discharge status: Home / Self Care | Payer: PPO | Source: Ambulatory Visit | Attending: Chiropractic Medicine | Admitting: Chiropractic Medicine

## 2018-02-14 DIAGNOSIS — M2170 Unequal limb length (acquired), unspecified site: Secondary | ICD-10-CM

## 2018-02-16 DIAGNOSIS — M25512 Pain in left shoulder: Secondary | ICD-10-CM | POA: Diagnosis not present

## 2018-02-16 DIAGNOSIS — M25612 Stiffness of left shoulder, not elsewhere classified: Secondary | ICD-10-CM | POA: Diagnosis not present

## 2018-02-24 ENCOUNTER — Encounter: Payer: Self-pay | Admitting: Internal Medicine

## 2018-02-25 NOTE — Telephone Encounter (Signed)
Do you mind calling pt and letting him know that I am not exactly sure what they are going to do - to be able to give an ok.  I have never been asked before to give ok for acupuncture - so would need to know more specifics.   If having persistent pain in right hip and right knee, my preference would be an evaluation.  I would like to refer him to Dr Hulan Saas (in [place]).  He is a sports medicine physician and uses a more holistic approach in treating pts.

## 2018-02-28 NOTE — Telephone Encounter (Signed)
Patient is will take referral to Dr. Tamala Julian he would like to be contacted through [facility] chart with appointment .

## 2018-03-07 DIAGNOSIS — M25612 Stiffness of left shoulder, not elsewhere classified: Secondary | ICD-10-CM | POA: Diagnosis not present

## 2018-03-07 DIAGNOSIS — M25512 Pain in left shoulder: Secondary | ICD-10-CM | POA: Diagnosis not present

## 2018-03-08 ENCOUNTER — Ambulatory Visit (INDEPENDENT_AMBULATORY_CARE_PROVIDER_SITE_OTHER): Payer: PPO | Admitting: Internal Medicine

## 2018-03-08 ENCOUNTER — Encounter: Payer: Self-pay | Admitting: Internal Medicine

## 2018-03-08 VITALS — BP 126/70 | HR 74 | Temp 98.3°F | Resp 18 | Ht 67.5 in | Wt 166.6 lb

## 2018-03-08 DIAGNOSIS — E7800 Pure hypercholesterolemia, unspecified: Secondary | ICD-10-CM | POA: Diagnosis not present

## 2018-03-08 DIAGNOSIS — Z0000 Encounter for general adult medical examination without abnormal findings: Secondary | ICD-10-CM

## 2018-03-08 DIAGNOSIS — Z860100 Personal history of colon polyps, unspecified: Secondary | ICD-10-CM

## 2018-03-08 DIAGNOSIS — D649 Anemia, unspecified: Secondary | ICD-10-CM | POA: Diagnosis not present

## 2018-03-08 DIAGNOSIS — R739 Hyperglycemia, unspecified: Secondary | ICD-10-CM

## 2018-03-08 NOTE — Progress Notes (Signed)
Patient ID: Elijah Walker, male   DOB: Jul 20, 1950, 67 y.o.   MRN: [id number]   Subjective:    Patient ID: Elijah Walker, male    DOB: [birth year], 67 y.o.   MRN: [id number]  HPI  Patient here for his physical exam.  Is s/p left shoulder surgery 09/14/17.  No pain in shoulder now.  Good rom.  Stays active.  No chest pain.  No sob.  No acid reflux.  No abdominal pain.  Bowels moving.  No urine change.  Blood pressures averaging 120/80.  Going to chiropractor for right hip.  Desires no further intervention.     Past Medical History:  Diagnosis Date  . Blood in stool    H/O  . History of chicken pox   . Hyperlipidemia    Past Surgical History:  Procedure Laterality Date  . CHOLECYSTECTOMY  1998  . COLONOSCOPY    . SHOULDER ARTHROSCOPY WITH BANKART REPAIR Left 09/14/2017   Procedure: SHOULDER ARTHROSCOPY WITH MINI OPEN ROTATOR CUFF REPAIR, SUBACROMINAL DECOMPRESSION, LABRAL REPAIR,BICEP TENOTOMY, DISTAL CLAVICLE EXCISION ;  Surgeon: Thornton Park, MD;  Location: [facility] ORS;  Service: Orthopedics;  Laterality: Left;   Family History  Problem Relation Age of Onset  . Diabetes Father    Social History   Socioeconomic History  . Marital status: Married    Spouse name: Not on file  . Number of children: Not on file  . Years of education: Not on file  . Highest education level: Not on file  Occupational History  . Not on file  Social Needs  . Financial resource strain: Not on file  . Food insecurity:    Worry: Not on file    Inability: Not on file  . Transportation needs:    Medical: Not on file    Non-medical: Not on file  Tobacco Use  . Smoking status: Never Smoker  . Smokeless tobacco: Never Used  Substance and Sexual Activity  . Alcohol use: No    Alcohol/week: 0.0 standard drinks    Comment: occ  . Drug use: No  . Sexual activity: Not on file  Lifestyle  . Physical activity:    Days per week: Not on file    Minutes per session: Not on file  . Stress: Not on file    Relationships  . Social connections:    Talks on phone: Not on file    Gets together: Not on file    Attends religious service: Not on file    Active member of club or organization: Not on file    Attends meetings of clubs or organizations: Not on file    Relationship status: Not on file  Other Topics Concern  . Not on file  Social History Narrative  . Not on file    Outpatient Encounter Medications as of 03/08/2018  Medication Sig  . Omega-3 Fatty Acids (FISH OIL PO) Take 4,400 mg by mouth daily.  . [DISCONTINUED] Cholecalciferol (VITAMIN D) 2000 units CAPS Take 2,000 Units by mouth 2 (two) times daily.   No facility-administered encounter medications on file as of 03/08/2018.     Review of Systems  Constitutional: Negative for appetite change and unexpected weight change.  HENT: Negative for congestion and sinus pressure.   Eyes: Negative for pain and visual disturbance.  Respiratory: Negative for cough, chest tightness and shortness of breath.   Cardiovascular: Negative for chest pain, palpitations and leg swelling.  Gastrointestinal: Negative for abdominal pain, diarrhea and nausea.  Genitourinary: Negative for difficulty  urinating and dysuria.  Musculoskeletal: Negative for joint swelling and myalgias.  Skin: Negative for color change and rash.  Neurological: Negative for dizziness, light-headedness and headaches.  Hematological: Negative for adenopathy. Does not bruise/bleed easily.  Psychiatric/Behavioral: Negative for agitation and dysphoric mood.       Objective:    Physical Exam  Constitutional: He is oriented to person, place, and time. He appears well-developed and well-nourished. No distress.  HENT:  Head: Normocephalic and atraumatic.  Nose: Nose normal.  Mouth/Throat: Oropharynx is clear and moist. No oropharyngeal exudate.  Eyes: Conjunctivae are normal. Right eye exhibits no discharge. Left eye exhibits no discharge.  Neck: Neck supple. No thyromegaly  present.  Cardiovascular: Normal rate and regular rhythm.  Pulmonary/Chest: Breath sounds normal. No respiratory distress. He has no wheezes.  Abdominal: Soft. Bowel sounds are normal. There is no tenderness.  Genitourinary: Rectum normal and penis normal.  Musculoskeletal: He exhibits no edema or tenderness.  Lymphadenopathy:    He has no cervical adenopathy.  Neurological: He is alert and oriented to person, place, and time.  Skin: No rash noted. No erythema.  Psychiatric: He has a normal mood and affect. His behavior is normal.    BP 126/70 (BP Location: Left Arm, Patient Position: Sitting, Cuff Size: Normal)   Pulse 74   Temp 98.3 F (36.8 C) (Oral)   Resp 18   Ht 5' 7.5" (1.715 m)   Wt 166 lb 9.6 oz (75.6 kg)   SpO2 97%   BMI 25.71 kg/m  Wt Readings from Last 3 Encounters:  03/08/18 166 lb 9.6 oz (75.6 kg)  11/30/17 158 lb 4 oz (71.8 kg)  09/14/17 155 lb (70.3 kg)     Lab Results  Component Value Date   WBC 4.3 08/30/2017   HGB 15.5 08/30/2017   HCT 46.0 08/30/2017   PLT 215.0 08/30/2017   GLUCOSE 83 11/30/2017   CHOL 244 (H) 11/30/2017   TRIG 147.0 11/30/2017   HDL 43.20 11/30/2017   LDLCALC 171 (H) 11/30/2017   ALT 23 04/30/2017   AST 20 04/30/2017   NA 141 11/30/2017   K 4.3 11/30/2017   CL 104 11/30/2017   CREATININE 1.17 11/30/2017   BUN 17 11/30/2017   CO2 28 11/30/2017   TSH 1.78 12/23/2016   PSA 1.61 07/21/2016   INR 1.0 08/30/2017   HGBA1C 5.8 09/09/2017   MICROALBUR <0.7 07/20/2016    Dg Pelvis 1-2 Views  Addendum Date: 03/01/2018   ADDENDUM REPORT: 03/01/2018 07:12 ADDENDUM: The top of the left greater trochanter is 7.4 mm higher than the top of the right greater trochanter. Electronically Signed   By: Dorise Bullion III M.D   On: 03/01/2018 07:12   Result Date: 03/01/2018 CLINICAL DATA:  Leg-length discrepancy. EXAM: PELVIS - 1-2 VIEW COMPARISON:  None. FINDINGS: The left greater trochanter may be slightly higher than the right, less than  10 mm difference. No significant degenerative changes identified. No loss of joint space. The iliac bones, sacrum, lower lumbar spine, and SI joints are unremarkable. No other acute abnormalities. IMPRESSION: The left greater trochanter appears slightly higher than the right. No other significant abnormalities. Electronically Signed: By: Dorise Bullion III M.D On: 02/14/2018 16:56       Assessment & Plan:   Problem List Items Addressed This Visit    Anemia    Follow cbc.        Health care maintenance    Physical today 8//27/19.  Colonoscopy 10/31/13.  Check psa with  next labs.        History of colonic polyps    Colonoscopy 10/2013.  Recommended f/u colonoscopy in 10 years.        Hypercholesterolemia    Low cholesterol diet and exercise.  Desires not tot take cholesterol medication.  Follow lipid panel.        Relevant Orders   Hepatic function panel   Lipid panel   TSH   Hyperglycemia    Low carb diet and exercise.  Follow met b and a1c.        Relevant Orders   Hemoglobin O7P   Basic metabolic panel    Other Visit Diagnoses    Routine general medical examination at a health care facility    -  Primary       [name], MD

## 2018-03-08 NOTE — Assessment & Plan Note (Addendum)
Physical today 8//27/19.  Colonoscopy 10/31/13.  Check psa with next labs.

## 2018-03-13 ENCOUNTER — Encounter: Payer: Self-pay | Admitting: Internal Medicine

## 2018-03-13 NOTE — Assessment & Plan Note (Signed)
Colonoscopy 10/2013.  Recommended f/u colonoscopy in 10 years.

## 2018-03-13 NOTE — Assessment & Plan Note (Signed)
Low carb diet and exercise.  Follow met b and a1c.

## 2018-03-13 NOTE — Assessment & Plan Note (Signed)
Low cholesterol diet and exercise.  Desires not tot take cholesterol medication.  Follow lipid panel.

## 2018-03-13 NOTE — Assessment & Plan Note (Signed)
Follow cbc.  

## 2018-03-21 DIAGNOSIS — M25612 Stiffness of left shoulder, not elsewhere classified: Secondary | ICD-10-CM | POA: Diagnosis not present

## 2018-03-21 DIAGNOSIS — M25512 Pain in left shoulder: Secondary | ICD-10-CM | POA: Diagnosis not present

## 2018-03-24 ENCOUNTER — Other Ambulatory Visit (INDEPENDENT_AMBULATORY_CARE_PROVIDER_SITE_OTHER): Payer: PPO

## 2018-03-24 ENCOUNTER — Ambulatory Visit (INDEPENDENT_AMBULATORY_CARE_PROVIDER_SITE_OTHER): Payer: PPO

## 2018-03-24 DIAGNOSIS — E7800 Pure hypercholesterolemia, unspecified: Secondary | ICD-10-CM

## 2018-03-24 DIAGNOSIS — Z23 Encounter for immunization: Secondary | ICD-10-CM

## 2018-03-24 DIAGNOSIS — R739 Hyperglycemia, unspecified: Secondary | ICD-10-CM

## 2018-03-24 LAB — LIPID PANEL
CHOLESTEROL: 238 mg/dL — AB (ref 0–200)
HDL: 42.3 mg/dL (ref >39.00)
LDL Cholesterol: 175 mg/dL — ABNORMAL HIGH (ref 0–99)
NONHDL: 195.63
TRIGLYCERIDES: 103 mg/dL (ref 0.0–149.0)
Total CHOL/HDL Ratio: 6
VLDL: 20.6 mg/dL (ref 0.0–40.0)

## 2018-03-24 LAB — BASIC METABOLIC PANEL
BUN: 16 mg/dL (ref 6–23)
CO2: 28 mEq/L (ref 19–32)
Calcium: 9.5 mg/dL (ref 8.4–10.5)
Chloride: 105 mEq/L (ref 96–112)
Creatinine, Ser: 1.2 mg/dL (ref 0.40–1.50)
GFR: 64.24 mL/min (ref >60.00)
Glucose, Bld: 107 mg/dL — ABNORMAL HIGH (ref 70–99)
POTASSIUM: 4.5 meq/L (ref 3.5–5.1)
SODIUM: 140 meq/L (ref 135–145)

## 2018-03-24 LAB — HEPATIC FUNCTION PANEL
ALK PHOS: 59 U/L (ref 39–117)
ALT: 24 U/L (ref 0–53)
AST: 17 U/L (ref 0–37)
Albumin: 4.1 g/dL (ref 3.5–5.2)
Bilirubin, Direct: 0.2 mg/dL (ref 0.0–0.3)
Total Bilirubin: 1.3 mg/dL — ABNORMAL HIGH (ref 0.2–1.2)
Total Protein: 6.7 g/dL (ref 6.0–8.3)

## 2018-03-24 LAB — TSH: TSH: 2.46 u[IU]/mL (ref 0.35–4.50)

## 2018-03-24 LAB — HEMOGLOBIN A1C: Hgb A1c MFr Bld: 5.9 % (ref 4.6–6.5)

## 2018-03-24 NOTE — Progress Notes (Signed)
Patient comes in for pneumovax. Injected right deltoid . Patient tolerated injection well.

## 2018-03-25 ENCOUNTER — Other Ambulatory Visit: Payer: Self-pay | Admitting: Internal Medicine

## 2018-03-25 NOTE — Progress Notes (Signed)
Order placed for f/u liver panel.  

## 2018-03-28 ENCOUNTER — Encounter: Payer: Self-pay | Admitting: Internal Medicine

## 2018-03-28 ENCOUNTER — Other Ambulatory Visit: Payer: Self-pay

## 2018-03-28 MED ORDER — ROSUVASTATIN CALCIUM 10 MG PO TABS: 10.0000 mg | ORAL_TABLET | Freq: Every day | ORAL | 1 refills | Status: DC

## 2018-04-04 DIAGNOSIS — M25512 Pain in left shoulder: Secondary | ICD-10-CM | POA: Diagnosis not present

## 2018-04-04 DIAGNOSIS — M25612 Stiffness of left shoulder, not elsewhere classified: Secondary | ICD-10-CM | POA: Diagnosis not present

## 2018-04-19 ENCOUNTER — Other Ambulatory Visit: Payer: Self-pay | Admitting: Internal Medicine

## 2018-05-02 ENCOUNTER — Ambulatory Visit: Payer: PPO | Admitting: Family Medicine

## 2018-05-02 ENCOUNTER — Other Ambulatory Visit (INDEPENDENT_AMBULATORY_CARE_PROVIDER_SITE_OTHER): Payer: PPO

## 2018-05-02 ENCOUNTER — Encounter: Payer: Self-pay | Admitting: Internal Medicine

## 2018-05-02 LAB — HEPATIC FUNCTION PANEL
ALT: 21 U/L (ref 0–53)
AST: 17 U/L (ref 0–37)
Albumin: 4.2 g/dL (ref 3.5–5.2)
Alkaline Phosphatase: 58 U/L (ref 39–117)
BILIRUBIN DIRECT: 0.1 mg/dL (ref 0.0–0.3)
TOTAL PROTEIN: 6.8 g/dL (ref 6.0–8.3)
Total Bilirubin: 0.9 mg/dL (ref 0.2–1.2)

## 2018-05-03 ENCOUNTER — Encounter: Payer: Self-pay | Admitting: Internal Medicine

## 2018-05-03 ENCOUNTER — Other Ambulatory Visit: Payer: Self-pay | Admitting: Internal Medicine

## 2018-05-16 ENCOUNTER — Telehealth: Payer: Self-pay | Admitting: Internal Medicine

## 2018-05-17 ENCOUNTER — Ambulatory Visit (INDEPENDENT_AMBULATORY_CARE_PROVIDER_SITE_OTHER): Payer: PPO

## 2018-05-17 VITALS — BP 128/64 | HR 62 | Temp 97.7°F | Resp 15 | Ht 68.75 in | Wt 163.8 lb

## 2018-05-17 DIAGNOSIS — R739 Hyperglycemia, unspecified: Secondary | ICD-10-CM

## 2018-05-17 DIAGNOSIS — Z1159 Encounter for screening for other viral diseases: Secondary | ICD-10-CM | POA: Diagnosis not present

## 2018-05-17 DIAGNOSIS — Z0000 Encounter for general adult medical examination without abnormal findings: Secondary | ICD-10-CM | POA: Diagnosis not present

## 2018-05-17 LAB — BASIC METABOLIC PANEL
BUN: 14 mg/dL (ref 6–23)
CALCIUM: 9.5 mg/dL (ref 8.4–10.5)
CHLORIDE: 104 meq/L (ref 96–112)
CO2: 29 meq/L (ref 19–32)
Creatinine, Ser: 1.13 mg/dL (ref 0.40–1.50)
GFR: 68.83 mL/min (ref >60.00)
Glucose, Bld: 73 mg/dL (ref 70–99)
POTASSIUM: 4.1 meq/L (ref 3.5–5.1)
SODIUM: 139 meq/L (ref 135–145)

## 2018-05-17 NOTE — Telephone Encounter (Signed)
Erroneous encounter

## 2018-05-17 NOTE — Patient Instructions (Addendum)
  [name] , Thank you for taking time to come for your Medicare Wellness Visit. I appreciate your ongoing commitment to your health goals. Please review the following plan we discussed and let me know if I can assist you in the future.   Follow up as needed.    Have a great day!  These are the goals we discussed: Goals      Patient Stated   . Stretch before and after activity (pt-stated)     Wear orthotics daily        This is a list of the screening recommended for you and due dates:  Health Maintenance  Topic Date Due  . Tetanus Vaccine  07/04/2018  . Colon Cancer Screening  11/01/2023  . Flu Shot  Completed  .  Hepatitis C: One time screening is recommended by Center for Disease Control  (CDC) for  adults born from [birth year] through 1965.   Completed  . Pneumonia vaccines  Completed

## 2018-05-17 NOTE — Progress Notes (Signed)
Subjective:   Apollos Tenbrink is a 67 y.o. male who presents for Medicare Annual/Subsequent preventive examination.  Review of Systems:  No ROS.  Medicare Wellness Visit. Additional risk factors are reflected in the social history.  Cardiac Risk Factors include: advanced age (>46men, >37 women);male gender     Objective:    Vitals: BP 128/64 (BP Location: Left Arm, Patient Position: Sitting, Cuff Size: Normal)   Pulse 62   Temp 97.7 F (36.5 C) (Oral)   Resp 15   Ht 5' 8.75" (1.746 m)   Wt 163 lb 12.8 oz (74.3 kg)   SpO2 98%   BMI 24.37 kg/m   Body mass index is 24.37 kg/m.  Advanced Directives 05/17/2018 09/14/2017 09/07/2017  Does Patient Have a Medical Advance Directive? No No No  Would patient like information on creating a medical advance directive? No - Patient declined No - Patient declined No - Patient declined    Tobacco Social History   Tobacco Use  Smoking Status Never Smoker  Smokeless Tobacco Never Used     Counseling given: Not Answered   Clinical Intake:  Pre-visit preparation completed: Yes  Pain : No/denies pain     Nutritional Status: BMI 25 -29 Overweight Diabetes: No  How often do you need to have someone help you when you read instructions, pamphlets, or other written materials from your doctor or pharmacy?: 1 - Never  Interpreter Needed?: No     Past Medical History:  Diagnosis Date  . Blood in stool    H/O  . History of chicken pox   . Hyperlipidemia    Past Surgical History:  Procedure Laterality Date  . CHOLECYSTECTOMY  1998  . COLONOSCOPY    . SHOULDER ARTHROSCOPY WITH BANKART REPAIR Left 09/14/2017   Procedure: SHOULDER ARTHROSCOPY WITH MINI OPEN ROTATOR CUFF REPAIR, SUBACROMINAL DECOMPRESSION, LABRAL REPAIR,BICEP TENOTOMY, DISTAL CLAVICLE EXCISION ;  Surgeon: Thornton Park, MD;  Location: [facility] ORS;  Service: Orthopedics;  Laterality: Left;   Family History  Problem Relation Age of Onset  . Dementia Mother   . Diabetes  Father   . Colitis Sister   . Autoimmune disease Brother   . Colitis Daughter    Social History   Socioeconomic History  . Marital status: Married    Spouse name: Not on file  . Number of children: Not on file  . Years of education: Not on file  . Highest education level: Not on file  Occupational History  . Not on file  Social Needs  . Financial resource strain: Not hard at all  . Food insecurity:    Worry: Never true    Inability: Never true  . Transportation needs:    Medical: No    Non-medical: No  Tobacco Use  . Smoking status: Never Smoker  . Smokeless tobacco: Never Used  Substance and Sexual Activity  . Alcohol use: No    Alcohol/week: 0.0 standard drinks    Comment: occ  . Drug use: No  . Sexual activity: Not on file  Lifestyle  . Physical activity:    Days per week: 4 days    Minutes per session: 150+ min  . Stress: Not at all  Relationships  . Social connections:    Talks on phone: Not on file    Gets together: Not on file    Attends religious service: Not on file    Active member of club or organization: Not on file    Attends meetings of clubs or organizations:  Not on file    Relationship status: Married  Other Topics Concern  . Not on file  Social History Narrative  . Not on file    Outpatient Encounter Medications as of 05/17/2018  Medication Sig  . Omega-3 Fatty Acids (FISH OIL PO) Take 4,400 mg by mouth daily.  . rosuvastatin (CRESTOR) 10 MG tablet TAKE 1 TABLET BY MOUTH EVERY DAY   No facility-administered encounter medications on file as of 05/17/2018.     Activities of Daily Living In your present state of health, do you have any difficulty performing the following activities: 05/17/2018 09/07/2017  Hearing? N -  Vision? N -  Difficulty concentrating or making decisions? N -  Walking or climbing stairs? N N  Dressing or bathing? N -  Doing errands, shopping? N N  Preparing Food and eating ? N -  Using the Toilet? N -  In the past  six months, have you accidently leaked urine? N -  Do you have problems with loss of bowel control? N -  Managing your Medications? N -  Managing your Finances? N -  Housekeeping or managing your Housekeeping? N -  Some recent data might be hidden    Patient Care Team: [name], MD as PCP - General (Internal Medicine)   Assessment:   This is a routine wellness examination for Taeshawn. The goal of the wellness visit is to assist the patient how to close the gaps in care and create a preventative care plan for the patient.   He presents with no acute issues to be addressed by his doctor today. Requests BMP to check GFR. He plans to mychart his pcp regarding over grown skin on the toe.    Osteoporosis risk reviewed.    Safety issues reviewed; Smoke and carbon monoxide detectors in the home. Firearm safety discussed. Wears seatbelts when driving or riding with others. No violence in the home.  They do not have excessive sun exposure.  Discussed the need for sun protection: hats, long sleeves and the use of sunscreen if there is significant sun exposure.  Patient is alert, normal appearance, oriented to person/place/and time. Correctly identified the president of the [place] and recalls of 3/3 words. Performs simple calculations and can read correct time from watch face.  Displays appropriate judgement.  No new identified risk were noted.  No failures at ADL's or IADL's.    BMI- discussed the importance of a healthy diet, water intake and the benefits of aerobic exercise.   24 hour diet recall: Lower protein; higher carb diet.  States, "My kidney function is better eating this way."  Dental- every 6 months.  Eye- Visual acuity not assessed per patient preference since they have regular follow up with the ophthalmologist.  Wears corrective lenses.  Sleep patterns- Sleeps 7-8 hours at night.  Wakes feeling rested.   BMP ordered per patient request.   Hep C screening consent  given.   Health maintenance gaps- closed.  Medications- stopped taking Crestor x2 months. Monitoring diet.  Follow up with pcp as directed.   Exercise Activities and Dietary recommendations Current Exercise Habits: Home exercise routine, Type of exercise: yoga;walking;treadmill(physical therapy for shoulder), Intensity: Moderate  Goals      Patient Stated   . Stretch before and after activity (pt-stated)     Wear orthotics daily        Fall Risk Fall Risk  05/17/2018 02/25/2017 01/06/2016 08/14/2014  Falls in the past year? 0 No No No  Depression Screen PHQ 2/9 Scores 05/17/2018 02/25/2017 02/25/2017 01/06/2016  PHQ - 2 Score 0 0 0 0  PHQ- 9 Score - - 0 -    Cognitive Function MMSE - Mini Mental State Exam 05/17/2018  Orientation to time 5  Orientation to Place 5  Registration 3  Attention/ Calculation 5  Recall 3  Language- name 2 objects 2  Language- repeat 1  Language- follow 3 step command 3  Language- read & follow direction 1  Write a sentence 1  Copy design 1  Total score 30        Immunization History  Administered Date(s) Administered  . Influenza Whole 06/22/2016  . Influenza,inj,Quad PF,6+ Mos 05/08/2014, 05/22/2015  . Influenza-Unspecified 04/05/2013, 03/18/2017, 04/26/2018  . PPD Test 08/29/2014  . Pneumococcal Conjugate-13 09/01/2016  . Pneumococcal Polysaccharide-23 03/24/2018  . Tdap 07/04/2008  . Zoster 01/23/2011   Screening Tests Health Maintenance  Topic Date Due  . TETANUS/TDAP  07/04/2018  . COLONOSCOPY  11/01/2023  . INFLUENZA VACCINE  Completed  . Hepatitis C Screening  Completed  . PNA vac Low Risk Adult  Completed      Plan:   End of life planning; Advanced aging; Advanced directives discussed.  No HCPOA/Living Will.  Additional information declined at this time.  I have personally reviewed and noted the following in the patient's chart:   . Medical and social history . Use of alcohol, tobacco or illicit drugs  . Current  medications and supplements . Functional ability and status . Nutritional status . Physical activity . Advanced directives . List of other physicians . Hospitalizations, surgeries, and ER visits in previous 12 months . Vitals . Screenings to include cognitive, depression, and falls . Referrals and appointments  In addition, I have reviewed and discussed with patient certain preventive protocols, quality metrics, and best practice recommendations. A written personalized care plan for preventive services as well as general preventive health recommendations were provided to patient.     Varney Biles, LPN  50/09/[id number]   Reviewed above information.  Agree with assessment and plan.    Dr Nicki Reaper

## 2018-05-18 ENCOUNTER — Encounter: Payer: Self-pay | Admitting: Internal Medicine

## 2018-05-18 LAB — HEPATITIS C ANTIBODY
Hepatitis C Ab: NONREACTIVE
SIGNAL TO CUT-OFF: 0.01 (ref <1.00)

## 2018-05-23 ENCOUNTER — Encounter: Payer: Self-pay | Admitting: Internal Medicine

## 2018-05-23 DIAGNOSIS — R41840 Attention and concentration deficit: Secondary | ICD-10-CM

## 2018-05-23 NOTE — Telephone Encounter (Signed)
Order placed for referral to [facility].

## 2018-06-16 ENCOUNTER — Encounter: Payer: Self-pay | Admitting: Internal Medicine

## 2018-06-16 ENCOUNTER — Ambulatory Visit (INDEPENDENT_AMBULATORY_CARE_PROVIDER_SITE_OTHER): Payer: PPO | Admitting: Internal Medicine

## 2018-06-16 VITALS — BP 126/72 | HR 68 | Temp 97.5°F | Resp 16 | Wt 163.0 lb

## 2018-06-16 DIAGNOSIS — E7800 Pure hypercholesterolemia, unspecified: Secondary | ICD-10-CM | POA: Diagnosis not present

## 2018-06-16 DIAGNOSIS — R739 Hyperglycemia, unspecified: Secondary | ICD-10-CM | POA: Diagnosis not present

## 2018-06-16 DIAGNOSIS — Z125 Encounter for screening for malignant neoplasm of prostate: Secondary | ICD-10-CM | POA: Diagnosis not present

## 2018-06-16 DIAGNOSIS — H9203 Otalgia, bilateral: Secondary | ICD-10-CM

## 2018-06-16 NOTE — Progress Notes (Signed)
Patient ID: Elijah Walker, male   DOB: 08/13/1950, 67 y.o.   MRN: [id number]   Subjective:    Patient ID: Elijah Walker, male    DOB: [birth year], 67 y.o.   MRN: [id number]  HPI  Patient here as a work in with concerns regarding ear pain.   He reports that he was working at his garage 3-4 days ago.  A drop of rain water off the roof dropped down and landed in his ear.  1-2 days later, he noticed some discomfort in his left ear.  Left ear is better now.  Only minimal twinge now.  Some sore throat last pm.  Better this am.  Minimal pain in right ear.  Only twinges of pain.  No significant pain.  No sinus pressure.  No chest congestion or cough.  No sob.  No nausea or vomiting.  Bowels moving.  Feels better.  Some persistent discomfort left shoulder.  S/p surgery.  Improved rom.  No significant pain.     Past Medical History:  Diagnosis Date  . Blood in stool    H/O  . History of chicken pox   . Hyperlipidemia    Past Surgical History:  Procedure Laterality Date  . CHOLECYSTECTOMY  1998  . COLONOSCOPY    . SHOULDER ARTHROSCOPY WITH BANKART REPAIR Left 09/14/2017   Procedure: SHOULDER ARTHROSCOPY WITH MINI OPEN ROTATOR CUFF REPAIR, SUBACROMINAL DECOMPRESSION, LABRAL REPAIR,BICEP TENOTOMY, DISTAL CLAVICLE EXCISION ;  Surgeon: Thornton Park, MD;  Location: [facility];  Service: Orthopedics;  Laterality: Left;   Family History  Problem Relation Age of Onset  . Dementia Mother   . Diabetes Father   . Colitis Sister   . Autoimmune disease Brother   . Colitis Daughter    Social History   Socioeconomic History  . Marital status: Married    Spouse name: Not on file  . Number of children: Not on file  . Years of education: Not on file  . Highest education level: Not on file  Occupational History  . Not on file  Social Needs  . Financial resource strain: Not hard at all  . Food insecurity:    Worry: Never true    Inability: Never true  . Transportation needs:    Medical: No   Non-medical: No  Tobacco Use  . Smoking status: Never Smoker  . Smokeless tobacco: Never Used  Substance and Sexual Activity  . Alcohol use: No    Alcohol/week: 0.0 standard drinks    Comment: occ  . Drug use: No  . Sexual activity: Not on file  Lifestyle  . Physical activity:    Days per week: 4 days    Minutes per session: 150+ min  . Stress: Not at all  Relationships  . Social connections:    Talks on phone: Not on file    Gets together: Not on file    Attends religious service: Not on file    Active member of club or organization: Not on file    Attends meetings of clubs or organizations: Not on file    Relationship status: Married  Other Topics Concern  . Not on file  Social History Narrative  . Not on file    Outpatient Encounter Medications as of 06/16/2018  Medication Sig  . Omega-3 Fatty Acids (FISH OIL PO) Take 4,400 mg by mouth daily.  . [DISCONTINUED] rosuvastatin (CRESTOR) 10 MG tablet TAKE 1 TABLET BY MOUTH EVERY DAY   No facility-administered encounter medications on file as of  06/16/2018.     Review of Systems  Constitutional: Negative for appetite change and unexpected weight change.  HENT: Positive for sore throat. Negative for congestion.        Previous ear pain.   Respiratory: Negative for cough, chest tightness and shortness of breath.   Cardiovascular: Negative for chest pain and leg swelling.  Gastrointestinal: Negative for abdominal pain, diarrhea, nausea and vomiting.  Skin: Negative for color change and rash.  Neurological: Negative for dizziness and headaches.  Psychiatric/Behavioral: Negative for agitation and dysphoric mood.       Objective:    Physical Exam  Constitutional: He appears well-developed and well-nourished. No distress.  HENT:  Nose: Nose normal.  Mouth/Throat: Oropharynx is clear and moist.  Ears:  TMs clear.  No increased cerumen.  Canal - no increased erythema.    Neck: Neck supple.  Cardiovascular: Normal rate and  regular rhythm.  Pulmonary/Chest: Effort normal and breath sounds normal. No respiratory distress.  Lymphadenopathy:    He has no cervical adenopathy.  Skin: No rash noted. No erythema.  Psychiatric: He has a normal mood and affect. His behavior is normal.    BP 126/72 (BP Location: Left Arm, Patient Position: Sitting, Cuff Size: Normal)   Pulse 68   Temp (!) 97.5 F (36.4 C) (Oral)   Resp 16   Wt 163 lb (73.9 kg)   SpO2 98%   BMI 24.25 kg/m  Wt Readings from Last 3 Encounters:  06/16/18 163 lb (73.9 kg)  05/17/18 163 lb 12.8 oz (74.3 kg)  03/08/18 166 lb 9.6 oz (75.6 kg)     Lab Results  Component Value Date   WBC 4.3 08/30/2017   HGB 15.5 08/30/2017   HCT 46.0 08/30/2017   PLT 215.0 08/30/2017   GLUCOSE 73 05/17/2018   CHOL 238 (H) 03/24/2018   TRIG 103.0 03/24/2018   HDL 42.30 03/24/2018   LDLCALC 175 (H) 03/24/2018   ALT 21 05/02/2018   AST 17 05/02/2018   NA 139 05/17/2018   K 4.1 05/17/2018   CL 104 05/17/2018   CREATININE 1.13 05/17/2018   BUN 14 05/17/2018   CO2 29 05/17/2018   TSH 2.46 03/24/2018   PSA 1.61 07/21/2016   INR 1.0 08/30/2017   HGBA1C 5.9 03/24/2018   MICROALBUR <0.7 07/20/2016    Dg Pelvis 1-2 Views  Addendum Date: 03/01/2018   ADDENDUM REPORT: 03/01/2018 07:12 ADDENDUM: The top of the left greater trochanter is 7.4 mm higher than the top of the right greater trochanter. Electronically Signed   By: Dorise Bullion III M.D   On: 03/01/2018 07:12   Result Date: 03/01/2018 CLINICAL DATA:  Leg-length discrepancy. EXAM: PELVIS - 1-2 VIEW COMPARISON:  None. FINDINGS: The left greater trochanter may be slightly higher than the right, less than 10 mm difference. No significant degenerative changes identified. No loss of joint space. The iliac bones, sacrum, lower lumbar spine, and SI joints are unremarkable. No other acute abnormalities. IMPRESSION: The left greater trochanter appears slightly higher than the right. No other significant  abnormalities. Electronically Signed: By: Dorise Bullion III M.D On: 02/14/2018 16:56       Assessment & Plan:   Problem List Items Addressed This Visit    Ear pain    No increased erythema.  No evidence of infection.  Hold on further medication.  Follow.  Notify me if symptoms worsen or do not resolve.        Hypercholesterolemia    Low cholesterol diet and  exercise.  Off medication.  Discussed with him today.  Recheck cholesterol levels next labs.        Relevant Orders   CBC with Differential/Platelet   Hepatic function panel   Lipid panel   Basic metabolic panel   Hyperglycemia    Low carb diet and exercise.  Follow met b and a1c.        Relevant Orders   Hemoglobin A1c    Other Visit Diagnoses    Prostate cancer screening    -  Primary   Relevant Orders   PSA       [name], MD

## 2018-06-16 NOTE — Telephone Encounter (Signed)
Left message for patient to call back  

## 2018-06-16 NOTE — Telephone Encounter (Signed)
I can see him if needs to determine best treatment  Can work in at 1:30 today.

## 2018-06-19 ENCOUNTER — Encounter: Payer: Self-pay | Admitting: Internal Medicine

## 2018-06-19 DIAGNOSIS — H9209 Otalgia, unspecified ear: Secondary | ICD-10-CM | POA: Insufficient documentation

## 2018-06-19 NOTE — Assessment & Plan Note (Signed)
No increased erythema.  No evidence of infection.  Hold on further medication.  Follow.  Notify me if symptoms worsen or do not resolve.

## 2018-06-19 NOTE — Assessment & Plan Note (Signed)
Low carb diet and exercise.  Follow met b and a1c.

## 2018-06-19 NOTE — Assessment & Plan Note (Signed)
Low cholesterol diet and exercise.  Off medication.  Discussed with him today.  Recheck cholesterol levels next labs.

## 2018-09-05 ENCOUNTER — Ambulatory Visit: Payer: PPO | Admitting: Psychology

## 2018-09-09 ENCOUNTER — Ambulatory Visit (INDEPENDENT_AMBULATORY_CARE_PROVIDER_SITE_OTHER): Payer: PPO | Admitting: Psychology

## 2018-09-09 DIAGNOSIS — F3342 Major depressive disorder, recurrent, in full remission: Secondary | ICD-10-CM

## 2018-09-09 DIAGNOSIS — F909 Attention-deficit hyperactivity disorder, unspecified type: Secondary | ICD-10-CM | POA: Diagnosis not present

## 2018-09-13 ENCOUNTER — Other Ambulatory Visit: Payer: Self-pay

## 2018-09-13 ENCOUNTER — Encounter: Payer: Self-pay | Admitting: Internal Medicine

## 2018-09-13 ENCOUNTER — Other Ambulatory Visit (INDEPENDENT_AMBULATORY_CARE_PROVIDER_SITE_OTHER): Payer: PPO

## 2018-09-13 DIAGNOSIS — Z125 Encounter for screening for malignant neoplasm of prostate: Secondary | ICD-10-CM | POA: Diagnosis not present

## 2018-09-13 DIAGNOSIS — R739 Hyperglycemia, unspecified: Secondary | ICD-10-CM | POA: Diagnosis not present

## 2018-09-13 DIAGNOSIS — E7800 Pure hypercholesterolemia, unspecified: Secondary | ICD-10-CM | POA: Diagnosis not present

## 2018-09-13 LAB — BASIC METABOLIC PANEL
BUN: 13 mg/dL (ref 6–23)
CO2: 30 mEq/L (ref 19–32)
Calcium: 9.4 mg/dL (ref 8.4–10.5)
Chloride: 104 mEq/L (ref 96–112)
Creatinine, Ser: 1.09 mg/dL (ref 0.40–1.50)
GFR: 67.44 mL/min (ref >60.00)
GLUCOSE: 97 mg/dL (ref 70–99)
Potassium: 5 mEq/L (ref 3.5–5.1)
Sodium: 140 mEq/L (ref 135–145)

## 2018-09-13 LAB — HEPATIC FUNCTION PANEL
ALT: 22 U/L (ref 0–53)
AST: 16 U/L (ref 0–37)
Albumin: 4.3 g/dL (ref 3.5–5.2)
Alkaline Phosphatase: 67 U/L (ref 39–117)
BILIRUBIN DIRECT: 0.1 mg/dL (ref 0.0–0.3)
BILIRUBIN TOTAL: 0.8 mg/dL (ref 0.2–1.2)
Total Protein: 6.5 g/dL (ref 6.0–8.3)

## 2018-09-13 LAB — PSA: PSA: 1.48 ng/mL (ref 0.10–4.00)

## 2018-09-13 LAB — CBC WITH DIFFERENTIAL/PLATELET
Basophils Absolute: 0 10*3/uL (ref 0.0–0.1)
Basophils Relative: 0.6 % (ref 0.0–3.0)
EOS PCT: 2.2 % (ref 0.0–5.0)
Eosinophils Absolute: 0.1 10*3/uL (ref 0.0–0.7)
HCT: 47.7 % (ref 39.0–52.0)
Hemoglobin: 15.7 g/dL (ref 13.0–17.0)
LYMPHS PCT: 39.1 % (ref 12.0–46.0)
Lymphs Abs: 2 10*3/uL (ref 0.7–4.0)
MCHC: 32.9 g/dL (ref 30.0–36.0)
MCV: 97 fl (ref 78.0–100.0)
Monocytes Absolute: 0.4 10*3/uL (ref 0.1–1.0)
Monocytes Relative: 8.6 % (ref 3.0–12.0)
Neutro Abs: 2.5 10*3/uL (ref 1.4–7.7)
Neutrophils Relative %: 49.5 % (ref 43.0–77.0)
Platelets: 246 10*3/uL (ref 150.0–400.0)
RBC: 4.92 Mil/uL (ref 4.22–5.81)
RDW: 13.2 % (ref 11.5–15.5)
WBC: 5 10*3/uL (ref 4.0–10.5)

## 2018-09-13 LAB — LIPID PANEL
Cholesterol: 237 mg/dL — ABNORMAL HIGH (ref 0–200)
HDL: 46 mg/dL (ref >39.00)
LDL Cholesterol: 167 mg/dL — ABNORMAL HIGH (ref 0–99)
NonHDL: 191.04
Total CHOL/HDL Ratio: 5
Triglycerides: 121 mg/dL (ref 0.0–149.0)
VLDL: 24.2 mg/dL (ref 0.0–40.0)

## 2018-09-13 LAB — HEMOGLOBIN A1C: Hgb A1c MFr Bld: 5.9 % (ref 4.6–6.5)

## 2018-09-15 ENCOUNTER — Encounter: Payer: Self-pay | Admitting: Internal Medicine

## 2018-09-15 ENCOUNTER — Ambulatory Visit (INDEPENDENT_AMBULATORY_CARE_PROVIDER_SITE_OTHER): Payer: PPO | Admitting: Internal Medicine

## 2018-09-15 VITALS — BP 128/78 | HR 81 | Temp 98.0°F | Wt 160.4 lb

## 2018-09-15 DIAGNOSIS — L989 Disorder of the skin and subcutaneous tissue, unspecified: Secondary | ICD-10-CM

## 2018-09-15 DIAGNOSIS — R739 Hyperglycemia, unspecified: Secondary | ICD-10-CM

## 2018-09-15 DIAGNOSIS — E7800 Pure hypercholesterolemia, unspecified: Secondary | ICD-10-CM

## 2018-09-15 DIAGNOSIS — D649 Anemia, unspecified: Secondary | ICD-10-CM | POA: Diagnosis not present

## 2018-09-15 NOTE — Progress Notes (Signed)
Patient ID: Elijah Walker, male   DOB: 07/04/1951, 68 y.o.   MRN: [id number]   Subjective:    Patient ID: Elijah Walker, male    DOB: 1951/02/23, 68 y.o.   MRN: [id number]  HPI  Patient here for a scheduled follow up.  States he is doing relatively well.  Increased stress with family medical issues.  Discussed with him today.  He feels he is handling things relatively well.  Seeing a specialist now for evaluation of possible ADHD.  No chest pain.  No sob.  Stays active.  No acid reflux.  No abdominal pain.  Bowels moving.  No urine change.  Discussed recent labs. Discussed calculated cholesterol risk.  Discussed recommendation to start cholesterol medication.  He declines.  Has lesion under right eye.  Request referral back to Dr Phillip Heal.     Past Medical History:  Diagnosis Date  . Blood in stool    H/O  . History of chicken pox   . Hyperlipidemia    Past Surgical History:  Procedure Laterality Date  . CHOLECYSTECTOMY  1998  . COLONOSCOPY    . SHOULDER ARTHROSCOPY WITH BANKART REPAIR Left 09/14/2017   Procedure: SHOULDER ARTHROSCOPY WITH MINI OPEN ROTATOR CUFF REPAIR, SUBACROMINAL DECOMPRESSION, LABRAL REPAIR,BICEP TENOTOMY, DISTAL CLAVICLE EXCISION ;  Surgeon: Thornton Park, MD;  Location: [facility];  Service: Orthopedics;  Laterality: Left;   Family History  Problem Relation Age of Onset  . Dementia Mother   . Diabetes Father   . Colitis Sister   . Autoimmune disease Brother   . Colitis Daughter    Social History   Socioeconomic History  . Marital status: Married    Spouse name: Not on file  . Number of children: Not on file  . Years of education: Not on file  . Highest education level: Not on file  Occupational History  . Not on file  Social Needs  . Financial resource strain: Not hard at all  . Food insecurity:    Worry: Never true    Inability: Never true  . Transportation needs:    Medical: No    Non-medical: No  Tobacco Use  . Smoking status: Never Smoker  .  Smokeless tobacco: Never Used  Substance and Sexual Activity  . Alcohol use: No    Alcohol/week: 0.0 standard drinks    Comment: occ  . Drug use: No  . Sexual activity: Not on file  Lifestyle  . Physical activity:    Days per week: 4 days    Minutes per session: 150+ min  . Stress: Not at all  Relationships  . Social connections:    Talks on phone: Not on file    Gets together: Not on file    Attends religious service: Not on file    Active member of club or organization: Not on file    Attends meetings of clubs or organizations: Not on file    Relationship status: Married  Other Topics Concern  . Not on file  Social History Narrative  . Not on file    Outpatient Encounter Medications as of 09/15/2018  Medication Sig  . Omega-3 Fatty Acids (FISH OIL PO) Take 4,400 mg by mouth daily.   No facility-administered encounter medications on file as of 09/15/2018.     Review of Systems  Constitutional: Negative for appetite change and unexpected weight change.  HENT: Negative for congestion and sinus pressure.   Respiratory: Negative for cough, chest tightness and shortness of breath.   Cardiovascular:  Negative for chest pain, palpitations and leg swelling.  Gastrointestinal: Negative for abdominal pain, diarrhea and nausea.  Genitourinary: Negative for difficulty urinating and dysuria.  Musculoskeletal: Negative for joint swelling and myalgias.       Right shoulder doing better.    Skin: Negative for color change and rash.  Neurological: Negative for dizziness, light-headedness and headaches.  Psychiatric/Behavioral: Negative for agitation and dysphoric mood.       Objective:    Physical Exam Constitutional:      General: He is not in acute distress.    Appearance: Normal appearance. He is well-developed.  HENT:     Nose: Nose normal. No congestion.     Mouth/Throat:     Pharynx: No oropharyngeal exudate or posterior oropharyngeal erythema.  Neck:     Musculoskeletal:  Neck supple. No muscular tenderness.  Cardiovascular:     Rate and Rhythm: Normal rate and regular rhythm.  Pulmonary:     Effort: Pulmonary effort is normal. No respiratory distress.     Breath sounds: Normal breath sounds.  Abdominal:     General: Bowel sounds are normal.     Palpations: Abdomen is soft.     Tenderness: There is no abdominal tenderness.  Musculoskeletal:        General: No swelling or tenderness.  Skin:    Findings: No erythema or rash.  Neurological:     Mental Status: He is alert.  Psychiatric:        Mood and Affect: Mood normal.        Behavior: Behavior normal.     BP 128/78   Pulse 81   Temp 98 F (36.7 C) (Oral)   Wt 160 lb 6.4 oz (72.8 kg)   SpO2 99%   BMI 23.86 kg/m  Wt Readings from Last 3 Encounters:  09/15/18 160 lb 6.4 oz (72.8 kg)  06/16/18 163 lb (73.9 kg)  05/17/18 163 lb 12.8 oz (74.3 kg)     Lab Results  Component Value Date   WBC 5.0 09/13/2018   HGB 15.7 09/13/2018   HCT 47.7 09/13/2018   PLT 246.0 09/13/2018   GLUCOSE 97 09/13/2018   CHOL 237 (H) 09/13/2018   TRIG 121.0 09/13/2018   HDL 46.00 09/13/2018   LDLCALC 167 (H) 09/13/2018   ALT 22 09/13/2018   AST 16 09/13/2018   NA 140 09/13/2018   K 5.0 09/13/2018   CL 104 09/13/2018   CREATININE 1.09 09/13/2018   BUN 13 09/13/2018   CO2 30 09/13/2018   TSH 2.46 03/24/2018   PSA 1.48 09/13/2018   INR 1.0 08/30/2017   HGBA1C 5.9 09/13/2018   MICROALBUR <0.7 07/20/2016    Dg Pelvis 1-2 Views  Addendum Date: 03/01/2018   ADDENDUM REPORT: 03/01/2018 07:12 ADDENDUM: The top of the left greater trochanter is 7.4 mm higher than the top of the right greater trochanter. Electronically Signed   By: Dorise Bullion III M.D   On: 03/01/2018 07:12   Result Date: 03/01/2018 CLINICAL DATA:  Leg-length discrepancy. EXAM: PELVIS - 1-2 VIEW COMPARISON:  None. FINDINGS: The left greater trochanter may be slightly higher than the right, less than 10 mm difference. No significant  degenerative changes identified. No loss of joint space. The iliac bones, sacrum, lower lumbar spine, and SI joints are unremarkable. No other acute abnormalities. IMPRESSION: The left greater trochanter appears slightly higher than the right. No other significant abnormalities. Electronically Signed: By: Dorise Bullion III M.D On: 02/14/2018 16:56  Assessment & Plan:   Problem List Items Addressed This Visit    Anemia    Follow cbc.       Hypercholesterolemia    Discussed recent cholesterol labs.  Discussed calculated cholesterol risk.  Discussed recommendation to start cholesterol medication.  He declines.  Follow lipid panel.  Low cholesterol diet and exercise.        Relevant Orders   TSH   Lipid panel   Hepatic function panel   Hyperglycemia    Low carb diet and exercise.  Follow met b and a1c.       Relevant Orders   Hemoglobin J1E   Basic metabolic panel   Skin lesion - Primary    Lesion under right eye.  Request referral to dermatology.        Relevant Orders   Ambulatory referral to Dermatology       [name], MD

## 2018-09-16 ENCOUNTER — Ambulatory Visit: Payer: Self-pay | Admitting: Psychology

## 2018-09-17 NOTE — Assessment & Plan Note (Signed)
Lesion under right eye.  Request referral to dermatology.

## 2018-09-17 NOTE — Assessment & Plan Note (Signed)
Discussed recent cholesterol labs.  Discussed calculated cholesterol risk.  Discussed recommendation to start cholesterol medication.  He declines.  Follow lipid panel.  Low cholesterol diet and exercise.

## 2018-09-17 NOTE — Assessment & Plan Note (Signed)
Follow cbc.  

## 2018-09-17 NOTE — Assessment & Plan Note (Signed)
Low carb diet and exercise.  Follow met b and a1c.  

## 2018-09-21 DIAGNOSIS — B078 Other viral warts: Secondary | ICD-10-CM | POA: Diagnosis not present

## 2018-09-21 DIAGNOSIS — L84 Corns and callosities: Secondary | ICD-10-CM | POA: Diagnosis not present

## 2018-09-30 ENCOUNTER — Ambulatory Visit (INDEPENDENT_AMBULATORY_CARE_PROVIDER_SITE_OTHER): Payer: PPO | Admitting: Psychology

## 2018-09-30 DIAGNOSIS — F3342 Major depressive disorder, recurrent, in full remission: Secondary | ICD-10-CM | POA: Diagnosis not present

## 2018-09-30 DIAGNOSIS — F900 Attention-deficit hyperactivity disorder, predominantly inattentive type: Secondary | ICD-10-CM | POA: Diagnosis not present

## 2018-10-02 ENCOUNTER — Encounter: Payer: Self-pay | Admitting: Internal Medicine

## 2018-10-03 NOTE — Telephone Encounter (Signed)
Noted.  Hold until we receive.  

## 2018-10-03 NOTE — Telephone Encounter (Signed)
Please call pt and get more information.  Does this provider prescribe medication?  Need records to review.

## 2018-10-03 NOTE — Telephone Encounter (Signed)
Patient just saw Dr. Glennon Hamilton on 3/20. She does not prescribe medication. I will have them send over notes. Pt stated that this is not a big hurry

## 2018-10-03 NOTE — Telephone Encounter (Signed)
Notes received

## 2018-10-04 ENCOUNTER — Encounter: Payer: Self-pay | Admitting: Internal Medicine

## 2018-10-06 ENCOUNTER — Telehealth: Payer: Self-pay

## 2018-10-06 DIAGNOSIS — F909 Attention-deficit hyperactivity disorder, unspecified type: Secondary | ICD-10-CM

## 2018-10-06 DIAGNOSIS — F329 Major depressive disorder, single episode, unspecified: Secondary | ICD-10-CM

## 2018-10-06 DIAGNOSIS — F32A Depression, unspecified: Secondary | ICD-10-CM

## 2018-10-06 DIAGNOSIS — F419 Anxiety disorder, unspecified: Secondary | ICD-10-CM

## 2018-10-06 NOTE — Telephone Encounter (Signed)
Order placed for referral to Dr Nicolasa Ducking.

## 2018-10-06 NOTE — Telephone Encounter (Signed)
Order placed for psychiatry referral.   °

## 2018-10-06 NOTE — Telephone Encounter (Signed)
Called patient and discussed notes from DR. Glennon Hamilton. He is agreeable to the referral with Dr. Nicolasa Ducking. Advised referral would be placed and someone from her office would be contacting him with appt date and time

## 2018-10-26 ENCOUNTER — Encounter: Payer: Self-pay | Admitting: Internal Medicine

## 2018-11-04 ENCOUNTER — Other Ambulatory Visit: Payer: Self-pay

## 2018-11-04 ENCOUNTER — Ambulatory Visit (INDEPENDENT_AMBULATORY_CARE_PROVIDER_SITE_OTHER): Payer: PPO | Admitting: Internal Medicine

## 2018-11-04 ENCOUNTER — Encounter: Payer: Self-pay | Admitting: Internal Medicine

## 2018-11-04 DIAGNOSIS — J029 Acute pharyngitis, unspecified: Secondary | ICD-10-CM | POA: Diagnosis not present

## 2018-11-04 DIAGNOSIS — F909 Attention-deficit hyperactivity disorder, unspecified type: Secondary | ICD-10-CM | POA: Diagnosis not present

## 2018-11-04 NOTE — Progress Notes (Addendum)
Patient ID: Elijah Walker, male   DOB: Apr 28, 1951, 68 y.o.   MRN: [id number] Virtual Visit via Video Note  This visit type was conducted due to national recommendations for restrictions regarding the COVID-19 pandemic (e.g. social distancing).  This format is felt to be most appropriate for this patient at this time.  All issues noted in this document were discussed and addressed.  No physical exam was performed (except for noted visual exam findings with Video Visits).   I connected with Macio Kissoon on 11/04/18 at  3:30 PM EDT by a video and verified that I am speaking with the correct person using two identifiers. Location patient: home Location provider: work Persons participating in the virtual visit: patient, provider  I discussed the limitations, risks, security and privacy concerns of performing an evaluation and management service by video and the availability of in person appointments. The patient expressed understanding and agreed to proceed.   Reason for visit: work in appt.   HPI: He called in with concerns regarding a sore throat.  States symptoms started two days ago.  Localized to the right side.  No fever.  No sinus pressure.  No nasal congestion or drainage.  No chest congestion, cough or sob.  One isolated spot - back of her throat.  Eating well.  No acid reflux.  No vomiting or diarrhea.  Also discussed recent testing by Dr Glennon Hamilton.  Was diagnosed with ADHD. Notes reviewed.  Discussed treatment options.  Discussed starting adderall.  Discussed the medication and following his blood pressure and heart rate.  He will follow symptoms and will keep me posted on how he is doing.     ROS: See pertinent positives and negatives per HPI.  Past Medical History:  Diagnosis Date  . Blood in stool    H/O  . History of chicken pox   . Hyperlipidemia     Past Surgical History:  Procedure Laterality Date  . CHOLECYSTECTOMY  1998  . COLONOSCOPY    . SHOULDER ARTHROSCOPY WITH BANKART  REPAIR Left 09/14/2017   Procedure: SHOULDER ARTHROSCOPY WITH MINI OPEN ROTATOR CUFF REPAIR, SUBACROMINAL DECOMPRESSION, LABRAL REPAIR,BICEP TENOTOMY, DISTAL CLAVICLE EXCISION ;  Surgeon: Thornton Park, MD;  Location: [facility];  Service: Orthopedics;  Laterality: Left;    Family History  Problem Relation Age of Onset  . Dementia Mother   . Diabetes Father   . Colitis Sister   . Autoimmune disease Brother   . Colitis Daughter     SOCIAL HX: reviewed.    Current Outpatient Medications:  .  amphetamine-dextroamphetamine (ADDERALL XR) 10 MG 24 hr capsule, Take 1 capsule (10 mg total) by mouth daily., Disp: 30 capsule, Rfl: 0 .  nystatin (MYCOSTATIN) 100000 UNIT/ML suspension, 5cc's swish and spit tid prn, Disp: 60 mL, Rfl: 0 .  Omega-3 Fatty Acids (FISH OIL PO), Take 4,400 mg by mouth daily., Disp: , Rfl:   EXAM:  GENERAL: alert, oriented, appears well and in no acute distress  HEENT: atraumatic, conjunttiva clear, no obvious abnormalities on inspection of external nose and ears.  OP - no increased swelling or erythema visualized.    NECK: normal movements of the head and neck  LUNGS: on inspection no signs of respiratory distress, breathing rate appears normal, no obvious gross SOB, gasping or wheezing  CV: no obvious cyanosis  PSYCH/NEURO: pleasant and cooperative, no obvious depression or anxiety, speech and thought processing grossly intact  ASSESSMENT AND PLAN:  Discussed the following assessment and plan:  Attention deficit hyperactivity  disorder (ADHD), unspecified ADHD type  Sore throat  ADHD Recently diagnosed. Saw Dr Glennon Hamilton.  Discussed treatment options.  Will start adderall XR 10mg  q day.  Follow blood pressure and heart rate.  Call with update.  Adjust dosing as needed.    Sore throat Sore throat as outlined.  One irritated spot.  No fever.  Trial of nystatin suspension.  Follow.  Notify me if persistent irritation.      I discussed the assessment and  treatment plan with the patient. The patient was provided an opportunity to ask questions and all were answered. The patient agreed with the plan and demonstrated an understanding of the instructions.   The patient was advised to call back or seek an in-person evaluation if the symptoms worsen or if the condition fails to improve as anticipated.    [name], MD

## 2018-11-04 NOTE — Telephone Encounter (Signed)
Pt scheduled  

## 2018-11-04 NOTE — Telephone Encounter (Signed)
If needs, I can do a virtual visit with him at 3:30.  Can also discuss starting adderall if desires.  Let me know if a problem.

## 2018-11-05 ENCOUNTER — Encounter: Payer: Self-pay | Admitting: Internal Medicine

## 2018-11-05 MED ORDER — AMPHETAMINE-DEXTROAMPHET ER 10 MG PO CP24: 10.0000 mg | ORAL_CAPSULE | Freq: Every day | ORAL | 0 refills | Status: DC

## 2018-11-05 MED ORDER — NYSTATIN 100000 UNIT/ML MT SUSP: OROMUCOSAL | 0 refills | Status: DC

## 2018-11-06 ENCOUNTER — Encounter: Payer: Self-pay | Admitting: Internal Medicine

## 2018-11-06 DIAGNOSIS — J029 Acute pharyngitis, unspecified: Secondary | ICD-10-CM | POA: Insufficient documentation

## 2018-11-06 DIAGNOSIS — F909 Attention-deficit hyperactivity disorder, unspecified type: Secondary | ICD-10-CM | POA: Insufficient documentation

## 2018-11-06 NOTE — Assessment & Plan Note (Signed)
Sore throat as outlined.  One irritated spot.  No fever.  Trial of nystatin suspension.  Follow.  Notify me if persistent irritation.

## 2018-11-06 NOTE — Assessment & Plan Note (Signed)
Recently diagnosed. Saw Dr Glennon Hamilton.  Discussed treatment options.  Will start adderall XR 10mg  q day.  Follow blood pressure and heart rate.  Call with update.  Adjust dosing as needed.

## 2018-11-29 ENCOUNTER — Encounter: Payer: Self-pay | Admitting: Internal Medicine

## 2018-11-30 NOTE — Telephone Encounter (Signed)
See if he would be agreeable to schedule a virtual visit to discuss the ADHD and his blood pressure.  If so, please schedule within the next week.  Also have him spot check his pressure prior to his appt.

## 2018-12-01 NOTE — Telephone Encounter (Signed)
Called and spoke to pt.  Pt scheduled a virtual visit on 12/06/18 @ 4:00 pm.  Pt aware to spot check bp and keep log prior to appt.

## 2018-12-06 ENCOUNTER — Other Ambulatory Visit: Payer: Self-pay

## 2018-12-06 ENCOUNTER — Encounter: Payer: Self-pay | Admitting: Internal Medicine

## 2018-12-06 ENCOUNTER — Ambulatory Visit (INDEPENDENT_AMBULATORY_CARE_PROVIDER_SITE_OTHER): Payer: PPO | Admitting: Internal Medicine

## 2018-12-06 DIAGNOSIS — F909 Attention-deficit hyperactivity disorder, unspecified type: Secondary | ICD-10-CM | POA: Diagnosis not present

## 2018-12-06 DIAGNOSIS — R739 Hyperglycemia, unspecified: Secondary | ICD-10-CM

## 2018-12-06 DIAGNOSIS — D649 Anemia, unspecified: Secondary | ICD-10-CM

## 2018-12-06 DIAGNOSIS — E7800 Pure hypercholesterolemia, unspecified: Secondary | ICD-10-CM

## 2018-12-06 DIAGNOSIS — R030 Elevated blood-pressure reading, without diagnosis of hypertension: Secondary | ICD-10-CM

## 2018-12-06 DIAGNOSIS — M25569 Pain in unspecified knee: Secondary | ICD-10-CM | POA: Diagnosis not present

## 2018-12-06 NOTE — Progress Notes (Signed)
Patient ID: Elijah Walker, male   DOB: 1950/11/14, 68 y.o.   MRN: [id number]   Virtual Visit via video Note  This visit type was conducted due to national recommendations for restrictions regarding the COVID-19 pandemic (e.g. social distancing).  This format is felt to be most appropriate for this patient at this time.  All issues noted in this document were discussed and addressed.  No physical exam was performed (except for noted visual exam findings with Video Visits).   I connected with Elijah Walker by a video enabled telemedicine application and verified that I am speaking with the correct person using two identifiers. Location patient: home Location provider: work Persons participating in the virtual visit: patient, provider  I discussed the limitations, risks, security and privacy concerns of performing an evaluation and management service by video and the availability of in person appointments.  The patient expressed understanding and agreed to proceed.  Interactive audio and video telecommunications were attempted between this provider and patient and was successful through most of the visit.  Due to increased static at the end of the visit,   we continued and completed visit with audio only. Pt was in agreement.   Reason for visit: acute visit.   HPI: He has been taking adderall XR 34m q day.  Cannot tell a big change.  Tolerating the medication.  Discussed increasing the dose.  He desires to increase to 240mq day.  Stays active.  No chest pain.  No sob.  No acid reflux.  No abdominal pain.  Bowels moving.  Previous sore throat - resolved.  Blood pressure was slightly increased on a couple of checks - 145/85, 140/80 - previously.  Now lood pressure averaging 127-134/70s.  129/79 today.  No headache.  No dizziness.  Has noticed some knee discomfort.  Notices when getting up.  Once moving- better.  Does not limit his activity.  Desires no further intervention at this time.     ROS:  See pertinent positives and negatives per HPI.  Past Medical History:  Diagnosis Date  . Blood in stool    H/O  . History of chicken pox   . Hyperlipidemia     Past Surgical History:  Procedure Laterality Date  . CHOLECYSTECTOMY  1998  . COLONOSCOPY    . SHOULDER ARTHROSCOPY WITH BANKART REPAIR Left 09/14/2017   Procedure: SHOULDER ARTHROSCOPY WITH MINI OPEN ROTATOR CUFF REPAIR, SUBACROMINAL DECOMPRESSION, LABRAL REPAIR,BICEP TENOTOMY, DISTAL CLAVICLE EXCISION ;  Surgeon: KrThornton ParkMD;  Location: [facility];  Service: Orthopedics;  Laterality: Left;    Family History  Problem Relation Age of Onset  . Dementia Mother   . Diabetes Father   . Colitis Sister   . Autoimmune disease Brother   . Colitis Daughter     SOCIAL HX: reviewed.    Current Outpatient Medications:  .  Omega-3 Fatty Acids (FISH OIL PO), Take 4,400 mg by mouth daily., Disp: , Rfl:  .  amphetamine-dextroamphetamine (ADDERALL XR) 20 MG 24 hr capsule, Take 1 capsule (20 mg total) by mouth every morning., Disp: 30 capsule, Rfl: 0  EXAM:  VITALS per patient if applicable: 12681/279751-70%68-70 and 158 pounds.    GENERAL: alert, oriented, appears well and in no acute distress  HEENT: atraumatic, conjunttiva clear, no obvious abnormalities on inspection of external nose and ears  NECK: normal movements of the head and neck  LUNGS: on inspection no signs of respiratory distress, breathing rate appears normal, no obvious gross SOB, gasping or  wheezing  CV: no obvious cyanosis  PSYCH/NEURO: pleasant and cooperative, no obvious depression or anxiety, speech and thought processing grossly intact  ASSESSMENT AND PLAN:  Discussed the following assessment and plan:  Attention deficit hyperactivity disorder (ADHD), unspecified ADHD type  Anemia, unspecified type  Elevated blood pressure reading  Hypercholesterolemia  Hyperglycemia  Knee pain, unspecified chronicity, unspecified laterality  ADHD  Was diagnosed - Dr Glennon Hamilton.  Increase adderall XR to 60m q day.  Follow blood pressure.  Keep me posted on how he is responding.  Follow.   Anemia Follow cbc.   Elevated blood pressure reading Blood pressure as outlined.  Recent checks wnl.  Follow.    Hypercholesterolemia Have discussed calculated cholesterol risk.  He has declined to start a cholesterol medication.  Low cholesterol diet and exercise.  Follow lipid panel.   Hyperglycemia Low carb diet and exercise.  Follow met b and a1c.   Knee pain Knee pain as outlined.  Desires no further intervention.  Follow.     I discussed the assessment and treatment plan with the patient. The patient was provided an opportunity to ask questions and all were answered. The patient agreed with the plan and demonstrated an understanding of the instructions.   The patient was advised to call back or seek an in-person evaluation if the symptoms worsen or if the condition fails to improve as anticipated.  I provided 20 minutes of non-face-to-face time during this encounter.   CEinar Pheasant MD

## 2018-12-07 MED ORDER — AMPHETAMINE-DEXTROAMPHET ER 20 MG PO CP24: 20.0000 mg | ORAL_CAPSULE | ORAL | 0 refills | Status: DC

## 2018-12-10 ENCOUNTER — Encounter: Payer: Self-pay | Admitting: Internal Medicine

## 2018-12-10 DIAGNOSIS — M25569 Pain in unspecified knee: Secondary | ICD-10-CM | POA: Insufficient documentation

## 2018-12-10 NOTE — Assessment & Plan Note (Signed)
Was diagnosed - Dr Glennon Hamilton.  Increase adderall XR to 20mg  q day.  Follow blood pressure.  Keep me posted on how he is responding.  Follow.

## 2018-12-10 NOTE — Assessment & Plan Note (Signed)
Follow cbc.  

## 2018-12-10 NOTE — Assessment & Plan Note (Signed)
Have discussed calculated cholesterol risk.  He has declined to start a cholesterol medication.  Low cholesterol diet and exercise.  Follow lipid panel.

## 2018-12-10 NOTE — Assessment & Plan Note (Signed)
Low carb diet and exercise.  Follow met b and a1c.  

## 2018-12-10 NOTE — Assessment & Plan Note (Signed)
Blood pressure as outlined.  Recent checks wnl.  Follow.

## 2018-12-10 NOTE — Assessment & Plan Note (Signed)
Knee pain as outlined.  Desires no further intervention.  Follow.

## 2019-01-09 ENCOUNTER — Encounter: Payer: Self-pay | Admitting: Internal Medicine

## 2019-01-10 MED ORDER — AMPHETAMINE-DEXTROAMPHET ER 20 MG PO CP24: 20.0000 mg | ORAL_CAPSULE | ORAL | 0 refills | Status: DC

## 2019-01-10 NOTE — Telephone Encounter (Signed)
I have sent in the prescription for the adderall.

## 2019-01-24 ENCOUNTER — Encounter: Payer: Self-pay | Admitting: Internal Medicine

## 2019-02-14 ENCOUNTER — Encounter: Payer: Self-pay | Admitting: Internal Medicine

## 2019-02-14 MED ORDER — AMPHETAMINE-DEXTROAMPHET ER 20 MG PO CP24: 20.0000 mg | ORAL_CAPSULE | ORAL | 0 refills | Status: DC

## 2019-02-14 NOTE — Telephone Encounter (Signed)
rx sent in for adderall 20mg  #30 with no refills.

## 2019-02-28 ENCOUNTER — Encounter: Payer: Self-pay | Admitting: Internal Medicine

## 2019-03-01 NOTE — Telephone Encounter (Signed)
Can schedule appt to discuss and we can arrange for labs.  See what he prefers.

## 2019-03-03 ENCOUNTER — Ambulatory Visit (INDEPENDENT_AMBULATORY_CARE_PROVIDER_SITE_OTHER): Payer: PPO | Admitting: Internal Medicine

## 2019-03-03 ENCOUNTER — Encounter: Payer: Self-pay | Admitting: Internal Medicine

## 2019-03-03 DIAGNOSIS — E7800 Pure hypercholesterolemia, unspecified: Secondary | ICD-10-CM

## 2019-03-03 DIAGNOSIS — F909 Attention-deficit hyperactivity disorder, unspecified type: Secondary | ICD-10-CM | POA: Diagnosis not present

## 2019-03-03 NOTE — Progress Notes (Signed)
Patient ID: Elijah Walker, male   DOB: 02-Oct-1950, 68 y.o.   MRN: [id number]   Virtual Visit via video Note  This visit type was conducted due to national recommendations for restrictions regarding the COVID-19 pandemic (e.g. social distancing).  This format is felt to be most appropriate for this patient at this time.  All issues noted in this document were discussed and addressed.  No physical exam was performed (except for noted visual exam findings with Video Visits).   I connected with Elijah Walker by a video enabled telemedicine application or telephone and verified that I am speaking with the correct person using two identifiers. Location patient: home Location provider: work  Persons participating in the virtual visit: patient, provider  I discussed the limitations, risks, security and privacy concerns of performing an evaluation and management service by video and the availability of in person appointments.  The patient expressed understanding and agreed to proceed.   Reason for visit: worki n appt  HPI: Requested appt to discuss covid, treatment and to f/u regarding his ADHD.  Had questions regarding treatment for covid.  Concerned regarding doing everything possible to reduce risk.  They are trying to stay in due to covid restrictions.  No symptoms.  No known exposures.  No fever.  No cough, congestion or sob.  Discussed treatment and ongoing studies, etc.  He feels he is doing better on adderall.  Discussed adjusting the dose.  He prefers to keep the dose as he is taking for now.  Blood pressures doing well.  Blood pressures averaging 128/78. Stays active.  No chest pain.  No sob.  No acid reflux.  No abdominal pain.  Bowels moving.     ROS: See pertinent positives and negatives per HPI.  Past Medical History:  Diagnosis Date  . Blood in stool    H/O  . History of chicken pox   . Hyperlipidemia     Past Surgical History:  Procedure Laterality Date  . CHOLECYSTECTOMY  1998   . COLONOSCOPY    . SHOULDER ARTHROSCOPY WITH BANKART REPAIR Left 09/14/2017   Procedure: SHOULDER ARTHROSCOPY WITH MINI OPEN ROTATOR CUFF REPAIR, SUBACROMINAL DECOMPRESSION, LABRAL REPAIR,BICEP TENOTOMY, DISTAL CLAVICLE EXCISION ;  Surgeon: Thornton Park, MD;  Location: [facility];  Service: Orthopedics;  Laterality: Left;    Family History  Problem Relation Age of Onset  . Dementia Mother   . Diabetes Father   . Colitis Sister   . Autoimmune disease Brother   . Colitis Daughter     SOCIAL HX: reviewed.    Current Outpatient Medications:  .  amphetamine-dextroamphetamine (ADDERALL XR) 20 MG 24 hr capsule, Take 1 capsule (20 mg total) by mouth every morning., Disp: 30 capsule, Rfl: 0 .  Omega-3 Fatty Acids (FISH OIL PO), Take 4,400 mg by mouth daily., Disp: , Rfl:   EXAM:  VITALS per patient if applicable:  [id number]  GENERAL: alert, oriented, appears well and in no acute distress  HEENT: atraumatic, conjunttiva clear, no obvious abnormalities on inspection of external nose and ears  NECK: normal movements of the head and neck  LUNGS: on inspection no signs of respiratory distress, breathing rate appears normal, no obvious gross SOB, gasping or wheezing  CV: no obvious cyanosis  PSYCH/NEURO: pleasant and cooperative, no obvious depression or anxiety, speech and thought processing grossly intact  ASSESSMENT AND PLAN:  Discussed the following assessment and plan:  ADHD Diagnosed by Dr Glennon Hamilton.  On adderall.  Doing well on the medication.  Desires  to keep the dose as he is currently taking.  Follow pressures.  Follow metabolic panel.    Hypercholesterolemia Low cholesterol diet and exercise.  Follow lipid panel.    COVID questions.  Time spent discussing covid.  Also discussed current treatment and precautions.  Questions answered.     I discussed the assessment and treatment plan with the patient. The patient was provided an opportunity to ask questions and all were answered.  The patient agreed with the plan and demonstrated an understanding of the instructions.   The patient was advised to call back or seek an in-person evaluation if the symptoms worsen or if the condition fails to improve as anticipated.   [name], MD

## 2019-03-06 ENCOUNTER — Encounter: Payer: Self-pay | Admitting: Internal Medicine

## 2019-03-06 NOTE — Assessment & Plan Note (Signed)
Diagnosed by Dr Glennon Hamilton.  On adderall.  Doing well on the medication.  Desires to keep the dose as he is currently taking.  Follow pressures.  Follow metabolic panel.

## 2019-03-06 NOTE — Assessment & Plan Note (Signed)
Low cholesterol diet and exercise.  Follow lipid panel.   

## 2019-03-11 ENCOUNTER — Encounter: Payer: Self-pay | Admitting: Internal Medicine

## 2019-03-21 ENCOUNTER — Other Ambulatory Visit: Payer: Self-pay

## 2019-03-21 ENCOUNTER — Other Ambulatory Visit (INDEPENDENT_AMBULATORY_CARE_PROVIDER_SITE_OTHER): Payer: PPO

## 2019-03-21 DIAGNOSIS — Z23 Encounter for immunization: Secondary | ICD-10-CM | POA: Diagnosis not present

## 2019-03-21 DIAGNOSIS — E7800 Pure hypercholesterolemia, unspecified: Secondary | ICD-10-CM | POA: Diagnosis not present

## 2019-03-21 DIAGNOSIS — R739 Hyperglycemia, unspecified: Secondary | ICD-10-CM

## 2019-03-21 LAB — BASIC METABOLIC PANEL
BUN: 14 mg/dL (ref 6–23)
CO2: 30 mEq/L (ref 19–32)
Calcium: 9.4 mg/dL (ref 8.4–10.5)
Chloride: 104 mEq/L (ref 96–112)
Creatinine, Ser: 1.15 mg/dL (ref 0.40–1.50)
GFR: 63.3 mL/min (ref >60.00)
Glucose, Bld: 105 mg/dL — ABNORMAL HIGH (ref 70–99)
Potassium: 4.8 mEq/L (ref 3.5–5.1)
Sodium: 141 mEq/L (ref 135–145)

## 2019-03-21 LAB — HEPATIC FUNCTION PANEL
ALT: 29 U/L (ref 0–53)
AST: 18 U/L (ref 0–37)
Albumin: 4.2 g/dL (ref 3.5–5.2)
Alkaline Phosphatase: 66 U/L (ref 39–117)
Bilirubin, Direct: 0.1 mg/dL (ref 0.0–0.3)
Total Bilirubin: 1.1 mg/dL (ref 0.2–1.2)
Total Protein: 6.5 g/dL (ref 6.0–8.3)

## 2019-03-21 LAB — LIPID PANEL
Cholesterol: 276 mg/dL — ABNORMAL HIGH (ref 0–200)
HDL: 44.2 mg/dL (ref >39.00)
LDL Cholesterol: 210 mg/dL — ABNORMAL HIGH (ref 0–99)
NonHDL: 232.14
Total CHOL/HDL Ratio: 6
Triglycerides: 109 mg/dL (ref 0.0–149.0)
VLDL: 21.8 mg/dL (ref 0.0–40.0)

## 2019-03-21 LAB — HEMOGLOBIN A1C: Hgb A1c MFr Bld: 6 % (ref 4.6–6.5)

## 2019-03-21 LAB — TSH: TSH: 2.22 u[IU]/mL (ref 0.35–4.50)

## 2019-03-22 ENCOUNTER — Encounter: Payer: Self-pay | Admitting: Internal Medicine

## 2019-03-23 ENCOUNTER — Other Ambulatory Visit: Payer: Self-pay

## 2019-03-23 ENCOUNTER — Encounter: Payer: Self-pay | Admitting: Internal Medicine

## 2019-03-23 ENCOUNTER — Ambulatory Visit (INDEPENDENT_AMBULATORY_CARE_PROVIDER_SITE_OTHER): Payer: PPO | Admitting: Internal Medicine

## 2019-03-23 VITALS — BP 126/70 | HR 76 | Temp 97.4°F | Resp 16 | Wt 159.2 lb

## 2019-03-23 DIAGNOSIS — R739 Hyperglycemia, unspecified: Secondary | ICD-10-CM

## 2019-03-23 DIAGNOSIS — F909 Attention-deficit hyperactivity disorder, unspecified type: Secondary | ICD-10-CM

## 2019-03-23 DIAGNOSIS — E7800 Pure hypercholesterolemia, unspecified: Secondary | ICD-10-CM

## 2019-03-23 DIAGNOSIS — Z0000 Encounter for general adult medical examination without abnormal findings: Secondary | ICD-10-CM

## 2019-03-23 DIAGNOSIS — D649 Anemia, unspecified: Secondary | ICD-10-CM

## 2019-03-23 MED ORDER — ATORVASTATIN CALCIUM 10 MG PO TABS: ORAL_TABLET | ORAL | 2 refills | Status: DC

## 2019-03-23 MED ORDER — AMPHETAMINE-DEXTROAMPHET ER 20 MG PO CP24: 20.0000 mg | ORAL_CAPSULE | ORAL | 0 refills | Status: DC

## 2019-03-23 NOTE — Progress Notes (Signed)
Patient ID: Elijah Walker, male   DOB: [birth year], 68 y.o.   MRN: [id number]   Subjective:    Patient ID: Elijah Walker, male    DOB: 03-Dec-1950, 68 y.o.   MRN: [id number]  HPI  Patient here for his physical exam.  States he is doing well.  Working.  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  Increased sweating.  No chest pain.  No sob.  No acid reflux.  No abdominal pain.  Bowels moving.  Overall he feels good.  States he feels better than he has in a long time.  adderall is working.  Discussed recent lab results.  Discussed trial of another statin.  Had intolerance to crestor and pravastatin.  Agreeable to try lipitor.  Discussed labs and a1c 6.0.  Discussed diet and exercise.     Past Medical History:  Diagnosis Date  . Blood in stool    H/O  . History of chicken pox   . Hyperlipidemia    Past Surgical History:  Procedure Laterality Date  . CHOLECYSTECTOMY  1998  . COLONOSCOPY    . SHOULDER ARTHROSCOPY WITH BANKART REPAIR Left 09/14/2017   Procedure: SHOULDER ARTHROSCOPY WITH MINI OPEN ROTATOR CUFF REPAIR, SUBACROMINAL DECOMPRESSION, LABRAL REPAIR,BICEP TENOTOMY, DISTAL CLAVICLE EXCISION ;  Surgeon: Thornton Park, MD;  Location: [facility];  Service: Orthopedics;  Laterality: Left;   Family History  Problem Relation Age of Onset  . Dementia Mother   . Diabetes Father   . Colitis Sister   . Autoimmune disease Brother   . Colitis Daughter    Social History   Socioeconomic History  . Marital status: Married    Spouse name: Not on file  . Number of children: Not on file  . Years of education: Not on file  . Highest education level: Not on file  Occupational History  . Not on file  Social Needs  . Financial resource strain: Not hard at all  . Food insecurity    Worry: Never true    Inability: Never true  . Transportation needs    Medical: No    Non-medical: No  Tobacco Use  . Smoking status: Never Smoker  . Smokeless tobacco: Never Used  Substance and Sexual Activity   . Alcohol use: No    Alcohol/week: 0.0 standard drinks    Comment: occ  . Drug use: No  . Sexual activity: Not on file  Lifestyle  . Physical activity    Days per week: 4 days    Minutes per session: 150+ min  . Stress: Not at all  Relationships  . Social Herbalist on phone: Not on file    Gets together: Not on file    Attends religious service: Not on file    Active member of club or organization: Not on file    Attends meetings of clubs or organizations: Not on file    Relationship status: Married  Other Topics Concern  . Not on file  Social History Narrative  . Not on file    Outpatient Encounter Medications as of 03/23/2019  Medication Sig  . amphetamine-dextroamphetamine (ADDERALL XR) 20 MG 24 hr capsule Take 1 capsule (20 mg total) by mouth every morning.  . Omega-3 Fatty Acids (FISH OIL PO) Take 4,400 mg by mouth daily.  . [DISCONTINUED] amphetamine-dextroamphetamine (ADDERALL XR) 20 MG 24 hr capsule Take 1 capsule (20 mg total) by mouth every morning.  Marland Kitchen atorvastatin (LIPITOR) 10 MG tablet Take one tablet q  Monday, Wednesday and Friday.   No facility-administered encounter medications on file as of 03/23/2019.     Review of Systems  Constitutional: Negative for appetite change and unexpected weight change.  HENT: Negative for congestion and sinus pressure.   Eyes: Negative for pain and visual disturbance.  Respiratory: Negative for cough, chest tightness and shortness of breath.   Cardiovascular: Negative for chest pain, palpitations and leg swelling.  Gastrointestinal: Negative for abdominal pain, diarrhea, nausea and vomiting.  Genitourinary: Negative for difficulty urinating and dysuria.  Musculoskeletal: Negative for joint swelling and myalgias.  Skin: Negative for color change and rash.  Neurological: Negative for dizziness, light-headedness and headaches.  Hematological: Negative for adenopathy. Does not bruise/bleed easily.   Psychiatric/Behavioral: Negative for agitation and dysphoric mood.       Objective:    Physical Exam Constitutional:      General: He is not in acute distress.    Appearance: Normal appearance. He is well-developed.  HENT:     Head: Normocephalic and atraumatic.     Right Ear: External ear normal.     Left Ear: External ear normal.  Eyes:     General: No scleral icterus.       Right eye: No discharge.        Left eye: No discharge.     Conjunctiva/sclera: Conjunctivae normal.  Neck:     Musculoskeletal: Neck supple. No muscular tenderness.     Thyroid: No thyromegaly.  Cardiovascular:     Rate and Rhythm: Normal rate and regular rhythm.  Pulmonary:     Effort: No respiratory distress.     Breath sounds: Normal breath sounds. No wheezing.  Abdominal:     General: Bowel sounds are normal.     Palpations: Abdomen is soft.     Tenderness: There is no abdominal tenderness.  Genitourinary:    Comments: Not performed.  Musculoskeletal:        General: No swelling or tenderness.  Lymphadenopathy:     Cervical: No cervical adenopathy.  Skin:    Findings: No erythema or rash.  Neurological:     Mental Status: He is alert and oriented to person, place, and time.  Psychiatric:        Mood and Affect: Mood normal.        Behavior: Behavior normal.     BP 126/70   Pulse 76   Temp (!) 97.4 F (36.3 C)   Resp 16   Wt 159 lb 3.2 oz (72.2 kg)   SpO2 98%   BMI 23.68 kg/m  Wt Readings from Last 3 Encounters:  03/23/19 159 lb 3.2 oz (72.2 kg)  09/15/18 160 lb 6.4 oz (72.8 kg)  06/16/18 163 lb (73.9 kg)     Lab Results  Component Value Date   WBC 5.0 09/13/2018   HGB 15.7 09/13/2018   HCT 47.7 09/13/2018   PLT 246.0 09/13/2018   GLUCOSE 105 (H) 03/21/2019   CHOL 276 (H) 03/21/2019   TRIG 109.0 03/21/2019   HDL 44.20 03/21/2019   LDLCALC 210 (H) 03/21/2019   ALT 29 03/21/2019   AST 18 03/21/2019   NA 141 03/21/2019   K 4.8 03/21/2019   CL 104 03/21/2019    CREATININE 1.15 03/21/2019   BUN 14 03/21/2019   CO2 30 03/21/2019   TSH 2.22 03/21/2019   PSA 1.48 09/13/2018   INR 1.0 08/30/2017   HGBA1C 6.0 03/21/2019   MICROALBUR <0.7 07/20/2016    Dg Pelvis 1-2 Views  Addendum  Date: 03/01/2018   ADDENDUM REPORT: 03/01/2018 07:12 ADDENDUM: The top of the left greater trochanter is 7.4 mm higher than the top of the right greater trochanter. Electronically Signed   By: Dorise Bullion III M.D   On: 03/01/2018 07:12   Result Date: 03/01/2018 CLINICAL DATA:  Leg-length discrepancy. EXAM: PELVIS - 1-2 VIEW COMPARISON:  None. FINDINGS: The left greater trochanter may be slightly higher than the right, less than 10 mm difference. No significant degenerative changes identified. No loss of joint space. The iliac bones, sacrum, lower lumbar spine, and SI joints are unremarkable. No other acute abnormalities. IMPRESSION: The left greater trochanter appears slightly higher than the right. No other significant abnormalities. Electronically Signed: By: Dorise Bullion III M.D On: 02/14/2018 16:56       Assessment & Plan:   Problem List Items Addressed This Visit    ADHD    Diagnosed by Dr Glennon Hamilton.  On adderall.  Doing well on current medication.  Feels better.  More focused.  Follow.        Anemia    Follow cbc.       Health care maintenance    Physical today 03/23/19.  Colonoscopy 10/2013.  PSA 09/13/18 - 1.48.        Hypercholesterolemia    Discussed recent cholesterol results.  Discussed starting cholesterol medication.  Had intolerance to crestor and pravastatin previously.  Trial of lipitor - three days per week.  Follow lipid panel and liver function tests.        Relevant Medications   atorvastatin (LIPITOR) 10 MG tablet   Other Relevant Orders   Hepatic function panel   Hyperglycemia    Low carb diet and exercise.  Follow met b and a1c.           [name], MD

## 2019-03-26 ENCOUNTER — Encounter: Payer: Self-pay | Admitting: Internal Medicine

## 2019-03-26 NOTE — Assessment & Plan Note (Signed)
Low carb diet and exercise.  Follow met b and a1c.  

## 2019-03-26 NOTE — Assessment & Plan Note (Signed)
Follow cbc.  

## 2019-03-26 NOTE — Assessment & Plan Note (Signed)
Diagnosed by Dr Glennon Hamilton.  On adderall.  Doing well on current medication.  Feels better.  More focused.  Follow.

## 2019-03-26 NOTE — Assessment & Plan Note (Signed)
Discussed recent cholesterol results.  Discussed starting cholesterol medication.  Had intolerance to crestor and pravastatin previously.  Trial of lipitor - three days per week.  Follow lipid panel and liver function tests.

## 2019-03-26 NOTE — Assessment & Plan Note (Signed)
Physical today 03/23/19.  Colonoscopy 10/2013.  PSA 09/13/18 - 1.48.

## 2019-04-12 ENCOUNTER — Encounter: Payer: Self-pay | Admitting: Internal Medicine

## 2019-04-13 ENCOUNTER — Other Ambulatory Visit: Payer: Self-pay

## 2019-04-14 ENCOUNTER — Other Ambulatory Visit: Payer: Self-pay

## 2019-04-14 MED ORDER — ATORVASTATIN CALCIUM 10 MG PO TABS: ORAL_TABLET | ORAL | 2 refills | Status: DC

## 2019-04-15 MED ORDER — AMPHETAMINE-DEXTROAMPHET ER 20 MG PO CP24: 20.0000 mg | ORAL_CAPSULE | ORAL | 0 refills | Status: DC

## 2019-04-15 MED ORDER — ATORVASTATIN CALCIUM 10 MG PO TABS: ORAL_TABLET | ORAL | 2 refills | Status: DC

## 2019-04-15 NOTE — Telephone Encounter (Signed)
rx sent in for adderall and lipitor.

## 2019-05-04 ENCOUNTER — Other Ambulatory Visit (INDEPENDENT_AMBULATORY_CARE_PROVIDER_SITE_OTHER): Payer: PPO

## 2019-05-04 ENCOUNTER — Other Ambulatory Visit: Payer: Self-pay

## 2019-05-04 DIAGNOSIS — E7800 Pure hypercholesterolemia, unspecified: Secondary | ICD-10-CM | POA: Diagnosis not present

## 2019-05-04 LAB — HEPATIC FUNCTION PANEL
ALT: 25 U/L (ref 0–53)
AST: 20 U/L (ref 0–37)
Albumin: 4.3 g/dL (ref 3.5–5.2)
Alkaline Phosphatase: 66 U/L (ref 39–117)
Bilirubin, Direct: 0.2 mg/dL (ref 0.0–0.3)
Total Bilirubin: 1.2 mg/dL (ref 0.2–1.2)
Total Protein: 6.4 g/dL (ref 6.0–8.3)

## 2019-05-05 ENCOUNTER — Encounter: Payer: Self-pay | Admitting: Internal Medicine

## 2019-05-19 ENCOUNTER — Ambulatory Visit: Payer: PPO | Admitting: Internal Medicine

## 2019-05-19 ENCOUNTER — Other Ambulatory Visit: Payer: Self-pay | Admitting: Internal Medicine

## 2019-05-19 ENCOUNTER — Ambulatory Visit: Payer: PPO

## 2019-05-19 NOTE — Telephone Encounter (Signed)
Requested medication (s) are due for refill today: yes  Requested medication (s) are on the active medication list: yes  Last refill: 04/12/2019  Future visit scheduled: yes  Notes to clinic:  Refill cannot be delegated  Review for refill and Qty   Requested Prescriptions  Pending Prescriptions Disp Refills   amphetamine-dextroamphetamine (ADDERALL XR) 20 MG 24 hr capsule 30 capsule 0    Sig: Take 1 capsule (20 mg total) by mouth every morning.     Not Delegated - Psychiatry:  Stimulants/ADHD Failed - 05/19/2019  9:49 AM      Failed - This refill cannot be delegated      Failed - Urine Drug Screen completed in last 360 days.      Passed - Valid encounter within last 3 months    Recent Outpatient Visits          1 month ago Routine general medical examination at a health care facility   [place], MD   2 months ago Attention deficit hyperactivity disorder (ADHD), unspecified ADHD type   [place] Scott, Randell Patient, MD   5 months ago Attention deficit hyperactivity disorder (ADHD), unspecified ADHD type   [facility], Randell Patient, MD   6 months ago Attention deficit hyperactivity disorder (ADHD), unspecified ADHD type   [facility]   8 months ago Skin lesion   [place], MD      Future Appointments            In 4 days O'Brien-Blaney, Bryson Corona, LPN [place], [place]   In 1 month [name], MD [facility]            atorvastatin (LIPITOR) 10 MG tablet 13 tablet 2    Sig: Take one tablet q Monday, Wednesday and Friday.     Cardiovascular:  Antilipid - Statins Failed - 05/19/2019  9:49 AM      Failed - Total Cholesterol in normal range and within 360 days    Cholesterol  Date Value Ref Range Status  03/21/2019 276 (H) 0 - 200 mg/dL Final    Comment:    ATP III  Classification       Desirable:  < 200 mg/dL               Borderline High:  200 - 239 mg/dL          High:  > = 240 mg/dL         Failed - LDL in normal range and within 360 days    LDL Cholesterol  Date Value Ref Range Status  03/21/2019 210 (H) 0 - 99 mg/dL Final         Passed - HDL in normal range and within 360 days    HDL  Date Value Ref Range Status  03/21/2019 44.20 >39.00 mg/dL Final         Passed - Triglycerides in normal range and within 360 days    Triglycerides  Date Value Ref Range Status  03/21/2019 109.0 0.0 - 149.0 mg/dL Final    Comment:    Normal:  <150 mg/dLBorderline High:  150 - 199 mg/dL         Passed - Patient is not pregnant      Passed - Valid encounter within last 12 months    Recent Outpatient Visits  1 month ago Routine general medical examination at a health care facility   [place], MD   2 months ago Attention deficit hyperactivity disorder (ADHD), unspecified ADHD type   [place] [facility], Randell Patient, MD   5 months ago Attention deficit hyperactivity disorder (ADHD), unspecified ADHD type   [facility], MD   6 months ago Attention deficit hyperactivity disorder (ADHD), unspecified ADHD type   [facility], Randell Patient, MD   8 months ago Skin lesion   [place], MD      Future Appointments            In 4 days O'Brien-Blaney, Bryson Corona, LPN [place], [place]   In 1 month [name], MD [place], [place]

## 2019-05-19 NOTE — Telephone Encounter (Signed)
Medication: amphetamine-dextroamphetamine (ADDERALL XR) 20 MG 24 hr capsule [id number], atorvastatin (LIPITOR) 10 MG tablet [id number]   Has the patient contacted their pharmacy? Yes  (Agent: If no, request that the patient contact the pharmacy for the refill.) (Agent: If yes, when and what did the pharmacy advise?)  Preferred Pharmacy (with phone number or street name): [id number] - [place], [place] [phone] (Phone) [phone] (Fax)    Agent: Please be advised that RX refills may take up to 3 business days. We ask that you follow-up with your pharmacy.

## 2019-05-23 ENCOUNTER — Ambulatory Visit (INDEPENDENT_AMBULATORY_CARE_PROVIDER_SITE_OTHER): Payer: PPO

## 2019-05-23 ENCOUNTER — Other Ambulatory Visit: Payer: Self-pay

## 2019-05-23 DIAGNOSIS — Z0000 Encounter for general adult medical examination without abnormal findings: Secondary | ICD-10-CM | POA: Diagnosis not present

## 2019-05-23 NOTE — Patient Instructions (Addendum)
  [name] , Thank you for taking time to come for your Medicare Wellness Visit. I appreciate your ongoing commitment to your health goals. Please review the following plan we discussed and let me know if I can assist you in the future.   These are the goals we discussed: Goals      Patient Stated   . Low cholesterol diet (pt-stated)       This is a list of the screening recommended for you and due dates:  Health Maintenance  Topic Date Due  . Tetanus Vaccine  07/04/2018  . Colon Cancer Screening  11/01/2023  . Flu Shot  Completed  .  Hepatitis C: One time screening is recommended by Center for Disease Control  (CDC) for  adults born from [birth year] through 1965.   Completed  . Pneumonia vaccines  Completed

## 2019-05-23 NOTE — Telephone Encounter (Signed)
Pt called and is requesting to have an update on this refill. Please advise.

## 2019-05-23 NOTE — Progress Notes (Addendum)
Subjective:   Elijah Walker is a 68 y.o. male who presents for Medicare Annual/Subsequent preventive examination.  Review of Systems:  No ROS.  Medicare Wellness Virtual Visit.  Visual/audio telehealth visit, UTA vital signs.   See social history for additional risk factors.   Cardiac Risk Factors include: advanced age (>61men, >86 women);male gender     Objective:    Vitals: There were no vitals taken for this visit.  There is no height or weight on file to calculate BMI.  Advanced Directives 05/23/2019 05/17/2018 09/14/2017 09/07/2017  Does Patient Have a Medical Advance Directive? No No No No  Would patient like information on creating a medical advance directive? No - Patient declined No - Patient declined No - Patient declined No - Patient declined    Tobacco Social History   Tobacco Use  Smoking Status Never Smoker  Smokeless Tobacco Never Used     Counseling given: Not Answered   Clinical Intake:  Pre-visit preparation completed: Yes        Diabetes: No  How often do you need to have someone help you when you read instructions, pamphlets, or other written materials from your doctor or pharmacy?: 1 - Never  Interpreter Needed?: No     Past Medical History:  Diagnosis Date  . Blood in stool    H/O  . History of chicken pox   . Hyperlipidemia    Past Surgical History:  Procedure Laterality Date  . CHOLECYSTECTOMY  1998  . COLONOSCOPY    . SHOULDER ARTHROSCOPY WITH BANKART REPAIR Left 09/14/2017   Procedure: SHOULDER ARTHROSCOPY WITH MINI OPEN ROTATOR CUFF REPAIR, SUBACROMINAL DECOMPRESSION, LABRAL REPAIR,BICEP TENOTOMY, DISTAL CLAVICLE EXCISION ;  Surgeon: Thornton Park, MD;  Location: [facility] ORS;  Service: Orthopedics;  Laterality: Left;   Family History  Problem Relation Age of Onset  . Dementia Mother   . Diabetes Father   . Colitis Sister   . Autoimmune disease Brother   . Colitis Daughter    Social History   Socioeconomic History  .  Marital status: Married    Spouse name: Not on file  . Number of children: Not on file  . Years of education: Not on file  . Highest education level: Not on file  Occupational History  . Not on file  Social Needs  . Financial resource strain: Not hard at all  . Food insecurity    Worry: Never true    Inability: Never true  . Transportation needs    Medical: No    Non-medical: No  Tobacco Use  . Smoking status: Never Smoker  . Smokeless tobacco: Never Used  Substance and Sexual Activity  . Alcohol use: No    Alcohol/week: 0.0 standard drinks    Comment: occ  . Drug use: No  . Sexual activity: Not on file  Lifestyle  . Physical activity    Days per week: 4 days    Minutes per session: 150+ min  . Stress: Not at all  Relationships  . Social Herbalist on phone: Not on file    Gets together: Not on file    Attends religious service: Not on file    Active member of club or organization: Not on file    Attends meetings of clubs or organizations: Not on file    Relationship status: Married  Other Topics Concern  . Not on file  Social History Narrative  . Not on file    Outpatient Encounter Medications as  of 05/23/2019  Medication Sig  . amphetamine-dextroamphetamine (ADDERALL XR) 20 MG 24 hr capsule Take 1 capsule (20 mg total) by mouth every morning.  Marland Kitchen atorvastatin (LIPITOR) 10 MG tablet Take one tablet q Monday, Wednesday and Friday.  . Omega-3 Fatty Acids (FISH OIL PO) Take 4,400 mg by mouth daily.   No facility-administered encounter medications on file as of 05/23/2019.     Activities of Daily Living In your present state of health, do you have any difficulty performing the following activities: 05/23/2019  Hearing? N  Vision? N  Difficulty concentrating or making decisions? N  Walking or climbing stairs? N  Dressing or bathing? N  Doing errands, shopping? N  Preparing Food and eating ? N  Using the Toilet? N  In the past six months, have you  accidently leaked urine? N  Do you have problems with loss of bowel control? N  Managing your Medications? N  Managing your Finances? N  Housekeeping or managing your Housekeeping? N  Some recent data might be hidden    Patient Care Team: [name], MD as PCP - General (Internal Medicine)   Assessment:   This is a routine wellness examination for Terin.  Nurse connected with patient 05/23/19 at  8:30 AM EST by a telephone enabled telemedicine application and verified that I am speaking with the correct person using two identifiers. Patient stated full name and DOB. Patient gave permission to continue with virtual visit. Patient's location was at home and Nurse's location was at [place] office.   Health Maintenance Due: -Tdap- discussed; to be completed with doctor in visit or local pharmacy.   -Cholesterol- 03/21/19 (276) -Hgb A1c- 03/21/19 (6.0) Update all pending maintenance due as appropriate.   See completed HM at the end of note.   Eye: Visual acuity not assessed. Virtual visit. Wears corrective lenses. Followed by their ophthalmologist.   Dental: UTD   Hearing: Demonstrates normal hearing during visit.  Safety:  Patient feels safe at home- yes Patient does have smoke detectors at home- yes Patient does wear sunscreen or protective clothing when in direct sunlight - yes Patient does wear seat belt when in a moving vehicle - yes Patient drives- yes Adequate lighting in walkways free from debris- yes Grab bars and handrails used as appropriate- yes Ambulates with no assistive device Cell phone r lifeline/life alert/medic alert on person when ambulating outside of the home- yes  Social: Alcohol intake - no     Smoking history- never   Smokers in home? none Illicit drug use? none  Depression: PHQ 2 &9 complete. See screening below. Denies irritability, anhedonia, sadness/tearfullness.    Falls: See screening below.    Medication: Taking as directed and  without issues.   Covid-19: Precautions and sickness symptoms discussed. Wears mask, social distancing, hand hygiene as appropriate.   Activities of Daily Living Patient denies needing assistance with: household chores, feeding themselves, getting from bed to chair, getting to the toilet, bathing/showering, dressing, managing money, or preparing meals.   Memory: Patient is alert. Patient denies difficulty focusing or concentrating. Correctly identified the president of the [place], season and recall.  BMI- discussed the importance of a healthy diet, water intake and the benefits of aerobic exercise.  Educational material provided.  Physical activity- works outside the home.  Diet:  High protein, low carb Water: good intake  Other Providers Patient Care Team: [name], MD as PCP - General (Internal Medicine)  Exercise Activities and Dietary recommendations    Goals  Patient Stated   . Low cholesterol diet (pt-stated)       Fall Risk Fall Risk  05/23/2019 05/17/2018 02/25/2017 01/06/2016 08/14/2014  Falls in the past year? 0 0 No No No  Follow up Falls prevention discussed - - - -   Timed Get Up and Go Performed: no, virtual visit  Depression Screen PHQ 2/9 Scores 05/23/2019 03/23/2019 05/17/2018 02/25/2017  PHQ - 2 Score 0 0 0 0  PHQ- 9 Score - 0 - -    Cognitive Function MMSE - Mini Mental State Exam 05/17/2018  Orientation to time 5  Orientation to Place 5  Registration 3  Attention/ Calculation 5  Recall 3  Language- name 2 objects 2  Language- repeat 1  Language- follow 3 step command 3  Language- read & follow direction 1  Write a sentence 1  Copy design 1  Total score 30     6CIT Screen 05/23/2019  What Year? 0 points  What month? 0 points  What time? 0 points  Count back from 20 0 points  Months in reverse 0 points  Repeat phrase 0 points  Total Score 0    Immunization History  Administered Date(s) Administered  . Fluad Quad(high Dose  65+) 03/21/2019  . Influenza Whole 06/22/2016  . Influenza,inj,Quad PF,6+ Mos 05/08/2014, 05/22/2015  . Influenza-Unspecified 04/05/2013, 03/18/2017, 04/26/2018  . PPD Test 08/29/2014  . Pneumococcal Conjugate-13 09/01/2016  . Pneumococcal Polysaccharide-23 03/24/2018  . Tdap 07/04/2008  . Zoster 01/23/2011   Screening Tests Health Maintenance  Topic Date Due  . TETANUS/TDAP  07/04/2018  . COLONOSCOPY  11/01/2023  . INFLUENZA VACCINE  Completed  . Hepatitis C Screening  Completed  . PNA vac Low Risk Adult  Completed      Plan:   Keep all routine maintenance appointments.   Follow up 06/29/19 @ 930. Discuss muscle weakness with taking atorvastatin.  Medicare Attestation I have personally reviewed: The patient's medical and social history Their use of alcohol, tobacco or illicit drugs Their current medications and supplements The patient's functional ability including ADLs,fall risks, home safety risks, cognitive, and hearing and visual impairment Diet and physical activities Evidence for depression   In addition, I have reviewed and discussed with patient certain preventive protocols, quality metrics, and best practice recommendations. A written personalized care plan for preventive services as well as general preventive health recommendations were provided to patient via mail.     Varney Biles, LPN  [phone]   Reviewed above information.  Agree with assessment and plan.    Dr Nicki Reaper

## 2019-05-26 ENCOUNTER — Telehealth: Payer: Self-pay | Admitting: Internal Medicine

## 2019-05-26 MED ORDER — AMPHETAMINE-DEXTROAMPHET ER 20 MG PO CP24: 20.0000 mg | ORAL_CAPSULE | ORAL | 0 refills | Status: DC

## 2019-05-26 MED ORDER — ATORVASTATIN CALCIUM 10 MG PO TABS: ORAL_TABLET | ORAL | 2 refills | Status: DC

## 2019-05-26 NOTE — Telephone Encounter (Signed)
My chart message sent to pt regarding adderall refill.

## 2019-06-05 IMAGING — MR MR SHOULDER*L* W/ CM
5 of 6 series · 33 of 40 positions shown · IV contrast (agent unspecified)
Comparison: Radiographs dated 02/25/2017 and 08/23/2017

CLINICAL DATA: Left shoulder pain with painful range of motion.

EXAM:
MR ARTHROGRAM OF THE LEFT SHOULDER
TECHNIQUE: Multiplanar, multisequence MR imaging of the left shoulder was
performed following the administration of intra-articular contrast.
CONTRAST:  See injection procedure report

[Series 3: T1 fat-sat · axial · 4.0mm · 0.47mm/px · z∈[-36,+69]mm · 8 of 25 slices shown (1 of 2)]
[im 1/25]
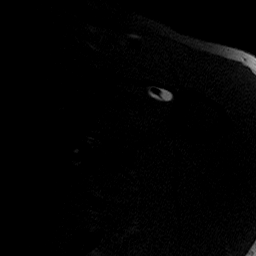
[im 4/25]
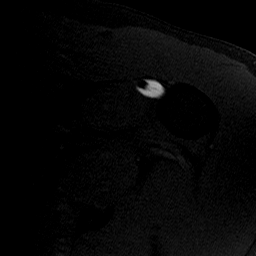
[im 7/25]
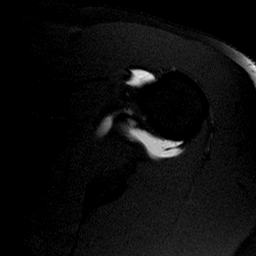
[im 11/25]
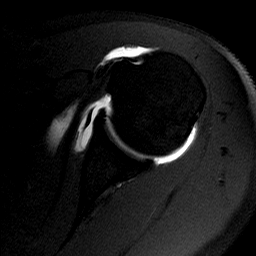
[im 14/25]
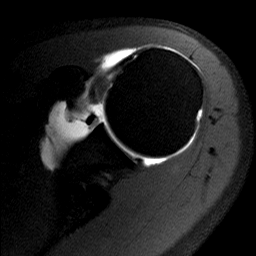
[im 18/25]
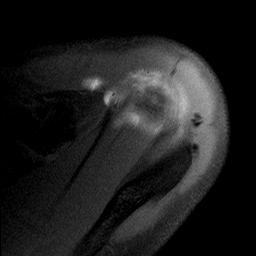
[im 21/25]
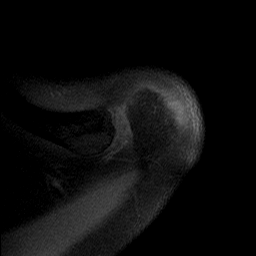
[im 25/25]
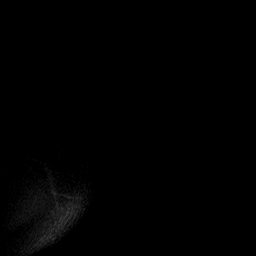

[Series 5: T2 fat-sat · oblique · 4.0mm · 0.62mm/px · 6 of 21 slices shown (1 of 2)]
[im 1/21]
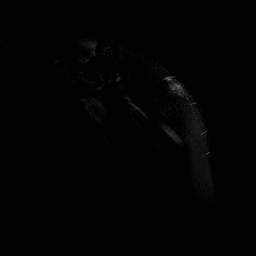
[im 5/21]
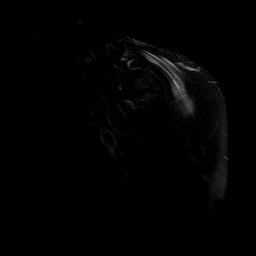
[im 9/21]
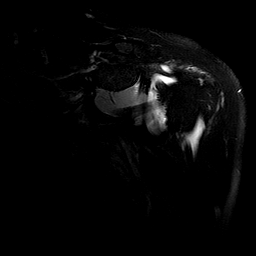
[im 13/21]
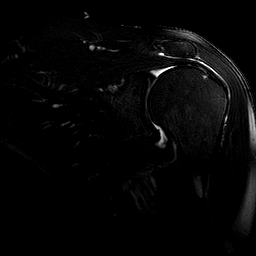
[im 17/21]
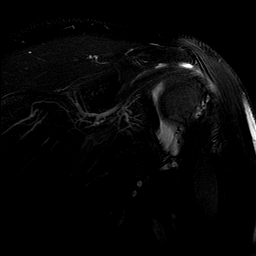
[im 21/21]
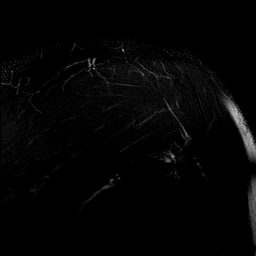

[Series 6: T1 · oblique · 4.0mm · 0.31mm/px · 6 of 21 slices shown]
[im 1/21]
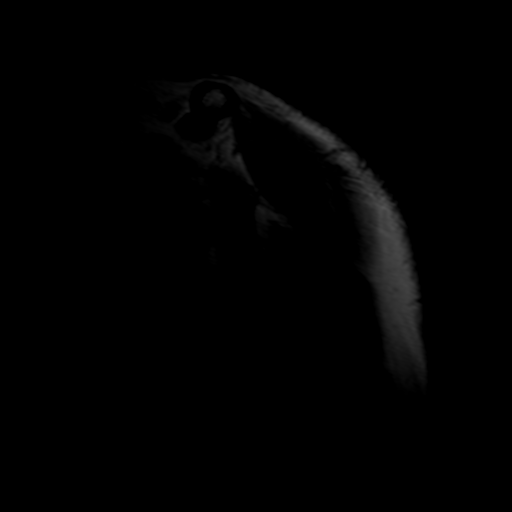
[im 5/21]
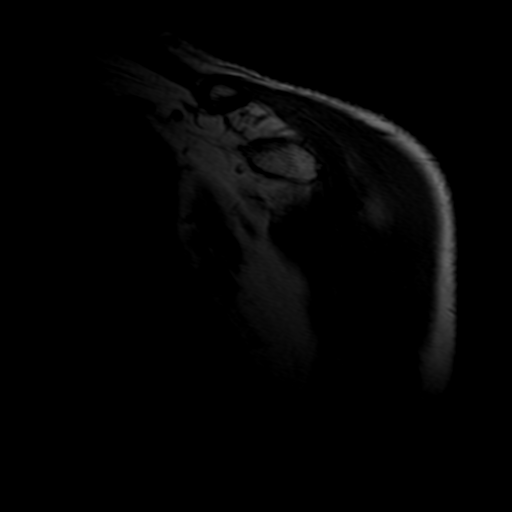
[im 9/21]
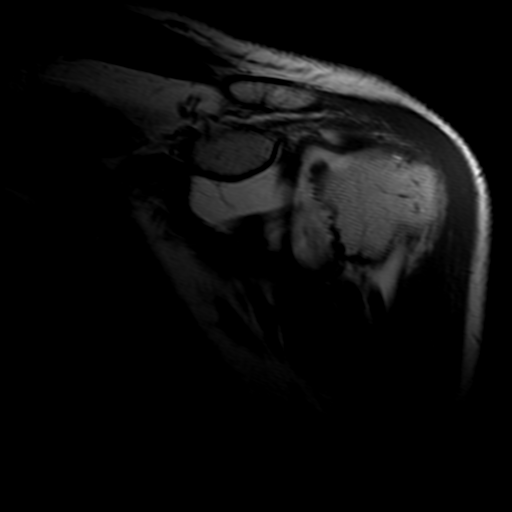
[im 13/21]
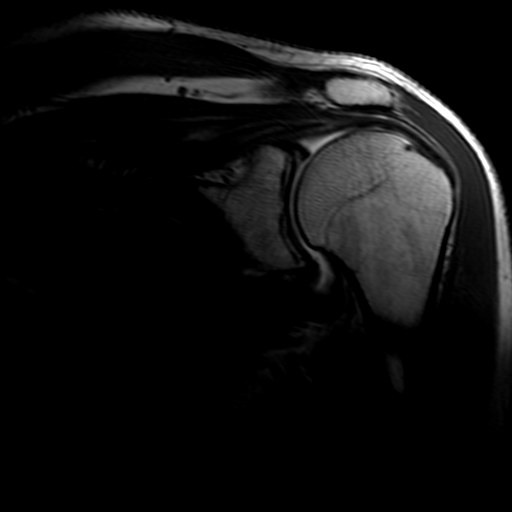
[im 17/21]
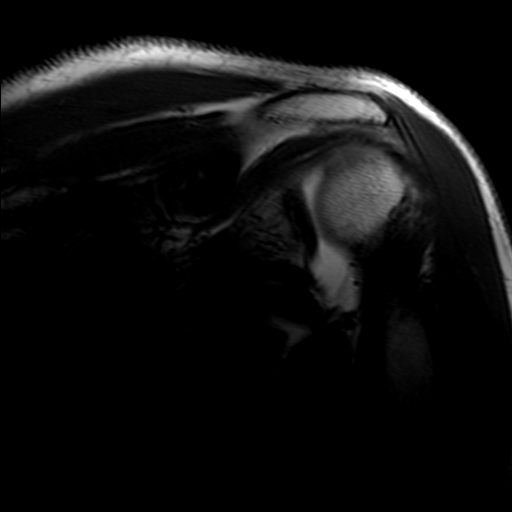
[im 21/21]
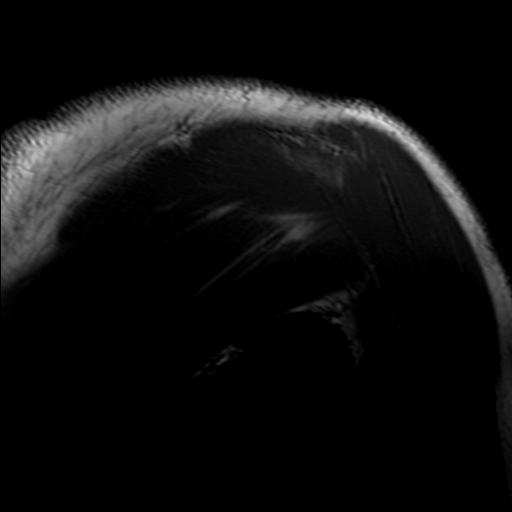

[Series 7: T2 fat-sat · oblique · 4.0mm · 0.62mm/px · 7 of 25 slices shown (2 of 2)]
[im 1/25]
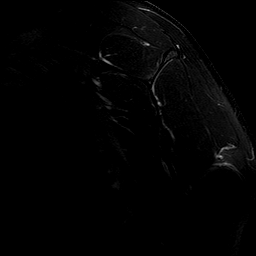
[im 5/25]
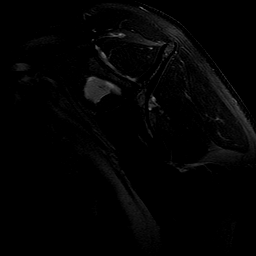
[im 9/25]
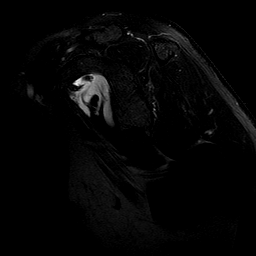
[im 13/25]
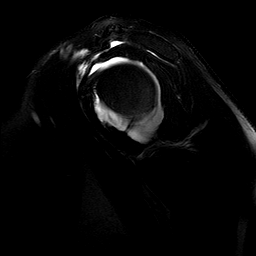
[im 17/25]
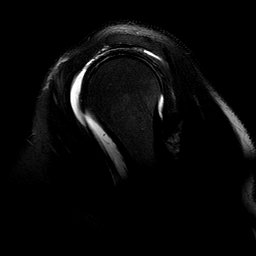
[im 21/25]
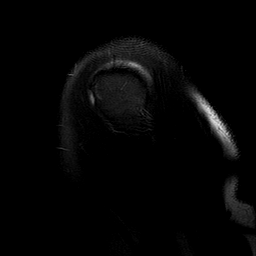
[im 25/25]
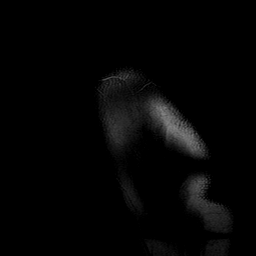

[Series 10: T1 fat-sat · sagittal · 4.0mm · 0.35mm/px · 6 of 23 slices shown (2 of 2)]
[im 1/23]
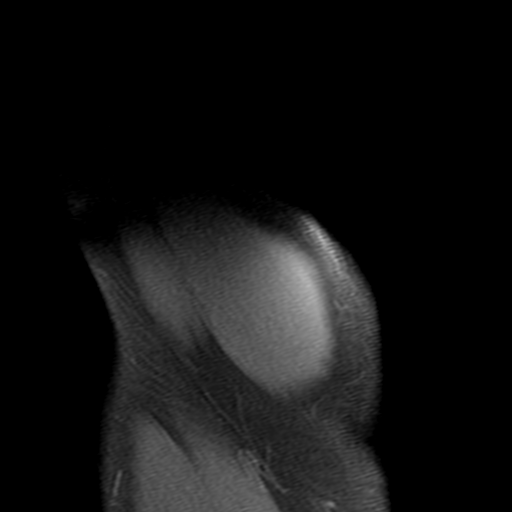
[im 4/23]
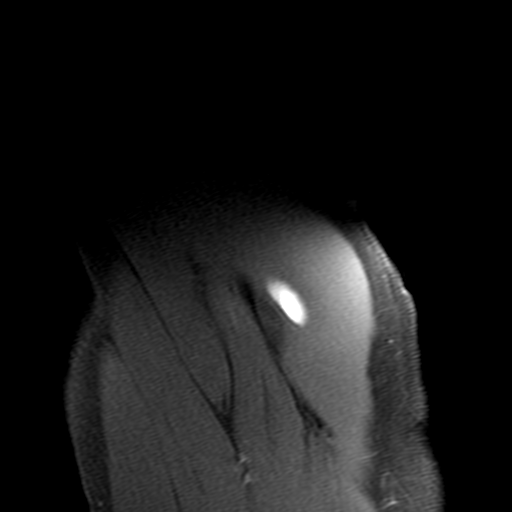
[im 8/23]
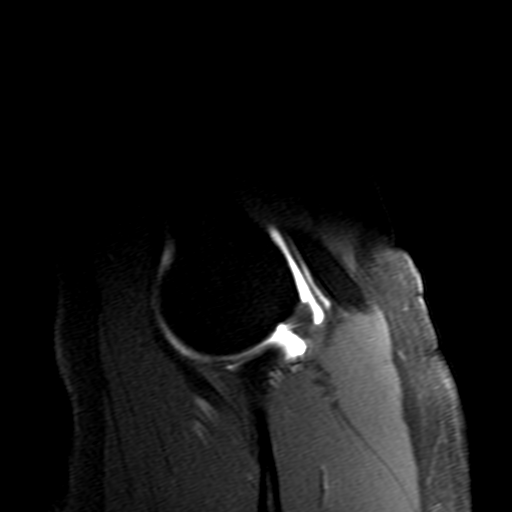
[im 12/23]
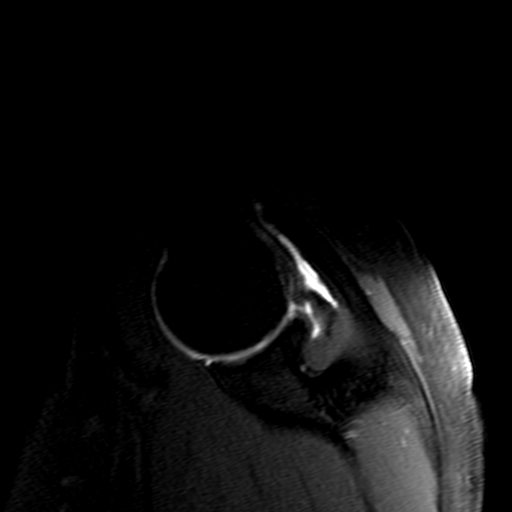
[im 15/23]
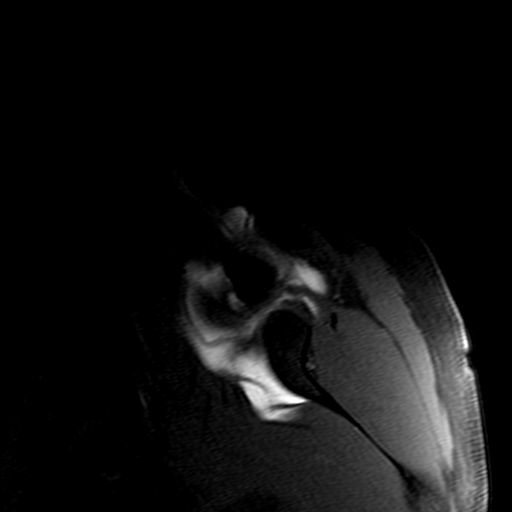
[im 19/23]
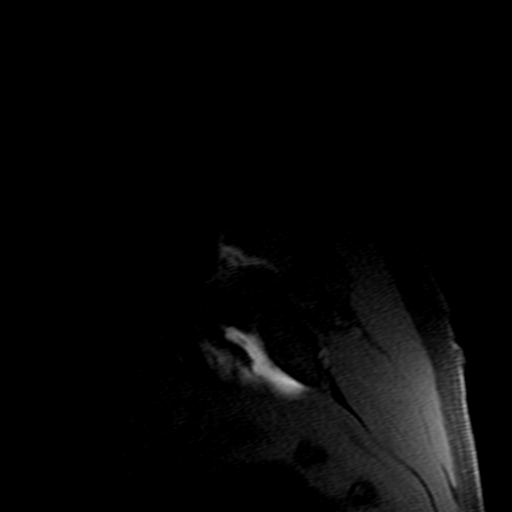

[33 of 40 positions shown; findings below may reference images not displayed]

FINDINGS: Rotator cuff: The long head of the biceps tendon is dislocated into
the subscapularis tendon consistent with a least a partial thickness
bursal surface tear of the subscapularis. Although no discrete
full-thickness tear of the subscapularis is apparent, the literature
suggests a high incidence of complete subscapularis tears even on
negative MR arthrography. In this case, there is contrast in the
subacromial/subdeltoid bursae consistent with a full-thickness tear
of the rotator cuff.

There is marked thinning of the anterior aspect of the distal
supraspinatus tendon without a definitive tear. The infraspinatus
and teres minor tendons are intact.

Muscles: No atrophy or edema.

Biceps long head: The biceps tendon is dislocated into the substance
of the subscapularis tendon.

Acromioclavicular Joint: Normal.  Type 2 acromion.

Glenohumeral Joint: No chondral defect.

Labrum: There is a SLAP tear extending posteriorly from 11 o'clock
to at least 6 o'clock.

Bones: Normal.
IMPRESSION: 1. Extensive SLAP tear extending from 11 o'clock to 6 o'clock.
2. Dislocation of the long head of the biceps tendon into the
subscapularis tendon consistent with a partial-thickness bursal
surface tear of the subscapularis. There is a high incidence of
occult full-thickness tears of the subscapularis with this finding,
even with MR arthrography.
3. Marked thinning of the anterior aspect of the distal
supraspinatus tendon without a definitive tear.
4. There contrast seen in the subacromial/subdeltoid bursae highly
suggestive of an occult full-thickness rotator cuff tear.

## 2019-06-07 ENCOUNTER — Other Ambulatory Visit: Payer: Self-pay

## 2019-06-20 ENCOUNTER — Encounter: Payer: Self-pay | Admitting: Internal Medicine

## 2019-06-21 MED ORDER — AMPHETAMINE-DEXTROAMPHET ER 20 MG PO CP24: 20.0000 mg | ORAL_CAPSULE | ORAL | 0 refills | Status: DC

## 2019-06-21 NOTE — Telephone Encounter (Signed)
rx sent in for adderall

## 2019-06-21 NOTE — Telephone Encounter (Signed)
Needing refill on Adderall. Last filled 05/26/19 #30 no refills

## 2019-06-26 ENCOUNTER — Telehealth: Payer: Self-pay | Admitting: Internal Medicine

## 2019-06-26 DIAGNOSIS — R739 Hyperglycemia, unspecified: Secondary | ICD-10-CM

## 2019-06-26 DIAGNOSIS — E7800 Pure hypercholesterolemia, unspecified: Secondary | ICD-10-CM

## 2019-06-26 NOTE — Telephone Encounter (Signed)
Called pt to set up Doxy appt and he wanted to have labs done before his 12/17 appt

## 2019-06-27 NOTE — Telephone Encounter (Signed)
Future lab orders placed

## 2019-06-27 NOTE — Telephone Encounter (Signed)
Scheduled will place orders

## 2019-06-27 NOTE — Telephone Encounter (Signed)
No orders found. Please place future lab orders. Pt has 8am lab appt tomorrow.

## 2019-06-27 NOTE — Telephone Encounter (Signed)
Orders signed for a1c, met b and lipid panel and liver function tests.

## 2019-06-28 ENCOUNTER — Other Ambulatory Visit (INDEPENDENT_AMBULATORY_CARE_PROVIDER_SITE_OTHER): Payer: PPO

## 2019-06-28 ENCOUNTER — Other Ambulatory Visit: Payer: Self-pay

## 2019-06-28 DIAGNOSIS — R739 Hyperglycemia, unspecified: Secondary | ICD-10-CM | POA: Diagnosis not present

## 2019-06-28 DIAGNOSIS — E7800 Pure hypercholesterolemia, unspecified: Secondary | ICD-10-CM

## 2019-06-28 LAB — BASIC METABOLIC PANEL WITH GFR
BUN: 17 mg/dL (ref 6–23)
CO2: 31 meq/L (ref 19–32)
Calcium: 9.2 mg/dL (ref 8.4–10.5)
Chloride: 104 meq/L (ref 96–112)
Creatinine, Ser: 1.12 mg/dL (ref 0.40–1.50)
GFR: 65.2 mL/min
Glucose, Bld: 109 mg/dL — ABNORMAL HIGH (ref 70–99)
Potassium: 4.7 meq/L (ref 3.5–5.1)
Sodium: 139 meq/L (ref 135–145)

## 2019-06-28 LAB — CBC WITH DIFFERENTIAL/PLATELET
Basophils Absolute: 0 10*3/uL (ref 0.0–0.1)
Basophils Relative: 0.6 % (ref 0.0–3.0)
Eosinophils Absolute: 0.2 10*3/uL (ref 0.0–0.7)
Eosinophils Relative: 3.5 % (ref 0.0–5.0)
HCT: 44.3 % (ref 39.0–52.0)
Hemoglobin: 14.9 g/dL (ref 13.0–17.0)
Lymphocytes Relative: 37.6 % (ref 12.0–46.0)
Lymphs Abs: 1.6 10*3/uL (ref 0.7–4.0)
MCHC: 33.7 g/dL (ref 30.0–36.0)
MCV: 96.1 fl (ref 78.0–100.0)
Monocytes Absolute: 0.5 10*3/uL (ref 0.1–1.0)
Monocytes Relative: 10.6 % (ref 3.0–12.0)
Neutro Abs: 2.1 10*3/uL (ref 1.4–7.7)
Neutrophils Relative %: 47.7 % (ref 43.0–77.0)
Platelets: 226 10*3/uL (ref 150.0–400.0)
RBC: 4.61 Mil/uL (ref 4.22–5.81)
RDW: 13.3 % (ref 11.5–15.5)
WBC: 4.4 10*3/uL (ref 4.0–10.5)

## 2019-06-28 LAB — LIPID PANEL
Cholesterol: 184 mg/dL (ref 0–200)
HDL: 46.8 mg/dL (ref >39.00)
LDL Cholesterol: 121 mg/dL — ABNORMAL HIGH (ref 0–99)
NonHDL: 137.26
Total CHOL/HDL Ratio: 4
Triglycerides: 80 mg/dL (ref 0.0–149.0)
VLDL: 16 mg/dL (ref 0.0–40.0)

## 2019-06-28 LAB — TSH: TSH: 2.53 u[IU]/mL (ref 0.35–4.50)

## 2019-06-28 LAB — HEPATIC FUNCTION PANEL
ALT: 26 U/L (ref 0–53)
AST: 21 U/L (ref 0–37)
Albumin: 4.1 g/dL (ref 3.5–5.2)
Alkaline Phosphatase: 68 U/L (ref 39–117)
Bilirubin, Direct: 0.2 mg/dL (ref 0.0–0.3)
Total Bilirubin: 1.1 mg/dL (ref 0.2–1.2)
Total Protein: 6.4 g/dL (ref 6.0–8.3)

## 2019-06-28 LAB — HEMOGLOBIN A1C: Hgb A1c MFr Bld: 5.8 % (ref 4.6–6.5)

## 2019-06-29 ENCOUNTER — Ambulatory Visit (INDEPENDENT_AMBULATORY_CARE_PROVIDER_SITE_OTHER): Payer: PPO | Admitting: Internal Medicine

## 2019-06-29 ENCOUNTER — Encounter: Payer: Self-pay | Admitting: Internal Medicine

## 2019-06-29 DIAGNOSIS — R739 Hyperglycemia, unspecified: Secondary | ICD-10-CM

## 2019-06-29 DIAGNOSIS — R0989 Other specified symptoms and signs involving the circulatory and respiratory systems: Secondary | ICD-10-CM

## 2019-06-29 DIAGNOSIS — M25512 Pain in left shoulder: Secondary | ICD-10-CM | POA: Diagnosis not present

## 2019-06-29 DIAGNOSIS — R6889 Other general symptoms and signs: Secondary | ICD-10-CM | POA: Diagnosis not present

## 2019-06-29 DIAGNOSIS — E7800 Pure hypercholesterolemia, unspecified: Secondary | ICD-10-CM

## 2019-06-29 DIAGNOSIS — D649 Anemia, unspecified: Secondary | ICD-10-CM

## 2019-06-29 DIAGNOSIS — F909 Attention-deficit hyperactivity disorder, unspecified type: Secondary | ICD-10-CM | POA: Diagnosis not present

## 2019-06-29 NOTE — Progress Notes (Signed)
Patient ID: Elijah Walker, male   DOB: 06-Dec-1950, 68 y.o.   MRN: [id number]   Virtual Visit via video Note  This visit type was conducted due to national recommendations for restrictions regarding the COVID-19 pandemic (e.g. social distancing).  This format is felt to be most appropriate for this patient at this time.  All issues noted in this document were discussed and addressed.  No physical exam was performed (except for noted visual exam findings with Video Visits).   I connected with [name] by a video enabled telemedicine application and verified that I am speaking with the correct person using two identifiers. Location patient: home Location provider: work  Persons participating in the virtual visit: patient, provider  I discussed the limitations, risks, security and privacy concerns of performing an evaluation and management service by video and the availability of in person appointments.  The patient expressed understanding and agreed to proceed.   Reason for visit: scheduled follow up.   HPI: He reports he is doing relatively well.  States has noticed previously if he drinks a coke - will need to clear his throat.  States that after getting labs drawn yesterday, drank a pepsi and noticed started having to clear his throat.  Later that day, he cleaned his shed.  Increased dust.  Noticed after - pain in his nose, headache, some cough - to clear his throat.  No productive cough.  Slept.  Feels better today.  No headache, no cough, no chest congestion.  No fever. 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  No chest pain or sob with increased activity or exertion.  Does have a plantar wart - right foot.  Wants to follow.  Skin lesion on right neck - wants to follow.  Discussed labs.  Cholesterol significantly improved.  Tolerating lipitor.  May have noticed some minimal pain in left shoulder when holds his hand over his head for a long period of time.  Also his grasp may be minimally weaker.   Discussed with him regarding remaining on lipitor and monitoring, he is agreeable.  Eating well.  No nausea, vomiting or diarrhea reported.     ROS: See pertinent positives and negatives per HPI.  Past Medical History:  Diagnosis Date  . Blood in stool    H/O  . History of chicken pox   . Hyperlipidemia     Past Surgical History:  Procedure Laterality Date  . CHOLECYSTECTOMY  1998  . COLONOSCOPY    . SHOULDER ARTHROSCOPY WITH BANKART REPAIR Left 09/14/2017   Procedure: SHOULDER ARTHROSCOPY WITH MINI OPEN ROTATOR CUFF REPAIR, SUBACROMINAL DECOMPRESSION, LABRAL REPAIR,BICEP TENOTOMY, DISTAL CLAVICLE EXCISION ;  Surgeon: Thornton Park, MD;  Location: [facility] OR[facility];  Service: Orthopedics;  Laterality: Left;    Family History  Problem Relation Age of Onset  . Dementia Mother   . Diabetes Father   . Colitis Sister   . Autoimmune disease Brother   . Colitis Daughter     SOCIAL HX: reviewed.    Current Outpatient Medications:  .  amphetamine-dextroamphetamine (ADDERALL XR) 20 MG 24 hr capsule, Take 1 capsule (20 mg total) by mouth every morning., Disp: 30 capsule, Rfl: 0 .  atorvastatin (LIPITOR) 10 MG tablet, Take one tablet q Monday, Wednesday and Friday., Disp: 13 tablet, Rfl: 2 .  Omega-3 Fatty Acids (FISH OIL PO), Take 4,400 mg by mouth daily., Disp: , Rfl:   EXAM:  VITALS per patient if applicable: averages 481-856/31-49  GENERAL: alert, oriented, appears well and  in no acute distress  HEENT: atraumatic, conjunttiva clear, no obvious abnormalities on inspection of external nose and ears  NECK: normal movements of the head and neck  LUNGS: on inspection no signs of respiratory distress, breathing rate appears normal, no obvious gross SOB, gasping or wheezing  CV: no obvious cyanosis  PSYCH/NEURO: pleasant and cooperative, no obvious depression or anxiety, speech and thought processing grossly intact  ASSESSMENT AND PLAN:  Discussed the following assessment and  plan:  ADHD Continue adderall.  Feels he is doing better.  Follow.    Anemia Follow cbc.   Hypercholesterolemia On lipitor.  Cholesterol improved.  Appears to be tolerating.  Will remain on current lipitor dose.  Follow lipid panel and liver function tests.    Hyperglycemia Low carb diet and exercise.  Follow met b and a1c.   Left shoulder pain S/p surgery.  Overall doing well.  Follow.    Throat clearing Noticed after drinking a soft drink and cleaning the shed.  Appears to be c/w allergies.  Better today.  Follow.      I discussed the assessment and treatment plan with the patient. The patient was provided an opportunity to ask questions and all were answered. The patient agreed with the plan and demonstrated an understanding of the instructions.   The patient was advised to call back or seek an in-person evaluation if the symptoms worsen or if the condition fails to improve as anticipated.   [name], MD

## 2019-07-02 ENCOUNTER — Encounter: Payer: Self-pay | Admitting: Internal Medicine

## 2019-07-02 DIAGNOSIS — R6889 Other general symptoms and signs: Secondary | ICD-10-CM | POA: Insufficient documentation

## 2019-07-02 DIAGNOSIS — R0989 Other specified symptoms and signs involving the circulatory and respiratory systems: Secondary | ICD-10-CM | POA: Insufficient documentation

## 2019-07-02 NOTE — Assessment & Plan Note (Signed)
S/p surgery.  Overall doing well.  Follow.

## 2019-07-02 NOTE — Assessment & Plan Note (Signed)
Continue adderall.  Feels he is doing better.  Follow.

## 2019-07-02 NOTE — Assessment & Plan Note (Signed)
On lipitor.  Cholesterol improved.  Appears to be tolerating.  Will remain on current lipitor dose.  Follow lipid panel and liver function tests.

## 2019-07-02 NOTE — Assessment & Plan Note (Signed)
Noticed after drinking a soft drink and cleaning the shed.  Appears to be c/w allergies.  Better today.  Follow.

## 2019-07-02 NOTE — Assessment & Plan Note (Signed)
Low carb diet and exercise.  Follow met b and a1c.  

## 2019-07-02 NOTE — Assessment & Plan Note (Signed)
Follow cbc.  

## 2019-07-14 ENCOUNTER — Encounter: Payer: Self-pay | Admitting: Internal Medicine

## 2019-07-31 ENCOUNTER — Encounter: Payer: Self-pay | Admitting: Internal Medicine

## 2019-07-31 NOTE — Telephone Encounter (Signed)
I will update him on the rest of this message but wanted to know if you are ok with him decreasing his adderall dose back down and he does need refills for 3 months.

## 2019-08-01 ENCOUNTER — Encounter: Payer: Self-pay | Admitting: Internal Medicine

## 2019-08-01 MED ORDER — AMPHETAMINE-DEXTROAMPHET ER 20 MG PO CP24: 20.0000 mg | ORAL_CAPSULE | ORAL | 0 refills | Status: DC

## 2019-08-01 MED ORDER — AMPHETAMINE-DEXTROAMPHET ER 10 MG PO CP24: 10.0000 mg | ORAL_CAPSULE | Freq: Every day | ORAL | 0 refills | Status: DC

## 2019-08-01 NOTE — Telephone Encounter (Signed)
[facility] call [facility] and clarify.  I sent in 3 prescriptions.  One for now and one for mid February and one for mid March.

## 2019-08-01 NOTE — Telephone Encounter (Signed)
rx sent in for adderall 10mg  x 3 months.

## 2019-08-01 NOTE — Telephone Encounter (Signed)
See other note

## 2019-08-09 ENCOUNTER — Other Ambulatory Visit: Payer: Self-pay | Admitting: Internal Medicine

## 2019-08-11 ENCOUNTER — Other Ambulatory Visit: Payer: Self-pay | Admitting: Lab

## 2019-08-11 ENCOUNTER — Telehealth: Payer: Self-pay | Admitting: Lab

## 2019-08-11 ENCOUNTER — Telehealth: Payer: Self-pay | Admitting: Internal Medicine

## 2019-08-11 NOTE — Telephone Encounter (Signed)
Pt states that the wrong medication was called in and needs some clarification. Please advise

## 2019-08-11 NOTE — Telephone Encounter (Signed)
Called Pt back Pt stated him and Dr Nicki Reaper discussed him stopping the Lipitor. Pt stated [facility] called [facility] stating his Lipitor was ready. Pt wanted the Lipitor removed of his Med list so that [facility] would stop calling. I removed the Lipitor of his list.

## 2019-08-11 NOTE — Telephone Encounter (Signed)
Please notify pt that you have taken care of this.  Tell him sorry for the inconvenience.

## 2019-08-11 NOTE — Telephone Encounter (Signed)
Pt called back and notified Rx list updated.

## 2019-08-27 ENCOUNTER — Ambulatory Visit: Payer: PPO | Attending: Internal Medicine

## 2019-08-27 DIAGNOSIS — Z23 Encounter for immunization: Secondary | ICD-10-CM | POA: Insufficient documentation

## 2019-08-27 NOTE — Progress Notes (Signed)
   Covid-19 Vaccination Clinic  Name:  Elijah Walker    MRN: [id number] DOB: 01/17/1951  08/27/2019  Mr. Raz was observed post Covid-19 immunization for 15 minutes without incidence. He was provided with Vaccine Information Sheet and instruction to access the V-Safe system.   Mr. Carpenito was instructed to call 911 with any severe reactions post vaccine: Marland Kitchen Difficulty breathing  . Swelling of your face and throat  . A fast heartbeat  . A bad rash all over your body  . Dizziness and weakness    Immunizations Administered    Name Date Dose VIS Date Route   Pfizer COVID-19 Vaccine 08/27/2019  8:25 AM 0.3 mL 06/23/2019 Intramuscular   Manufacturer: [place]   Lot: [id number]   [place]: [id number]

## 2019-09-04 ENCOUNTER — Encounter: Payer: Self-pay | Admitting: Internal Medicine

## 2019-09-06 ENCOUNTER — Telehealth: Payer: Self-pay

## 2019-09-06 DIAGNOSIS — E7800 Pure hypercholesterolemia, unspecified: Secondary | ICD-10-CM

## 2019-09-06 NOTE — Telephone Encounter (Signed)
Please schedule pt for fasting labs prior to appt. Needs to be after 09/26/2019

## 2019-09-06 NOTE — Telephone Encounter (Signed)
Labs ordered. Sent to [facility] for scheduling

## 2019-09-06 NOTE — Telephone Encounter (Signed)
scheduled

## 2019-09-20 ENCOUNTER — Ambulatory Visit: Payer: PPO | Attending: Internal Medicine

## 2019-09-20 DIAGNOSIS — Z23 Encounter for immunization: Secondary | ICD-10-CM

## 2019-09-20 NOTE — Progress Notes (Signed)
   Covid-19 Vaccination Clinic  Name:  Elijah Walker    MRN: [id number] DOB: Jul 19, 1950  09/20/2019  Elijah Walker was observed post Covid-19 immunization for 15 minutes without incident. He was provided with Vaccine Information Sheet and instruction to access the V-Safe system.   Elijah Walker was instructed to call 911 with any severe reactions post vaccine: Marland Kitchen Difficulty breathing  . Swelling of face and throat  . A fast heartbeat  . A bad rash all over body  . Dizziness and weakness   Immunizations Administered    Name Date Dose VIS Date Route   Pfizer COVID-19 Vaccine 09/20/2019 12:27 PM 0.3 mL 06/23/2019 Intramuscular   Manufacturer: [place]   Lot: [id number]   [place]: [id number]

## 2019-09-29 ENCOUNTER — Other Ambulatory Visit: Payer: Self-pay

## 2019-09-29 ENCOUNTER — Other Ambulatory Visit (INDEPENDENT_AMBULATORY_CARE_PROVIDER_SITE_OTHER): Payer: PPO

## 2019-09-29 DIAGNOSIS — E7800 Pure hypercholesterolemia, unspecified: Secondary | ICD-10-CM | POA: Diagnosis not present

## 2019-09-29 LAB — BASIC METABOLIC PANEL
BUN: 17 mg/dL (ref 6–23)
CO2: 30 mEq/L (ref 19–32)
Calcium: 9.1 mg/dL (ref 8.4–10.5)
Chloride: 105 mEq/L (ref 96–112)
Creatinine, Ser: 1.27 mg/dL (ref 0.40–1.50)
GFR: 56.36 mL/min — ABNORMAL LOW (ref >60.00)
Glucose, Bld: 104 mg/dL — ABNORMAL HIGH (ref 70–99)
Potassium: 4.1 mEq/L (ref 3.5–5.1)
Sodium: 139 mEq/L (ref 135–145)

## 2019-09-29 LAB — HEPATIC FUNCTION PANEL
ALT: 23 U/L (ref 0–53)
AST: 19 U/L (ref 0–37)
Albumin: 4 g/dL (ref 3.5–5.2)
Alkaline Phosphatase: 71 U/L (ref 39–117)
Bilirubin, Direct: 0.1 mg/dL (ref 0.0–0.3)
Total Bilirubin: 0.7 mg/dL (ref 0.2–1.2)
Total Protein: 6.6 g/dL (ref 6.0–8.3)

## 2019-09-29 LAB — LIPID PANEL
Cholesterol: 247 mg/dL — ABNORMAL HIGH (ref 0–200)
HDL: 41.8 mg/dL (ref >39.00)
LDL Cholesterol: 186 mg/dL — ABNORMAL HIGH (ref 0–99)
NonHDL: 205.02
Total CHOL/HDL Ratio: 6
Triglycerides: 94 mg/dL (ref 0.0–149.0)
VLDL: 18.8 mg/dL (ref 0.0–40.0)

## 2019-09-29 LAB — HEMOGLOBIN A1C: Hgb A1c MFr Bld: 5.9 % (ref 4.6–6.5)

## 2019-10-03 ENCOUNTER — Other Ambulatory Visit: Payer: Self-pay

## 2019-10-03 ENCOUNTER — Ambulatory Visit (INDEPENDENT_AMBULATORY_CARE_PROVIDER_SITE_OTHER): Payer: PPO | Admitting: Internal Medicine

## 2019-10-03 ENCOUNTER — Encounter: Payer: Self-pay | Admitting: Internal Medicine

## 2019-10-03 DIAGNOSIS — R739 Hyperglycemia, unspecified: Secondary | ICD-10-CM

## 2019-10-03 DIAGNOSIS — F909 Attention-deficit hyperactivity disorder, unspecified type: Secondary | ICD-10-CM | POA: Diagnosis not present

## 2019-10-03 DIAGNOSIS — L989 Disorder of the skin and subcutaneous tissue, unspecified: Secondary | ICD-10-CM

## 2019-10-03 DIAGNOSIS — M25511 Pain in right shoulder: Secondary | ICD-10-CM | POA: Diagnosis not present

## 2019-10-03 DIAGNOSIS — R030 Elevated blood-pressure reading, without diagnosis of hypertension: Secondary | ICD-10-CM | POA: Diagnosis not present

## 2019-10-03 DIAGNOSIS — E7800 Pure hypercholesterolemia, unspecified: Secondary | ICD-10-CM | POA: Diagnosis not present

## 2019-10-03 DIAGNOSIS — R944 Abnormal results of kidney function studies: Secondary | ICD-10-CM | POA: Insufficient documentation

## 2019-10-03 DIAGNOSIS — M25512 Pain in left shoulder: Secondary | ICD-10-CM | POA: Diagnosis not present

## 2019-10-03 DIAGNOSIS — M25519 Pain in unspecified shoulder: Secondary | ICD-10-CM | POA: Insufficient documentation

## 2019-10-03 DIAGNOSIS — B070 Plantar wart: Secondary | ICD-10-CM | POA: Diagnosis not present

## 2019-10-03 LAB — SEDIMENTATION RATE: Sed Rate: 3 mm/hr (ref 0–20)

## 2019-10-03 LAB — URINALYSIS, ROUTINE W REFLEX MICROSCOPIC
Bilirubin Urine: NEGATIVE
Hgb urine dipstick: NEGATIVE
Ketones, ur: NEGATIVE
Leukocytes,Ua: NEGATIVE
Nitrite: NEGATIVE
RBC / HPF: NONE SEEN (ref 0–?)
Specific Gravity, Urine: 1.015 (ref 1.000–1.030)
Total Protein, Urine: NEGATIVE
Urine Glucose: NEGATIVE
Urobilinogen, UA: 0.2 (ref 0.0–1.0)
WBC, UA: NONE SEEN (ref 0–?)
pH: 7 (ref 5.0–8.0)

## 2019-10-03 LAB — CK: Total CK: 107 U/L (ref 7–232)

## 2019-10-03 LAB — BASIC METABOLIC PANEL
BUN: 18 mg/dL (ref 6–23)
CO2: 30 mEq/L (ref 19–32)
Calcium: 9.2 mg/dL (ref 8.4–10.5)
Chloride: 103 mEq/L (ref 96–112)
Creatinine, Ser: 1.12 mg/dL (ref 0.40–1.50)
GFR: 65.15 mL/min (ref >60.00)
Glucose, Bld: 79 mg/dL (ref 70–99)
Potassium: 3.8 mEq/L (ref 3.5–5.1)
Sodium: 138 mEq/L (ref 135–145)

## 2019-10-03 NOTE — Progress Notes (Signed)
Patient ID: Elijah Walker, male   DOB: 1951-06-25, 69 y.o.   MRN: [id number]   Subjective:    Patient ID: Elijah Walker, male    DOB: [birth year], 69 y.o.   MRN: [id number]  HPI This visit occurred during the SARS-CoV-2 public health emergency.  Safety protocols were in place, including screening questions prior to the visit, additional usage of staff PPE, and extensive cleaning of exam room while observing appropriate contact time as indicated for disinfecting solutions.  Patient here for a scheduled follow up. Reports persistent bilateral upper arm and shoulder pain.  Has been a persistent problem for him.  States started 2-3 months after starting the statin medication.  States had same symptoms with zetia.  Reports since stopping statin medication, pain/symptoms have persisted.  Stays active.  Does a lot of physical activity.  No chest pain or sob.  No acid reflux, abdominal pain or bowel change reported.  Has plantar warts.  Increased pain.  Discussed labs.  Discussed diet and exercise.     Past Medical History:  Diagnosis Date  . Blood in stool    H/O  . History of chicken pox   . Hyperlipidemia    Past Surgical History:  Procedure Laterality Date  . CHOLECYSTECTOMY  1998  . COLONOSCOPY    . SHOULDER ARTHROSCOPY WITH BANKART REPAIR Left 09/14/2017   Procedure: SHOULDER ARTHROSCOPY WITH MINI OPEN ROTATOR CUFF REPAIR, SUBACROMINAL DECOMPRESSION, LABRAL REPAIR,BICEP TENOTOMY, DISTAL CLAVICLE EXCISION ;  Surgeon: Thornton Park, MD;  Location: [facility] ORS;  Service: Orthopedics;  Laterality: Left;   Family History  Problem Relation Age of Onset  . Dementia Mother   . Diabetes Father   . Colitis Sister   . Autoimmune disease Brother   . Colitis Daughter    Social History   Socioeconomic History  . Marital status: Married    Spouse name: Not on file  . Number of children: Not on file  . Years of education: Not on file  . Highest education level: Not on file  Occupational History    . Not on file  Tobacco Use  . Smoking status: Never Smoker  . Smokeless tobacco: Never Used  Substance and Sexual Activity  . Alcohol use: No    Alcohol/week: 0.0 standard drinks    Comment: occ  . Drug use: No  . Sexual activity: Not on file  Other Topics Concern  . Not on file  Social History Narrative  . Not on file   Social Determinants of Health   Financial Resource Strain:   . Difficulty of Paying Living Expenses:   Food Insecurity:   . Worried About Charity fundraiser in the Last Year:   . Arboriculturist in the Last Year:   Transportation Needs:   . Film/video editor (Medical):   Marland Kitchen Lack of Transportation (Non-Medical):   Physical Activity:   . Days of Exercise per Week:   . Minutes of Exercise per Session:   Stress:   . Feeling of Stress :   Social Connections:   . Frequency of Communication with Friends and Family:   . Frequency of Social Gatherings with Friends and Family:   . Attends Religious Services:   . Active Member of Clubs or Organizations:   . Attends Archivist Meetings:   Marland Kitchen Marital Status:     Outpatient Encounter Medications as of 10/03/2019  Medication Sig  . Omega-3 Fatty Acids (FISH OIL PO) Take 4,400 mg by mouth daily.  . [  DISCONTINUED] amphetamine-dextroamphetamine (ADDERALL XR) 10 MG 24 hr capsule Take 1 capsule (10 mg total) by mouth daily.   No facility-administered encounter medications on file as of 10/03/2019.    Review of Systems  Constitutional: Negative for appetite change and unexpected weight change.  HENT: Negative for congestion and sinus pressure.   Respiratory: Negative for cough, chest tightness and shortness of breath.   Cardiovascular: Negative for chest pain, palpitations and leg swelling.  Gastrointestinal: Negative for abdominal pain, diarrhea, nausea and vomiting.  Genitourinary: Negative for difficulty urinating and dysuria.  Musculoskeletal: Negative for joint swelling.       Increased shoulder  and upper arm pain -persistent.   Skin: Negative for color change and rash.  Neurological: Negative for dizziness, light-headedness and headaches.  Psychiatric/Behavioral: Negative for agitation and dysphoric mood.       Objective:    Physical Exam Constitutional:      General: He is not in acute distress.    Appearance: Normal appearance. He is well-developed.  HENT:     Head: Normocephalic and atraumatic.     Right Ear: External ear normal.     Left Ear: External ear normal.  Eyes:     General: No scleral icterus.       Right eye: No discharge.        Left eye: No discharge.     Conjunctiva/sclera: Conjunctivae normal.  Cardiovascular:     Rate and Rhythm: Normal rate and regular rhythm.  Pulmonary:     Effort: Pulmonary effort is normal. No respiratory distress.     Breath sounds: Normal breath sounds.  Abdominal:     General: Bowel sounds are normal.     Palpations: Abdomen is soft.     Tenderness: There is no abdominal tenderness.  Musculoskeletal:        General: No swelling or tenderness.     Cervical back: Neck supple. No rigidity or tenderness.  Skin:    Findings: No erythema or rash.  Neurological:     Mental Status: He is alert.  Psychiatric:        Mood and Affect: Mood normal.        Behavior: Behavior normal.     BP 122/68   Pulse 63   Temp (!) 97.3 F (36.3 C)   Resp 16   Ht '5\' 8"'  (1.727 m)   Wt 167 lb (75.8 kg)   SpO2 98%   BMI 25.39 kg/m  Wt Readings from Last 3 Encounters:  10/03/19 167 lb (75.8 kg)  06/29/19 157 lb (71.2 kg)  03/23/19 159 lb 3.2 oz (72.2 kg)     Lab Results  Component Value Date   WBC 4.4 06/28/2019   HGB 14.9 06/28/2019   HCT 44.3 06/28/2019   PLT 226.0 06/28/2019   GLUCOSE 79 10/03/2019   CHOL 247 (H) 09/29/2019   TRIG 94.0 09/29/2019   HDL 41.80 09/29/2019   LDLCALC 186 (H) 09/29/2019   ALT 23 09/29/2019   AST 19 09/29/2019   NA 138 10/03/2019   K 3.8 10/03/2019   CL 103 10/03/2019   CREATININE 1.12  10/03/2019   BUN 18 10/03/2019   CO2 30 10/03/2019   TSH 2.53 06/28/2019   PSA 1.48 09/13/2018   INR 1.0 08/30/2017   HGBA1C 5.9 09/29/2019   MICROALBUR <0.7 07/20/2016    DG Pelvis 1-2 Views  Addendum Date: 03/01/2018   ADDENDUM REPORT: 03/01/2018 07:12 ADDENDUM: The top of the left greater trochanter is 7.4 mm higher  than the top of the right greater trochanter. Electronically Signed   By: Dorise Bullion III M.D   On: 03/01/2018 07:12   Result Date: 03/01/2018 CLINICAL DATA:  Leg-length discrepancy. EXAM: PELVIS - 1-2 VIEW COMPARISON:  None. FINDINGS: The left greater trochanter may be slightly higher than the right, less than 10 mm difference. No significant degenerative changes identified. No loss of joint space. The iliac bones, sacrum, lower lumbar spine, and SI joints are unremarkable. No other acute abnormalities. IMPRESSION: The left greater trochanter appears slightly higher than the right. No other significant abnormalities. Electronically Signed: By: Dorise Bullion III M.D On: 02/14/2018 16:56       Assessment & Plan:   Problem List Items Addressed This Visit    ADHD    Off adderall.  Wants to remain off medication.  Follow.        Decreased GFR    Stay hydrated.  Avoid antiinflammatories.  Follow metabolic panel.        Relevant Orders   Basic metabolic panel (Completed)   Urinalysis, Routine w reflex microscopic (Completed)   Elevated blood pressure reading    Have him spot check his pressure.  Send in readings.  Follow pressures.  Follow metabolic panel.        Hypercholesterolemia    On lipitor.  Low cholesterol diet and exercise.  Follow lipid panel and liver function tests.        Hyperglycemia    Low carb diet and exercise.  Follow met b and a1c.       Plantar warts    Plantar warts and bunions.  Increased pain.  Refer to podiatry given persistent problems.        Relevant Orders   Ambulatory referral to Podiatry   Shoulder pain    Bilateral  shoulder/arm pain as outlined.  Did not improve with stopping statin medication.  Check esr and ck.        Relevant Orders   Sedimentation rate (Completed)   CK (Creatine Kinase) (Completed)   Skin lesion    Lesions on back and wants skin survey.  Refer to dermatology.        Relevant Orders   Ambulatory referral to Dermatology       [name], MD

## 2019-10-04 ENCOUNTER — Encounter: Payer: Self-pay | Admitting: Internal Medicine

## 2019-10-08 ENCOUNTER — Encounter: Payer: Self-pay | Admitting: Internal Medicine

## 2019-10-08 DIAGNOSIS — B070 Plantar wart: Secondary | ICD-10-CM | POA: Insufficient documentation

## 2019-10-08 NOTE — Assessment & Plan Note (Signed)
Lesions on back and wants skin survey.  Refer to dermatology.

## 2019-10-08 NOTE — Assessment & Plan Note (Signed)
Plantar warts and bunions.  Increased pain.  Refer to podiatry given persistent problems.

## 2019-10-08 NOTE — Assessment & Plan Note (Signed)
Have him spot check his pressure.  Send in readings.  Follow pressures.  Follow metabolic panel.

## 2019-10-08 NOTE — Assessment & Plan Note (Signed)
Bilateral shoulder/arm pain as outlined.  Did not improve with stopping statin medication.  Check esr and ck.

## 2019-10-08 NOTE — Assessment & Plan Note (Signed)
Off adderall.  Wants to remain off medication.  Follow.

## 2019-10-08 NOTE — Assessment & Plan Note (Signed)
Stay hydrated.  Avoid antiinflammatories.  Follow metabolic panel.   

## 2019-10-08 NOTE — Assessment & Plan Note (Signed)
On lipitor.  Low cholesterol diet and exercise.  Follow lipid panel and liver function tests.   

## 2019-10-08 NOTE — Assessment & Plan Note (Signed)
Low carb diet and exercise.  Follow met b and a1c.  

## 2019-10-17 ENCOUNTER — Other Ambulatory Visit: Payer: Self-pay

## 2019-10-17 ENCOUNTER — Ambulatory Visit: Payer: PPO | Admitting: Podiatry

## 2019-10-17 ENCOUNTER — Encounter: Payer: Self-pay | Admitting: Internal Medicine

## 2019-10-17 DIAGNOSIS — B351 Tinea unguium: Secondary | ICD-10-CM

## 2019-10-17 DIAGNOSIS — L989 Disorder of the skin and subcutaneous tissue, unspecified: Secondary | ICD-10-CM | POA: Diagnosis not present

## 2019-10-17 MED ORDER — TERBINAFINE HCL 250 MG PO TABS: 250.0000 mg | ORAL_TABLET | Freq: Every day | ORAL | 0 refills | Status: DC

## 2019-10-18 NOTE — Telephone Encounter (Signed)
Yes.  He will need liver panel check q month while on the medication.  Did podiatry prescribe?  Are they following liver or do we need to order?  If we are following, will need liver panel checked in 4 weeks.

## 2019-10-19 NOTE — Progress Notes (Signed)
   Subjective: 69 y.o. male presenting to the office today as a new patient with a chief complaint of sharp pain to the plantar aspects of the bilateral feet secondary to callus lesions that have been present for the past 2 years. He is concerned for plantar warts. Walking increases the pain. He has not had any treatment for the symptoms. Patient is here for further evaluation and treatment.   Past Medical History:  Diagnosis Date  . Blood in stool    H/O  . History of chicken pox   . Hyperlipidemia      Objective:  Physical Exam General: Alert and oriented x3 in no acute distress  Dermatology: Hyperkeratotic lesion(s) present on the bilateral feet. Pain on palpation with a central nucleated core noted. Hyperkeratotic, discolored, thickened, onychodystrophy noted to the left 2nd toenail. Skin is warm, dry and supple bilateral lower extremities. Negative for open lesions or macerations.  Vascular: Palpable pedal pulses bilaterally. No edema or erythema noted. Capillary refill within normal limits.  Neurological: Epicritic and protective threshold grossly intact bilaterally.   Musculoskeletal Exam: Pain on palpation at the keratotic lesion(s) noted. Range of motion within normal limits bilateral. Muscle strength 5/5 in all groups bilateral.  Assessment: 1. 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 Onychomycosis noted to the left 2nd toe    Plan of Care:  1. Patient evaluated 2. Excisional debridement of keratoic lesion(s) using a chisel blade was performed without incident.  3. Dressed area with light dressing. 4. Prescription for Lamisil 250 mg #90 provided to patient.  5. Inquired about liver function and patient declines any known history of issue or problems.  6. Patient is to return to the clinic PRN.   Edrick Kins, DPM [facility]  Dr. Edrick Kins, [address]                                        [place], [place] [address]                 Office [phone]  Fax [phone]

## 2019-11-10 ENCOUNTER — Encounter: Payer: Self-pay | Admitting: Internal Medicine

## 2019-11-10 DIAGNOSIS — B351 Tinea unguium: Secondary | ICD-10-CM

## 2019-11-10 NOTE — Telephone Encounter (Signed)
See my chart message.  I have placed the order for the lab.  Please contact pt and schedule a non fasting lab appt.

## 2019-11-13 ENCOUNTER — Other Ambulatory Visit: Payer: Self-pay

## 2019-11-13 ENCOUNTER — Other Ambulatory Visit (INDEPENDENT_AMBULATORY_CARE_PROVIDER_SITE_OTHER): Payer: PPO

## 2019-11-13 ENCOUNTER — Encounter: Payer: Self-pay | Admitting: Internal Medicine

## 2019-11-13 DIAGNOSIS — B351 Tinea unguium: Secondary | ICD-10-CM

## 2019-11-13 LAB — HEPATIC FUNCTION PANEL
ALT: 21 U/L (ref 0–53)
AST: 17 U/L (ref 0–37)
Albumin: 4.2 g/dL (ref 3.5–5.2)
Alkaline Phosphatase: 76 U/L (ref 39–117)
Bilirubin, Direct: 0.1 mg/dL (ref 0.0–0.3)
Total Bilirubin: 0.6 mg/dL (ref 0.2–1.2)
Total Protein: 6.3 g/dL (ref 6.0–8.3)

## 2019-11-13 NOTE — Telephone Encounter (Signed)
Pt scheduled  

## 2019-11-14 NOTE — Telephone Encounter (Signed)
My chart message sent to pt.  Elijah Walker,  This pt has not heard regarding his dermatology appt.  It appears information has been sent.  Can you f/u with helping him to get a dermatology appt.    Thank you. Dr Nicki Reaper

## 2019-11-21 NOTE — Telephone Encounter (Signed)
Was September the first date they had available and was pt ok with waiting this long.  Thanks

## 2019-11-28 DIAGNOSIS — H2513 Age-related nuclear cataract, bilateral: Secondary | ICD-10-CM | POA: Diagnosis not present

## 2019-12-06 ENCOUNTER — Encounter: Payer: Self-pay | Admitting: Internal Medicine

## 2019-12-07 ENCOUNTER — Telehealth: Payer: Self-pay | Admitting: Internal Medicine

## 2019-12-07 DIAGNOSIS — Z5181 Encounter for therapeutic drug level monitoring: Secondary | ICD-10-CM

## 2019-12-07 NOTE — Telephone Encounter (Signed)
Elijah Walker needs a non fasting lab appt next week.

## 2019-12-08 NOTE — Telephone Encounter (Signed)
LMTCB and schedule lab

## 2019-12-12 ENCOUNTER — Other Ambulatory Visit (INDEPENDENT_AMBULATORY_CARE_PROVIDER_SITE_OTHER): Payer: PPO

## 2019-12-12 ENCOUNTER — Other Ambulatory Visit: Payer: Self-pay

## 2019-12-12 DIAGNOSIS — Z5181 Encounter for therapeutic drug level monitoring: Secondary | ICD-10-CM

## 2019-12-12 LAB — HEPATIC FUNCTION PANEL
ALT: 18 U/L (ref 0–53)
AST: 16 U/L (ref 0–37)
Albumin: 4.2 g/dL (ref 3.5–5.2)
Alkaline Phosphatase: 71 U/L (ref 39–117)
Bilirubin, Direct: 0.1 mg/dL (ref 0.0–0.3)
Total Bilirubin: 0.6 mg/dL (ref 0.2–1.2)
Total Protein: 6.5 g/dL (ref 6.0–8.3)

## 2019-12-12 NOTE — Telephone Encounter (Signed)
Pt called back and scheduled for 12/12/19 @ 2:45pm.

## 2019-12-13 ENCOUNTER — Encounter: Payer: Self-pay | Admitting: Internal Medicine

## 2020-01-10 ENCOUNTER — Encounter: Payer: Self-pay | Admitting: Internal Medicine

## 2020-01-12 ENCOUNTER — Ambulatory Visit (INDEPENDENT_AMBULATORY_CARE_PROVIDER_SITE_OTHER): Payer: PPO | Admitting: Nurse Practitioner

## 2020-01-12 ENCOUNTER — Other Ambulatory Visit: Payer: Self-pay

## 2020-01-12 ENCOUNTER — Encounter: Payer: Self-pay | Admitting: Nurse Practitioner

## 2020-01-12 VITALS — BP 116/76 | HR 70 | Temp 97.4°F | Ht 67.99 in | Wt 165.2 lb

## 2020-01-12 DIAGNOSIS — N6325 Unspecified lump in the left breast, overlapping quadrants: Secondary | ICD-10-CM

## 2020-01-12 NOTE — Progress Notes (Signed)
Established Patient Office Visit  Subjective:  Patient ID: Elijah Walker, male    DOB: 24-Dec-1950  Age: 69 y.o. MRN: [id number]  CC:  Chief Complaint  Patient presents with  . Mass    Pt c/o bump on left chest.     HPI Elijah Walker is a 69 yo reports presents with a non-tender lump that he found on his left chest wall. It is hard, fixed, with no ulceration of overlying skin, negative history of head and neck cancer, negative tobacco and alcohol, and negative immunocompromised state. No weight loss, hoarseness, dysphagia, DOE.   Past Medical History:  Diagnosis Date  . Blood in stool    H/O  . History of chicken pox   . Hyperlipidemia     Past Surgical History:  Procedure Laterality Date  . CHOLECYSTECTOMY  1998  . COLONOSCOPY    . SHOULDER ARTHROSCOPY WITH BANKART REPAIR Left 09/14/2017   Procedure: SHOULDER ARTHROSCOPY WITH MINI OPEN ROTATOR CUFF REPAIR, SUBACROMINAL DECOMPRESSION, LABRAL REPAIR,BICEP TENOTOMY, DISTAL CLAVICLE EXCISION ;  Surgeon: Thornton Park, MD;  Location: [facility] ORS;  Service: Orthopedics;  Laterality: Left;    Family History  Problem Relation Age of Onset  . Dementia Mother   . Diabetes Father   . Colitis Sister   . Autoimmune disease Brother   . Colitis Daughter     Social History   Socioeconomic History  . Marital status: Married    Spouse name: Not on file  . Number of children: Not on file  . Years of education: Not on file  . Highest education level: Not on file  Occupational History  . Not on file  Tobacco Use  . Smoking status: Never Smoker  . Smokeless tobacco: Never Used  Vaping Use  . Vaping Use: Never used  Substance and Sexual Activity  . Alcohol use: No    Alcohol/week: 0.0 standard drinks    Comment: occ  . Drug use: No  . Sexual activity: Not on file  Other Topics Concern  . Not on file  Social History Narrative  . Not on file   Social Determinants of Health   Financial Resource Strain:   . Difficulty of  Paying Living Expenses:   Food Insecurity:   . Worried About Charity fundraiser in the Last Year:   . Arboriculturist in the Last Year:   Transportation Needs:   . Film/video editor (Medical):   Marland Kitchen Lack of Transportation (Non-Medical):   Physical Activity:   . Days of Exercise per Week:   . Minutes of Exercise per Session:   Stress:   . Feeling of Stress :   Social Connections:   . Frequency of Communication with Friends and Family:   . Frequency of Social Gatherings with Friends and Family:   . Attends Religious Services:   . Active Member of Clubs or Organizations:   . Attends Archivist Meetings:   Marland Kitchen Marital Status:   Intimate Partner Violence:   . Fear of Current or Ex-Partner:   . Emotionally Abused:   Marland Kitchen Physically Abused:   . Sexually Abused:     Outpatient Medications Prior to Visit  Medication Sig Dispense Refill  . Omega-3 Fatty Acids (FISH OIL PO) Take 4,400 mg by mouth daily.    Marland Kitchen terbinafine (LAMISIL) 250 MG tablet Take 1 tablet (250 mg total) by mouth daily. 90 tablet 0   No facility-administered medications prior to visit.    No Known Allergies  Review of Systems Pertinent  positives in HPI and otherwise negative.    Objective:    Physical Exam Vitals reviewed.  Constitutional:      Appearance: Normal appearance.  HENT:     Head: Normocephalic and atraumatic.  Cardiovascular:     Rate and Rhythm: Normal rate and regular rhythm.  Pulmonary:     Effort: Pulmonary effort is normal.     Breath sounds: Normal breath sounds.  Chest:     Breasts:        Right: Normal.        Left: Mass present. No swelling, bleeding, inverted nipple, nipple discharge, skin change or tenderness.       Comments: Hard pea sized nodule- see the top red marker in photo. No supraclavicular or axillary lymphadenopathy. The rest of the left breast is normal. The right breast exam is normal.  Musculoskeletal:     Cervical back: Normal range of motion and neck  supple.  Lymphadenopathy:     Upper Body:     Right upper body: No supraclavicular, axillary or pectoral adenopathy.     Left upper body: Pectoral adenopathy present. No supraclavicular or axillary adenopathy.  Skin:    General: Skin is warm and dry.  Neurological:     General: No focal deficit present.     Mental Status: He is alert and oriented to person, place, and time.  Psychiatric:        Mood and Affect: Mood normal.        Behavior: Behavior normal.        Thought Content: Thought content normal.        Judgment: Judgment normal.    BP 116/76 (BP Location: Left Arm, Patient Position: Sitting)   Pulse 70   Temp (!) 97.4 F (36.3 C)   Ht 5' 7.99" (1.727 m)   Wt 165 lb 3.2 oz (74.9 kg)   SpO2 98%   BMI 25.12 kg/m  Wt Readings from Last 3 Encounters:  01/12/20 165 lb 3.2 oz (74.9 kg)  10/03/19 167 lb (75.8 kg)  06/29/19 157 lb (71.2 kg)     Health Maintenance Due  Topic Date Due  . TETANUS/TDAP  07/04/2018    There are no preventive care reminders to display for this patient.  Lab Results  Component Value Date   TSH 2.53 06/28/2019   Lab Results  Component Value Date   WBC 4.4 06/28/2019   HGB 14.9 06/28/2019   HCT 44.3 06/28/2019   MCV 96.1 06/28/2019   PLT 226.0 06/28/2019   Lab Results  Component Value Date   NA 138 10/03/2019   K 3.8 10/03/2019   CO2 30 10/03/2019   GLUCOSE 79 10/03/2019   BUN 18 10/03/2019   CREATININE 1.12 10/03/2019   BILITOT 0.6 12/12/2019   ALKPHOS 71 12/12/2019   AST 16 12/12/2019   ALT 18 12/12/2019   PROT 6.5 12/12/2019   ALBUMIN 4.2 12/12/2019   CALCIUM 9.2 10/03/2019   GFR 65.15 10/03/2019   Lab Results  Component Value Date   CHOL 247 (H) 09/29/2019   Lab Results  Component Value Date   HDL 41.80 09/29/2019   Lab Results  Component Value Date   LDLCALC 186 (H) 09/29/2019   Lab Results  Component Value Date   TRIG 94.0 09/29/2019   Lab Results  Component Value Date   CHOLHDL 6 09/29/2019   Lab  Results  Component Value Date   HGBA1C 5.9 09/29/2019  Assessment & Plan:   Problem List Items Addressed This Visit      Other   Breast lump on left side at 12 o'clock position - Primary   Relevant Orders   MM DIAG BREAST TOMO BILATERAL   US BREAST LTD UNI LEFT INC AXILLA     No orders of the defined types were placed in this encounter.  We discussed that the hard nodule is not diagnosed, yet.  The first step is the mammogram and Mr. Zou will need to call and schedule it himself.  Please call and schedule your 3D mammogram as discussed.  [facility]  [address] [place], [place]  [phone]  Follow-up: No follow-ups on file.   This visit occurred during the SARS-CoV-2 public health emergency.  Safety protocols were in place, including screening questions prior to the visit, additional usage of staff PPE, and extensive cleaning of exam room while observing appropriate contact time as indicated for disinfecting solutions.    Denice Paradise, NP

## 2020-01-12 NOTE — Telephone Encounter (Signed)
Saw kim today

## 2020-01-12 NOTE — Patient Instructions (Addendum)
Please call and schedule your 3D mammogram as discussed.  [facility]  [address] [place], [place]  [phone]   Fibroadenoma Fibroadenoma is a breast tumor that is not cancerous (is benign). These tumors are made up of breast tissue and the tissue that holds breast tissue together (connective tissue). There are several types:  Simple fibroadenoma. This is the most common type. It contains only one type of tissue.  Complex fibroadenoma. This type contains more than one kind of tissue or irregular tissue, such as pockets of fluid (cysts) or deposits of calcium (calcifications) in the breast.  Juvenile fibroadenoma. This is a type of tumor that can develop in adolescent girls. It tends to grow larger over time than other adenomas. Although fibroadenomas are not cancerous, having a fibroadenoma may slightly increase your risk for developing breast cancer in the future. What are the causes? The cause of fibroadenoma is not known. What increases the risk? This condition is more likely to develop in:  Women who are 23-60 years old.  Women of African American descent. What are the signs or symptoms? You might have no symptoms. Some fibroadenomas are too small to feel. If you can feel it, it may feel like a lump in your breast that is:  Firm.  Round.  Smooth.  Slightly movable. A fibroadenoma usually occurs as a single lump, but sometimes there may be more than one lump. They can occur in one breast or in both breasts. Fibroadenomas vary in size. They usually do not cause pain unless they grow to a large size. How is this diagnosed? This condition may be diagnosed based on:  Your symptoms and medical history.  A physical exam. You may notice a lump during a breast self-exam, or your health care provider may notice it during a routine breast exam or breast X-ray (mammogram).  An ultrasound to check for fluid inside the lump (cystic tumor). If  there is fluid, some fluid may be removed with a needle and examined under a microscope.  A mammogram to examine a lump that does not contain fluid (solid). Depending on mammogram results, you may need to have a procedure to remove a tissue sample from the lump using a needle (breast biopsy). The tissue will be examined under a microscope. How is this treated? Treatment for this condition may include:  Having clinical breast exams regularly to check for changes in your fibroadenoma.  Having the fibroadenoma removed. A fibroadenoma may be removed if it is: ? Large. ? Continuing to grow. ? Causing symptoms, such as pain or a change in the appearance of your breast. ? A juvenile fibroadenoma. These tend to grow large over time. Follow these instructions at home:   Do breast self-exams at home as told by your health care provider. Monitor your fibroadenoma, the skin of your breasts, and your nipples for any changes.  Do not use any products that contain nicotine or tobacco, such as cigarettes and e-cigarettes. These can further increase your cancer risk. If you need help quitting, ask your health care provider.  Keep all follow-up visits as told by your health care provider. This is important. You will need breast exams on a regular basis. Contact a health care provider if:  Your fibroadenoma: ? Gets larger. ? Feels different. ? Becomes painful.  You find a new breast lump.  You have any changes in your breast skin, such as: ? Dimpling. ? Bruising. ? Thickening. ? Redness.  You have any changes  in your nipple, such as: ? Fluid leaking from a nipple. ? Redness. Summary  Fibroadenoma is a breast tumor that is not cancerous (is benign) and is made up of breast tissue and the tissue that holds breast tissue together (connective tissue).  Although fibroadenomas are not cancerous, having a fibroadenoma may slightly increase your risk for developing breast cancer in the future.  A  fibroadenoma may feel like a lump in your breast. It is usually firm, round, smooth, and slightly movable. Some fibroadenomas are too small to be felt.  Do breast self-exams at home as told by your health care provider. Monitor your fibroadenoma, the skin of your breasts, and your nipples for any changes. This information is not intended to replace advice given to you by your health care provider. Make sure you discuss any questions you have with your health care provider. Document Revised: 07/09/2017 Document Reviewed: 06/29/2017 Elsevier Patient Education  2020 [employer].

## 2020-01-15 ENCOUNTER — Encounter: Payer: Self-pay | Admitting: Nurse Practitioner

## 2020-01-15 DIAGNOSIS — N6325 Unspecified lump in the left breast, overlapping quadrants: Secondary | ICD-10-CM | POA: Insufficient documentation

## 2020-02-06 ENCOUNTER — Other Ambulatory Visit: Payer: Self-pay

## 2020-02-14 ENCOUNTER — Ambulatory Visit
Admission: RE | Admit: 2020-02-14 | Discharge: 2020-02-14 | Disposition: A | Discharge status: Home / Self Care | Payer: PPO | Source: Ambulatory Visit | Attending: Nurse Practitioner | Admitting: Nurse Practitioner

## 2020-02-14 DIAGNOSIS — N6325 Unspecified lump in the left breast, overlapping quadrants: Secondary | ICD-10-CM | POA: Insufficient documentation

## 2020-04-03 DIAGNOSIS — R208 Other disturbances of skin sensation: Secondary | ICD-10-CM | POA: Diagnosis not present

## 2020-04-03 DIAGNOSIS — D225 Melanocytic nevi of trunk: Secondary | ICD-10-CM | POA: Diagnosis not present

## 2020-04-03 DIAGNOSIS — L821 Other seborrheic keratosis: Secondary | ICD-10-CM | POA: Diagnosis not present

## 2020-04-03 DIAGNOSIS — D2261 Melanocytic nevi of right upper limb, including shoulder: Secondary | ICD-10-CM | POA: Diagnosis not present

## 2020-04-03 DIAGNOSIS — D2262 Melanocytic nevi of left upper limb, including shoulder: Secondary | ICD-10-CM | POA: Diagnosis not present

## 2020-04-03 DIAGNOSIS — L820 Inflamed seborrheic keratosis: Secondary | ICD-10-CM | POA: Diagnosis not present

## 2020-04-03 DIAGNOSIS — D2271 Melanocytic nevi of right lower limb, including hip: Secondary | ICD-10-CM | POA: Diagnosis not present

## 2020-04-03 DIAGNOSIS — L298 Other pruritus: Secondary | ICD-10-CM | POA: Diagnosis not present

## 2020-04-03 DIAGNOSIS — L538 Other specified erythematous conditions: Secondary | ICD-10-CM | POA: Diagnosis not present

## 2020-04-03 DIAGNOSIS — D2272 Melanocytic nevi of left lower limb, including hip: Secondary | ICD-10-CM | POA: Diagnosis not present

## 2020-04-17 ENCOUNTER — Encounter: Payer: Self-pay | Admitting: Internal Medicine

## 2020-04-23 ENCOUNTER — Telehealth: Payer: Self-pay | Admitting: *Deleted

## 2020-04-23 DIAGNOSIS — R944 Abnormal results of kidney function studies: Secondary | ICD-10-CM

## 2020-04-23 DIAGNOSIS — D649 Anemia, unspecified: Secondary | ICD-10-CM

## 2020-04-23 DIAGNOSIS — R739 Hyperglycemia, unspecified: Secondary | ICD-10-CM

## 2020-04-23 DIAGNOSIS — Z125 Encounter for screening for malignant neoplasm of prostate: Secondary | ICD-10-CM

## 2020-04-23 DIAGNOSIS — M791 Myalgia, unspecified site: Secondary | ICD-10-CM

## 2020-04-23 DIAGNOSIS — E7800 Pure hypercholesterolemia, unspecified: Secondary | ICD-10-CM

## 2020-04-23 NOTE — Telephone Encounter (Signed)
Please place future orders for lab appt.  

## 2020-04-24 ENCOUNTER — Other Ambulatory Visit (INDEPENDENT_AMBULATORY_CARE_PROVIDER_SITE_OTHER): Payer: PPO

## 2020-04-24 ENCOUNTER — Other Ambulatory Visit: Payer: Self-pay

## 2020-04-24 DIAGNOSIS — E7800 Pure hypercholesterolemia, unspecified: Secondary | ICD-10-CM | POA: Diagnosis not present

## 2020-04-24 DIAGNOSIS — D649 Anemia, unspecified: Secondary | ICD-10-CM

## 2020-04-24 DIAGNOSIS — R739 Hyperglycemia, unspecified: Secondary | ICD-10-CM

## 2020-04-24 DIAGNOSIS — R944 Abnormal results of kidney function studies: Secondary | ICD-10-CM

## 2020-04-24 DIAGNOSIS — M791 Myalgia, unspecified site: Secondary | ICD-10-CM | POA: Diagnosis not present

## 2020-04-24 DIAGNOSIS — Z125 Encounter for screening for malignant neoplasm of prostate: Secondary | ICD-10-CM

## 2020-04-24 LAB — CBC WITH DIFFERENTIAL/PLATELET
Basophils Absolute: 0 10*3/uL (ref 0.0–0.1)
Basophils Relative: 0.6 % (ref 0.0–3.0)
Eosinophils Absolute: 0.1 10*3/uL (ref 0.0–0.7)
Eosinophils Relative: 3 % (ref 0.0–5.0)
HCT: 45.2 % (ref 39.0–52.0)
Hemoglobin: 15.2 g/dL (ref 13.0–17.0)
Lymphocytes Relative: 42.6 % (ref 12.0–46.0)
Lymphs Abs: 1.8 10*3/uL (ref 0.7–4.0)
MCHC: 33.6 g/dL (ref 30.0–36.0)
MCV: 96.2 fl (ref 78.0–100.0)
Monocytes Absolute: 0.4 10*3/uL (ref 0.1–1.0)
Monocytes Relative: 10.3 % (ref 3.0–12.0)
Neutro Abs: 1.8 10*3/uL (ref 1.4–7.7)
Neutrophils Relative %: 43.5 % (ref 43.0–77.0)
Platelets: 210 10*3/uL (ref 150.0–400.0)
RBC: 4.7 Mil/uL (ref 4.22–5.81)
RDW: 13.5 % (ref 11.5–15.5)
WBC: 4.2 10*3/uL (ref 4.0–10.5)

## 2020-04-24 LAB — HEPATIC FUNCTION PANEL
ALT: 26 U/L (ref 0–53)
AST: 18 U/L (ref 0–37)
Albumin: 4.2 g/dL (ref 3.5–5.2)
Alkaline Phosphatase: 67 U/L (ref 39–117)
Bilirubin, Direct: 0.1 mg/dL (ref 0.0–0.3)
Total Bilirubin: 0.8 mg/dL (ref 0.2–1.2)
Total Protein: 6.7 g/dL (ref 6.0–8.3)

## 2020-04-24 LAB — LIPID PANEL
Cholesterol: 240 mg/dL — ABNORMAL HIGH (ref 0–200)
HDL: 40.2 mg/dL (ref >39.00)
LDL Cholesterol: 167 mg/dL — ABNORMAL HIGH (ref 0–99)
NonHDL: 199.82
Total CHOL/HDL Ratio: 6
Triglycerides: 166 mg/dL — ABNORMAL HIGH (ref 0.0–149.0)
VLDL: 33.2 mg/dL (ref 0.0–40.0)

## 2020-04-24 LAB — HEMOGLOBIN A1C: Hgb A1c MFr Bld: 6.1 % (ref 4.6–6.5)

## 2020-04-24 LAB — BASIC METABOLIC PANEL
BUN: 15 mg/dL (ref 6–23)
CO2: 30 mEq/L (ref 19–32)
Calcium: 9.3 mg/dL (ref 8.4–10.5)
Chloride: 102 mEq/L (ref 96–112)
Creatinine, Ser: 1.12 mg/dL (ref 0.40–1.50)
GFR: 66.76 mL/min (ref >60.00)
Glucose, Bld: 94 mg/dL (ref 70–99)
Potassium: 4.5 mEq/L (ref 3.5–5.1)
Sodium: 138 mEq/L (ref 135–145)

## 2020-04-24 LAB — PSA: PSA: 1.62 ng/mL (ref 0.10–4.00)

## 2020-04-24 LAB — CK: Total CK: 89 U/L (ref 7–232)

## 2020-04-24 LAB — SEDIMENTATION RATE: Sed Rate: 2 mm/hr (ref 0–20)

## 2020-04-24 NOTE — Telephone Encounter (Signed)
Lab orders placed.  

## 2020-05-23 ENCOUNTER — Ambulatory Visit (INDEPENDENT_AMBULATORY_CARE_PROVIDER_SITE_OTHER): Payer: PPO

## 2020-05-23 VITALS — Ht 67.99 in | Wt 165.0 lb

## 2020-05-23 DIAGNOSIS — Z0000 Encounter for general adult medical examination without abnormal findings: Secondary | ICD-10-CM | POA: Diagnosis not present

## 2020-05-23 NOTE — Patient Instructions (Addendum)
[name] , Thank you for taking time to come for your Medicare Wellness Visit. I appreciate your ongoing commitment to your health goals. Please review the following plan we discussed and let me know if I can assist you in the future.   These are the goals we discussed: Goals      Patient Stated   .  Low cholesterol/carb diet (pt-stated)      Stay active       This is a list of the screening recommended for you and due dates:  Health Maintenance  Topic Date Due  . Tetanus Vaccine  07/04/2018  . Colon Cancer Screening  11/01/2023  . Flu Shot  Completed  . COVID-19 Vaccine  Completed  .  Hepatitis C: One time screening is recommended by Center for Disease Control  (CDC) for  adults born from [birth year] through 1965.   Completed  . Pneumonia vaccines  Completed    Immunizations Immunization History  Administered Date(s) Administered  . Fluad Quad(high Dose 65+) 03/21/2019  . Influenza Whole 06/22/2016  . Influenza, High Dose Seasonal PF 04/08/2020  . Influenza,inj,Quad PF,6+ Mos 05/08/2014, 05/22/2015  . Influenza-Unspecified 04/05/2013, 03/18/2017, 04/26/2018  . PFIZER SARS-COV-2 Vaccination 08/27/2019, 09/20/2019, 05/09/2020  . PPD Test 08/29/2014  . Pneumococcal Conjugate-13 09/01/2016  . Pneumococcal Polysaccharide-23 03/24/2018  . Tdap 07/04/2008  . Zoster 01/23/2011   Follow up 06/24/20 @ 11:00; discuss muscle weakness/discomfort re statins and intermittent itchy rash since receiving first covid vaccine.   Advanced directives: End of life planning; Advance aging; Advanced directives discussed.  Copy of current HCPOA/Living Will requested.    Conditions/risks identified: none new.   Follow up in one year for your annual wellness visit.  Preventive Care 15 Years and Older, Male Preventive care refers to lifestyle choices and visits with your health care provider that can promote health and wellness. What does preventive care include?  A yearly physical exam. This is  also called an annual well check.  Dental exams once or twice a year.  Routine eye exams. Ask your health care provider how often you should have your eyes checked.  Personal lifestyle choices, including:  Daily care of your teeth and gums.  Regular physical activity.  Eating a healthy diet.  Avoiding tobacco and drug use.  Limiting alcohol use.  Practicing safe sex.  Taking low doses of aspirin every day.  Taking vitamin and mineral supplements as recommended by your health care provider. What happens during an annual well check? The services and screenings done by your health care provider during your annual well check will depend on your age, overall health, lifestyle risk factors, and family history of disease. Counseling  Your health care provider may ask you questions about your:  Alcohol use.  Tobacco use.  Drug use.  Emotional well-being.  Home and relationship well-being.  Sexual activity.  Eating habits.  History of falls.  Memory and ability to understand (cognition).  Work and work Statistician. Screening  You may have the following tests or measurements:  Height, weight, and BMI.  Blood pressure.  Lipid and cholesterol levels. These may be checked every 5 years, or more frequently if you are over 35 years old.  Skin check.  Lung cancer screening. You may have this screening every year starting at age 48 if you have a 30-pack-year history of smoking and currently smoke or have quit within the past 15 years.  Fecal occult blood test (FOBT) of the stool. You may have this test  every year starting at age 60.  Flexible sigmoidoscopy or colonoscopy. You may have a sigmoidoscopy every 5 years or a colonoscopy every 10 years starting at age 21.  Prostate cancer screening. Recommendations will vary depending on your family history and other risks.  Hepatitis C blood test.  Hepatitis B blood test.  Sexually transmitted disease (STD)  testing.  Diabetes screening. This is done by checking your blood sugar (glucose) after you have not eaten for a while (fasting). You may have this done every 1-3 years.  Abdominal aortic aneurysm (AAA) screening. You may need this if you are a current or former smoker.  Osteoporosis. You may be screened starting at age 26 if you are at high risk. Talk with your health care provider about your test results, treatment options, and if necessary, the need for more tests. Vaccines  Your health care provider may recommend certain vaccines, such as:  Influenza vaccine. This is recommended every year.  Tetanus, diphtheria, and acellular pertussis (Tdap, Td) vaccine. You may need a Td booster every 10 years.  Zoster vaccine. You may need this after age 59.  Pneumococcal 13-valent conjugate (PCV13) vaccine. One dose is recommended after age 52.  Pneumococcal polysaccharide (PPSV23) vaccine. One dose is recommended after age 32. Talk to your health care provider about which screenings and vaccines you need and how often you need them. This information is not intended to replace advice given to you by your health care provider. Make sure you discuss any questions you have with your health care provider. Document Released: 07/26/2015 Document Revised: 03/18/2016 Document Reviewed: 04/30/2015 Elsevier Interactive Patient Education  2017 [place] Prevention in the Home Falls can cause injuries. They can happen to people of all ages. There are many things you can do to make your home safe and to help prevent falls. What can I do on the outside of my home?  Regularly fix the edges of walkways and driveways and fix any cracks.  Remove anything that might make you trip as you walk through a door, such as a raised step or threshold.  Trim any bushes or trees on the path to your home.  Use bright outdoor lighting.  Clear any walking paths of anything that might make someone trip, such as  rocks or tools.  Regularly check to see if handrails are loose or broken. Make sure that both sides of any steps have handrails.  Any raised decks and porches should have guardrails on the edges.  Have any leaves, snow, or ice cleared regularly.  Use sand or salt on walking paths during winter.  Clean up any spills in your garage right away. This includes oil or grease spills. What can I do in the bathroom?  Use night lights.  Install grab bars by the toilet and in the tub and shower. Do not use towel bars as grab bars.  Use non-skid mats or decals in the tub or shower.  If you need to sit down in the shower, use a plastic, non-slip stool.  Keep the floor dry. Clean up any water that spills on the floor as soon as it happens.  Remove soap buildup in the tub or shower regularly.  Attach bath mats securely with double-sided non-slip rug tape.  Do not have throw rugs and other things on the floor that can make you trip. What can I do in the bedroom?  Use night lights.  Make sure that you have a light by your bed  that is easy to reach.  Do not use any sheets or blankets that are too big for your bed. They should not hang down onto the floor.  Have a firm chair that has side arms. You can use this for support while you get dressed.  Do not have throw rugs and other things on the floor that can make you trip. What can I do in the kitchen?  Clean up any spills right away.  Avoid walking on wet floors.  Keep items that you use a lot in easy-to-reach places.  If you need to reach something above you, use a strong step stool that has a grab bar.  Keep electrical cords out of the way.  Do not use floor polish or wax that makes floors slippery. If you must use wax, use non-skid floor wax.  Do not have throw rugs and other things on the floor that can make you trip. What can I do with my stairs?  Do not leave any items on the stairs.  Make sure that there are handrails on  both sides of the stairs and use them. Fix handrails that are broken or loose. Make sure that handrails are as long as the stairways.  Check any carpeting to make sure that it is firmly attached to the stairs. Fix any carpet that is loose or worn.  Avoid having throw rugs at the top or bottom of the stairs. If you do have throw rugs, attach them to the floor with carpet tape.  Make sure that you have a light switch at the top of the stairs and the bottom of the stairs. If you do not have them, ask someone to add them for you. What else can I do to help prevent falls?  Wear shoes that:  Do not have high heels.  Have rubber bottoms.  Are comfortable and fit you well.  Are closed at the toe. Do not wear sandals.  If you use a stepladder:  Make sure that it is fully opened. Do not climb a closed stepladder.  Make sure that both sides of the stepladder are locked into place.  Ask someone to hold it for you, if possible.  Clearly mark and make sure that you can see:  Any grab bars or handrails.  First and last steps.  Where the edge of each step is.  Use tools that help you move around (mobility aids) if they are needed. These include:  Canes.  Walkers.  Scooters.  Crutches.  Turn on the lights when you go into a dark area. Replace any light bulbs as soon as they burn out.  Set up your furniture so you have a clear path. Avoid moving your furniture around.  If any of your floors are uneven, fix them.  If there are any pets around you, be aware of where they are.  Review your medicines with your doctor. Some medicines can make you feel dizzy. This can increase your chance of falling. Ask your doctor what other things that you can do to help prevent falls. This information is not intended to replace advice given to you by your health care provider. Make sure you discuss any questions you have with your health care provider. Document Released: 04/25/2009 Document  Revised: 12/05/2015 Document Reviewed: 08/03/2014 Elsevier Interactive Patient Education  2017 [employer].

## 2020-05-23 NOTE — Progress Notes (Addendum)
Subjective:   Elijah Walker is a 69 y.o. male who presents for Medicare Annual/Subsequent preventive examination.  Review of Systems    No ROS.  Medicare Wellness Virtual Visit.    Cardiac Risk Factors include: advanced age (>54men, >38 women);male gender     Objective:    Today's Vitals   05/23/20 0838  Weight: 165 lb (74.8 kg)  Height: 5' 7.99" (1.727 m)   Body mass index is 25.1 kg/m.  Advanced Directives 05/23/2020 05/23/2019 05/17/2018 09/14/2017 09/07/2017  Does Patient Have a Medical Advance Directive? Yes No No No No  Type of Paramedic of [place];Living will - - - -  Does patient want to make changes to medical advance directive? No - Patient declined - - - -  Copy of [place] in Chart? No - copy requested - - - -  Would patient like information on creating a medical advance directive? - No - Patient declined No - Patient declined No - Patient declined No - Patient declined    Current Medications (verified) Outpatient Encounter Medications as of 05/23/2020  Medication Sig  . Omega-3 Fatty Acids (FISH OIL PO) Take 4,400 mg by mouth daily.  Marland Kitchen terbinafine (LAMISIL) 250 MG tablet Take 1 tablet (250 mg total) by mouth daily.   No facility-administered encounter medications on file as of 05/23/2020.    Allergies (verified) Patient has no known allergies.   History: Past Medical History:  Diagnosis Date  . Blood in stool    H/O  . History of chicken pox   . Hyperlipidemia    Past Surgical History:  Procedure Laterality Date  . CHOLECYSTECTOMY  1998  . COLONOSCOPY    . SHOULDER ARTHROSCOPY WITH BANKART REPAIR Left 09/14/2017   Procedure: SHOULDER ARTHROSCOPY WITH MINI OPEN ROTATOR CUFF REPAIR, SUBACROMINAL DECOMPRESSION, LABRAL REPAIR,BICEP TENOTOMY, DISTAL CLAVICLE EXCISION ;  Surgeon: Thornton Park, MD;  Location: [facility] ORS;  Service: Orthopedics;  Laterality: Left;   Family History  Problem Relation Age of Onset    . Dementia Mother   . Diabetes Father   . Colitis Sister   . Autoimmune disease Brother   . Colitis Daughter    Social History   Socioeconomic History  . Marital status: Married    Spouse name: Not on file  . Number of children: Not on file  . Years of education: Not on file  . Highest education level: Not on file  Occupational History  . Not on file  Tobacco Use  . Smoking status: Never Smoker  . Smokeless tobacco: Never Used  Vaping Use  . Vaping Use: Never used  Substance and Sexual Activity  . Alcohol use: No    Alcohol/week: 0.0 standard drinks    Comment: occ  . Drug use: No  . Sexual activity: Not on file  Other Topics Concern  . Not on file  Social History Narrative  . Not on file   Social Determinants of Health   Financial Resource Strain: Low Risk   . Difficulty of Paying Living Expenses: Not hard at all  Food Insecurity: No Food Insecurity  . Worried About Charity fundraiser in the Last Year: Never true  . Ran Out of Food in the Last Year: Never true  Transportation Needs: No Transportation Needs  . Lack of Transportation (Medical): No  . Lack of Transportation (Non-Medical): No  Physical Activity: Sufficiently Active  . Days of Exercise per Week: 4 days  . Minutes of Exercise per  Session: 150+ min  Stress: No Stress Concern Present  . Feeling of Stress : Not at all  Social Connections: Unknown  . Frequency of Communication with Friends and Family: Not on file  . Frequency of Social Gatherings with Friends and Family: Not on file  . Attends Religious Services: Not on file  . Active Member of Clubs or Organizations: Not on file  . Attends Archivist Meetings: Not on file  . Marital Status: Married    Tobacco Counseling Counseling given: Not Answered   Clinical Intake:  Pre-visit preparation completed: Yes        Diabetes: No  How often do you need to have someone help you when you read instructions, pamphlets, or other  written materials from your doctor or pharmacy?: 1 - Never   Interpreter Needed?: No      Activities of Daily Living In your present state of health, do you have any difficulty performing the following activities: 05/23/2020  Hearing? N  Vision? N  Difficulty concentrating or making decisions? N  Walking or climbing stairs? N  Dressing or bathing? N  Doing errands, shopping? N  Preparing Food and eating ? N  Using the Toilet? N  In the past six months, have you accidently leaked urine? N  Do you have problems with loss of bowel control? N  Managing your Medications? N  Managing your Finances? N  Housekeeping or managing your Housekeeping? N  Some recent data might be hidden    Patient Care Team: Elijah Pheasant, MD as PCP - General (Internal Medicine)  Indicate any recent Medical Services you may have received from other than [facility] providers in the past year (date may be approximate).     Assessment:   This is a routine wellness examination for Elijah Walker.  I connected with Elijah Walker today by telephone and verified that I am speaking with the correct person using two identifiers. Location patient: home Location provider: work Persons participating in the virtual visit: patient, Marine scientist.    I discussed the limitations, risks, security and privacy concerns of performing an evaluation and management service by telephone and the availability of in person appointments. The patient expressed understanding and verbally consented to this telephonic visit.    Interactive audio and video telecommunications were attempted between this provider and patient, however failed, due to patient having technical difficulties OR patient did not have access to video capability.  We continued and completed visit with audio only.  Some vital signs may be absent or patient reported.   Hearing/Vision screen  Hearing Screening   125Hz  250Hz  500Hz  1000Hz  2000Hz  3000Hz  4000Hz  6000Hz  8000Hz   Right ear:            Left ear:           Comments: Patient is able to hear conversational tones without difficulty.  No issues reported.  Vision Screening Comments: Wears corrective lenses  Visual acuity not assessed per patient preference since they have regular follow up with the ophthalmologist  Dietary issues and exercise activities discussed: Current Exercise Habits: Home exercise routine, Type of exercise: walking, Time (Minutes): > 60, Frequency (Times/Week): 5, Weekly Exercise (Minutes/Week): 0, Intensity: Mild  Healthy diet Good water intake  Goals      Patient Stated   .  Low cholesterol/carb diet (pt-stated)      Stay active      Depression Screen PHQ 2/9 Scores 05/23/2020 01/12/2020 06/29/2019 05/23/2019 03/23/2019 05/17/2018 02/25/2017  PHQ - 2 Score 0 0 0  0 0 0 0  PHQ- 9 Score - - 0 - 0 - -    Fall Risk Fall Risk  05/23/2020 02/06/2020 01/12/2020 06/07/2019 05/23/2019  Falls in the past year? 0 0 0 0 0  Comment - Emmi Telephone Survey: data to providers prior to load - Emmi Telephone Survey: data to providers prior to load -  Number falls in past yr: 0 - 0 - -  Injury with Fall? - - 0 - -  Follow up Falls evaluation completed - Falls evaluation completed - Falls prevention discussed   Handrails in use when climbing stairs? Yes Home free of loose throw rugs in walkways, pet beds, electrical cords, etc? Yes  Adequate lighting in your home to reduce risk of falls? Yes   ASSISTIVE DEVICES UTILIZED TO PREVENT FALLS: Use of a cane, walker or w/c? No   TIMED UP AND GO: Was the test performed? No . Virtual visit.   Cognitive Function: Patient is alert and oriented x3.  Denies difficulty focusing, making decisions, memory loss.  MMSE/6CIT deferred. Normal by direct communication/observation.  MMSE - Mini Mental State Exam 05/17/2018  Orientation to time 5  Orientation to Place 5  Registration 3  Attention/ Calculation 5  Recall 3  Language- name 2 objects 2  Language- repeat 1    Language- follow 3 step command 3  Language- read & follow direction 1  Write a sentence 1  Copy design 1  Total score 30     6CIT Screen 05/23/2019  What Year? 0 points  What month? 0 points  What time? 0 points  Count back from 20 0 points  Months in reverse 0 points  Repeat phrase 0 points  Total Score 0    Immunizations Immunization History  Administered Date(s) Administered  . Fluad Quad(high Dose 65+) 03/21/2019  . Influenza Whole 06/22/2016  . Influenza, High Dose Seasonal PF 04/08/2020  . Influenza,inj,Quad PF,6+ Mos 05/08/2014, 05/22/2015  . Influenza-Unspecified 04/05/2013, 03/18/2017, 04/26/2018  . PFIZER SARS-COV-2 Vaccination 08/27/2019, 09/20/2019, 05/09/2020  . PPD Test 08/29/2014  . Pneumococcal Conjugate-13 09/01/2016  . Pneumococcal Polysaccharide-23 03/24/2018  . Tdap 07/04/2008  . Zoster 01/23/2011   TDAP status: Due, Education has been provided regarding the importance of this vaccine. Advised may receive this vaccine at local pharmacy or Health Dept. Aware to provide a copy of the vaccination record if obtained from local pharmacy or Health Dept. Verbalized acceptance and understanding. Deferred.   Health Maintenance Health Maintenance  Topic Date Due  . TETANUS/TDAP  07/04/2018  . COLONOSCOPY  11/01/2023  . INFLUENZA VACCINE  Completed  . COVID-19 Vaccine  Completed  . Hepatitis C Screening  Completed  . PNA vac Low Risk Adult  Completed  Colorectal cancer screening: Completed 10/31/13. Repeat every 10 years  Lung Cancer Screening: (Low Dose CT Chest recommended if Age 76-80 years, 30 pack-year currently smoking OR have quit w/in 15years.) does not qualify.   Hepatitis C Screening: Completed 05/17/18.  Vision Screening: Recommended annual ophthalmology exams for early detection of glaucoma and other disorders of the eye. Is the patient up to date with their annual eye exam?  Yes  Who is the provider or what is the name of the office in which  the patient attends annual eye exams? [facility] Screening: Recommended annual dental exams for proper oral hygiene  Community Resource Referral / Chronic Care Management: CRR required this visit?  No   CCM required this visit?  No  Plan:   Keep all routine maintenance appointments.   Follow up 06/24/20 @ 11:00; discuss muscle weakness/discomfort re statins and intermittent itchy rash since receiving first covid vaccine.   I have personally reviewed and noted the following in the patient's chart:   . Medical and social history . Use of alcohol, tobacco or illicit drugs  . Current medications and supplements . Functional ability and status . Nutritional status . Physical activity . Advanced directives . List of other physicians . Hospitalizations, surgeries, and ER visits in previous 12 months . Vitals . Screenings to include cognitive, depression, and falls . Referrals and appointments  In addition, I have reviewed and discussed with patient certain preventive protocols, quality metrics, and best practice recommendations. A written personalized care plan for preventive services as well as general preventive health recommendations were provided to patient via mychart.     Varney Biles, LPN   16/04/[id number]    Reviewed above information.  Will d/w Alecia Doi regarding if pt needs earlier appt for evaluation.   Dr Nicki Reaper

## 2020-06-24 ENCOUNTER — Encounter: Payer: Self-pay | Admitting: Internal Medicine

## 2020-06-24 ENCOUNTER — Other Ambulatory Visit: Payer: Self-pay

## 2020-06-24 ENCOUNTER — Ambulatory Visit (INDEPENDENT_AMBULATORY_CARE_PROVIDER_SITE_OTHER): Payer: PPO | Admitting: Internal Medicine

## 2020-06-24 DIAGNOSIS — D649 Anemia, unspecified: Secondary | ICD-10-CM | POA: Diagnosis not present

## 2020-06-24 DIAGNOSIS — R944 Abnormal results of kidney function studies: Secondary | ICD-10-CM

## 2020-06-24 DIAGNOSIS — M25512 Pain in left shoulder: Secondary | ICD-10-CM

## 2020-06-24 DIAGNOSIS — R21 Rash and other nonspecific skin eruption: Secondary | ICD-10-CM | POA: Diagnosis not present

## 2020-06-24 DIAGNOSIS — R739 Hyperglycemia, unspecified: Secondary | ICD-10-CM

## 2020-06-24 DIAGNOSIS — M25511 Pain in right shoulder: Secondary | ICD-10-CM | POA: Diagnosis not present

## 2020-06-24 DIAGNOSIS — E7800 Pure hypercholesterolemia, unspecified: Secondary | ICD-10-CM

## 2020-06-24 DIAGNOSIS — Z86010 Personal history of colonic polyps: Secondary | ICD-10-CM | POA: Diagnosis not present

## 2020-06-24 DIAGNOSIS — F909 Attention-deficit hyperactivity disorder, unspecified type: Secondary | ICD-10-CM

## 2020-06-24 NOTE — Progress Notes (Addendum)
Patient ID: Elijah Walker, male   DOB: 1951/03/06, 69 y.o.   MRN: [id number]   Subjective:    Patient ID: Elijah Walker, male    DOB: 11-17-1950, 69 y.o.   MRN: [id number]  HPI This visit occurred during the SARS-CoV-2 public health emergency.  Safety protocols were in place, including screening questions prior to the visit, additional usage of staff PPE, and extensive cleaning of exam room while observing appropriate contact time as indicated for disinfecting solutions.  Patient here for a scheduled follow up.  Here to follow up regarding his cholesterol.  Also reports pain - right shoulder.  Notices increased discomfort especially at or above 90 degrees.  Involves upper arm muscles.  Right > left - right bothering him now.  Discussed ortho referral.  Is running.  Tries to stay active.  No chest pain or sob reported.  No abdominal pain or bowel change reported.  Has noticed intermittent rash - light, itchy raised.  No trigger. Only last for brief period and resolves.  Not persistent.  No rash today.  No fever. No headache.  Has seen dermatology.  No breast nodule.    Past Medical History:  Diagnosis Date  . Blood in stool    H/O  . History of chicken pox   . Hyperlipidemia    Past Surgical History:  Procedure Laterality Date  . CHOLECYSTECTOMY  1998  . COLONOSCOPY    . SHOULDER ARTHROSCOPY WITH BANKART REPAIR Left 09/14/2017   Procedure: SHOULDER ARTHROSCOPY WITH MINI OPEN ROTATOR CUFF REPAIR, SUBACROMINAL DECOMPRESSION, LABRAL REPAIR,BICEP TENOTOMY, DISTAL CLAVICLE EXCISION ;  Surgeon: Thornton Park, MD;  Location: [facility] ORS;  Service: Orthopedics;  Laterality: Left;   Family History  Problem Relation Age of Onset  . Dementia Mother   . Diabetes Father   . Colitis Sister   . Autoimmune disease Brother   . Colitis Daughter    Social History   Socioeconomic History  . Marital status: Married    Spouse name: Not on file  . Number of children: Not on file  . Years of education:  Not on file  . Highest education level: Not on file  Occupational History  . Not on file  Tobacco Use  . Smoking status: Never Smoker  . Smokeless tobacco: Never Used  Vaping Use  . Vaping Use: Never used  Substance and Sexual Activity  . Alcohol use: No    Alcohol/week: 0.0 standard drinks    Comment: occ  . Drug use: No  . Sexual activity: Not on file  Other Topics Concern  . Not on file  Social History Narrative  . Not on file   Social Determinants of Health   Financial Resource Strain: Low Risk   . Difficulty of Paying Living Expenses: Not hard at all  Food Insecurity: No Food Insecurity  . Worried About Charity fundraiser in the Last Year: Never true  . Ran Out of Food in the Last Year: Never true  Transportation Needs: No Transportation Needs  . Lack of Transportation (Medical): No  . Lack of Transportation (Non-Medical): No  Physical Activity: Sufficiently Active  . Days of Exercise per Week: 4 days  . Minutes of Exercise per Session: 150+ min  Stress: No Stress Concern Present  . Feeling of Stress : Not at all  Social Connections: Unknown  . Frequency of Communication with Friends and Family: Not on file  . Frequency of Social Gatherings with Friends and Family: Not on file  . Attends  Religious Services: Not on file  . Active Member of Clubs or Organizations: Not on file  . Attends Archivist Meetings: Not on file  . Marital Status: Married    Outpatient Encounter Medications as of 06/24/2020  Medication Sig  . Omega-3 Fatty Acids (FISH OIL PO) Take 4,400 mg by mouth daily.  . [DISCONTINUED] terbinafine (LAMISIL) 250 MG tablet Take 1 tablet (250 mg total) by mouth daily.   No facility-administered encounter medications on file as of 06/24/2020.    Review of Systems  Constitutional: Negative for appetite change and unexpected weight change.  HENT: Negative for congestion and sinus pressure.   Respiratory: Negative for cough, chest tightness and  shortness of breath.   Cardiovascular: Negative for chest pain, palpitations and leg swelling.  Gastrointestinal: Negative for abdominal pain, diarrhea, nausea and vomiting.  Genitourinary: Negative for difficulty urinating and dysuria.  Musculoskeletal: Negative for joint swelling and myalgias.       Right shoulder pain as outlined.    Skin: Negative for color change.       No rash today.  Describes intermittent rash as outlined.    Neurological: Negative for dizziness, light-headedness and headaches.  Psychiatric/Behavioral: Negative for agitation and dysphoric mood.       Objective:    Physical Exam Vitals reviewed.  Constitutional:      General: He is not in acute distress.    Appearance: Normal appearance. He is well-developed and well-nourished.  HENT:     Head: Normocephalic and atraumatic.     Right Ear: External ear normal.     Left Ear: External ear normal.  Eyes:     General: No scleral icterus.       Right eye: No discharge.        Left eye: No discharge.     Conjunctiva/sclera: Conjunctivae normal.  Cardiovascular:     Rate and Rhythm: Normal rate and regular rhythm.  Pulmonary:     Effort: Pulmonary effort is normal. No respiratory distress.     Breath sounds: Normal breath sounds.  Abdominal:     General: Bowel sounds are normal.     Palpations: Abdomen is soft.     Tenderness: There is no abdominal tenderness.  Musculoskeletal:        General: No swelling, tenderness or edema.     Cervical back: Neck supple. No tenderness.     Comments: Increased pain - right shoulder - at 90 degrees and above.    Lymphadenopathy:     Cervical: No cervical adenopathy.  Skin:    Findings: No erythema or rash.  Neurological:     Mental Status: He is alert.  Psychiatric:        Mood and Affect: Mood and affect and mood normal.        Behavior: Behavior normal.     BP 122/70   Pulse 62   Temp 97.6 F (36.4 C) (Oral)   Resp 16   Ht 5' 8" (1.727 m)   Wt 167 lb  (75.8 kg)   SpO2 98%   BMI 25.39 kg/m  Wt Readings from Last 3 Encounters:  06/24/20 167 lb (75.8 kg)  05/23/20 165 lb (74.8 kg)  01/12/20 165 lb 3.2 oz (74.9 kg)     Lab Results  Component Value Date   WBC 4.2 04/24/2020   HGB 15.2 04/24/2020   HCT 45.2 04/24/2020   PLT 210.0 04/24/2020   GLUCOSE 94 04/24/2020   CHOL 240 (H) 04/24/2020  TRIG 166.0 (H) 04/24/2020   HDL 40.20 04/24/2020   LDLCALC 167 (H) 04/24/2020   ALT 26 04/24/2020   AST 18 04/24/2020   NA 138 04/24/2020   K 4.5 04/24/2020   CL 102 04/24/2020   CREATININE 1.12 04/24/2020   BUN 15 04/24/2020   CO2 30 04/24/2020   TSH 2.53 06/28/2019   PSA 1.62 04/24/2020   INR 1.0 08/30/2017   HGBA1C 6.1 04/24/2020   MICROALBUR <0.7 07/20/2016    MM DIAG BREAST TOMO BILATERAL  Result Date: 02/14/2020 CLINICAL DATA:  69 year old male complaining of a palpable abnormality in the left breast. EXAM: DIGITAL DIAGNOSTIC BILATERAL MAMMOGRAM WITH TOMO AND CAD COMPARISON:  None. ACR Breast Density Category b: There are scattered areas of fibroglandular density. FINDINGS: No suspicious mass or malignant type microcalcifications identified in the right breast. In the 12 o'clock region of the left breast in the area of the palpable abnormality are several benign appearing oil cysts. No suspicious mass or malignant type microcalcifications identified in the left breast. Mammographic images were processed with CAD. IMPRESSION: Benign oil cysts accounts for the palpable abnormality in the left breast. No evidence of malignancy in either breast. RECOMMENDATION: If the clinical exam remains benign/stable no follow-up is needed. I have discussed the findings and recommendations with the patient. If applicable, a reminder letter will be sent to the patient regarding the next appointment. BI-RADS CATEGORY  2: Benign. Electronically Signed   By: Lillia Mountain M.D.   On: 02/14/2020 09:46       Assessment & Plan:   Problem List Items Addressed  This Visit    Hypercholesterolemia    Not on cholesterol medication now.  Low cholesterol diet and exercise.  Follow lipid panel.       Relevant Orders   Hepatic function panel   Lipid panel   TSH   Basic metabolic panel   Anemia    Follow cbc.       Hyperglycemia    Low carb diet and exercise.  Follow met b and a1c.       Relevant Orders   Hemoglobin A1c   History of colonic polyps    Colonoscopy 10/2013.  Recommended f/u in 10 years.        ADHD    Off adderall.  Prefers to remain off medication.  Follow.       Shoulder pain    Persistent right shoulder pain as outlined.  Increased pain at or above 90 degrees.  Refer to ortho.        Relevant Orders   Ambulatory referral to Orthopedic Surgery   Decreased GFR    Continue to stay hydrated.  Avoid antiinflammatories.  Stable.   Lab Results  Component Value Date   CREATININE 1.12 04/24/2020        Rash    Not present now.  Follow.           [name], MD

## 2020-06-30 ENCOUNTER — Encounter: Payer: Self-pay | Admitting: Internal Medicine

## 2020-06-30 DIAGNOSIS — R21 Rash and other nonspecific skin eruption: Secondary | ICD-10-CM | POA: Insufficient documentation

## 2020-06-30 NOTE — Assessment & Plan Note (Signed)
Off adderall.  Prefers to remain off medication.  Follow.

## 2020-06-30 NOTE — Assessment & Plan Note (Signed)
Low carb diet and exercise.  Follow met b and a1c.

## 2020-06-30 NOTE — Assessment & Plan Note (Signed)
Not present now.  Follow.

## 2020-06-30 NOTE — Assessment & Plan Note (Signed)
Persistent right shoulder pain as outlined.  Increased pain at or above 90 degrees.  Refer to ortho.

## 2020-06-30 NOTE — Assessment & Plan Note (Signed)
Not on cholesterol medication now.  Low cholesterol diet and exercise.  Follow lipid panel.  

## 2020-06-30 NOTE — Assessment & Plan Note (Signed)
Colonoscopy 10/2013.  Recommended f/u in 10 years.   

## 2020-06-30 NOTE — Assessment & Plan Note (Signed)
Follow cbc.  

## 2020-06-30 NOTE — Addendum Note (Signed)
Addended by: Alisa Graff on: 06/30/2020 03:18 PM   Modules accepted: Orders

## 2020-06-30 NOTE — Assessment & Plan Note (Signed)
Continue to stay hydrated.  Avoid antiinflammatories.  Stable.   Lab Results  Component Value Date   CREATININE 1.12 04/24/2020

## 2020-07-22 DIAGNOSIS — M7541 Impingement syndrome of right shoulder: Secondary | ICD-10-CM | POA: Diagnosis not present

## 2020-07-26 DIAGNOSIS — M7541 Impingement syndrome of right shoulder: Secondary | ICD-10-CM | POA: Diagnosis not present

## 2020-07-26 DIAGNOSIS — M25511 Pain in right shoulder: Secondary | ICD-10-CM | POA: Diagnosis not present

## 2020-08-09 DIAGNOSIS — M7541 Impingement syndrome of right shoulder: Secondary | ICD-10-CM | POA: Diagnosis not present

## 2020-08-09 DIAGNOSIS — M25511 Pain in right shoulder: Secondary | ICD-10-CM | POA: Diagnosis not present

## 2020-08-16 DIAGNOSIS — M25511 Pain in right shoulder: Secondary | ICD-10-CM | POA: Diagnosis not present

## 2020-08-16 DIAGNOSIS — M7541 Impingement syndrome of right shoulder: Secondary | ICD-10-CM | POA: Diagnosis not present

## 2020-08-26 ENCOUNTER — Other Ambulatory Visit: Payer: Self-pay

## 2020-08-26 ENCOUNTER — Other Ambulatory Visit (INDEPENDENT_AMBULATORY_CARE_PROVIDER_SITE_OTHER): Payer: PPO

## 2020-08-26 DIAGNOSIS — R739 Hyperglycemia, unspecified: Secondary | ICD-10-CM | POA: Diagnosis not present

## 2020-08-26 DIAGNOSIS — E7800 Pure hypercholesterolemia, unspecified: Secondary | ICD-10-CM

## 2020-08-26 LAB — HEPATIC FUNCTION PANEL
ALT: 25 U/L (ref 0–53)
AST: 17 U/L (ref 0–37)
Albumin: 4.1 g/dL (ref 3.5–5.2)
Alkaline Phosphatase: 58 U/L (ref 39–117)
Bilirubin, Direct: 0.1 mg/dL (ref 0.0–0.3)
Total Bilirubin: 0.7 mg/dL (ref 0.2–1.2)
Total Protein: 6.9 g/dL (ref 6.0–8.3)

## 2020-08-26 LAB — BASIC METABOLIC PANEL
BUN: 13 mg/dL (ref 6–23)
CO2: 30 mEq/L (ref 19–32)
Calcium: 9.5 mg/dL (ref 8.4–10.5)
Chloride: 103 mEq/L (ref 96–112)
Creatinine, Ser: 1.21 mg/dL (ref 0.40–1.50)
GFR: 61.25 mL/min (ref >60.00)
Glucose, Bld: 105 mg/dL — ABNORMAL HIGH (ref 70–99)
Potassium: 4.6 mEq/L (ref 3.5–5.1)
Sodium: 139 mEq/L (ref 135–145)

## 2020-08-26 LAB — LIPID PANEL
Cholesterol: 233 mg/dL — ABNORMAL HIGH (ref 0–200)
HDL: 44.5 mg/dL (ref >39.00)
LDL Cholesterol: 161 mg/dL — ABNORMAL HIGH (ref 0–99)
NonHDL: 188.26
Total CHOL/HDL Ratio: 5
Triglycerides: 135 mg/dL (ref 0.0–149.0)
VLDL: 27 mg/dL (ref 0.0–40.0)

## 2020-08-26 LAB — HEMOGLOBIN A1C: Hgb A1c MFr Bld: 6 % (ref 4.6–6.5)

## 2020-08-26 LAB — TSH: TSH: 2.71 u[IU]/mL (ref 0.35–4.50)

## 2020-09-20 ENCOUNTER — Encounter: Payer: Self-pay | Admitting: Internal Medicine

## 2020-09-20 ENCOUNTER — Other Ambulatory Visit: Payer: PPO

## 2020-09-20 ENCOUNTER — Other Ambulatory Visit: Payer: Self-pay

## 2020-09-20 ENCOUNTER — Telehealth (INDEPENDENT_AMBULATORY_CARE_PROVIDER_SITE_OTHER): Payer: PPO | Admitting: Internal Medicine

## 2020-09-20 VITALS — BP 140/80 | Ht 68.0 in | Wt 160.0 lb

## 2020-09-20 DIAGNOSIS — R519 Headache, unspecified: Secondary | ICD-10-CM | POA: Diagnosis not present

## 2020-09-20 DIAGNOSIS — J40 Bronchitis, not specified as acute or chronic: Secondary | ICD-10-CM | POA: Diagnosis not present

## 2020-09-20 DIAGNOSIS — Z20822 Contact with and (suspected) exposure to covid-19: Secondary | ICD-10-CM

## 2020-09-20 DIAGNOSIS — J029 Acute pharyngitis, unspecified: Secondary | ICD-10-CM | POA: Diagnosis not present

## 2020-09-20 MED ORDER — AZITHROMYCIN 250 MG PO TABS: ORAL_TABLET | ORAL | 0 refills | Status: DC

## 2020-09-20 NOTE — Progress Notes (Signed)
States that he has a dry cough for the last 2 weeks. States there was some phlegm and woke up this morning with a splitting headache.   Patient states his caregiver went home sick yesterday and was diagnosed with bronchitis.

## 2020-09-20 NOTE — Patient Instructions (Signed)
If covid  There is no medication other than over the counter meds:  Mucinex dm green label for cough.  Vitamin C 1000 mg daily.  Vitamin D3 4000 Iu (units) daily.  Zinc 100 mg daily.  Quercetin 250-500 mg 2 times per day   Elderberry  Oil of oregano  cepacol or chloroseptic spray  Warm tea with honey and lemon  Hydration  Try to eat though you dont feel like it   Tylenol or Advil  Nasal saline  Flonase     Monitor pulse oximeter, buy from [place] if oxygen is less than 90 please go to the hospital.        Are you feeling really sick? Shortness of breath, cough, chest pain?, dizziness? Confusion   If so let me know  If worsening, go to hospital or [facility] clinic Urgent care for further treatment
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
## 2020-09-21 ENCOUNTER — Encounter: Payer: Self-pay | Admitting: Internal Medicine

## 2020-09-21 LAB — NOVEL CORONAVIRUS, NAA: SARS-CoV-2, NAA: NOT DETECTED

## 2020-09-21 LAB — SARS-COV-2, NAA 2 DAY TAT

## 2020-09-23 NOTE — Telephone Encounter (Signed)
-----   Message from Delorise Jackson, MD sent at 09/22/2020  5:13 PM EDT ----- Covid negative

## 2020-09-24 ENCOUNTER — Encounter: Payer: Self-pay | Admitting: Internal Medicine

## 2020-10-24 ENCOUNTER — Ambulatory Visit (INDEPENDENT_AMBULATORY_CARE_PROVIDER_SITE_OTHER): Payer: PPO | Admitting: Internal Medicine

## 2020-10-24 ENCOUNTER — Ambulatory Visit
Admission: RE | Admit: 2020-10-24 | Discharge: 2020-10-24 | Disposition: A | Discharge status: Home / Self Care | Payer: PPO | Source: Ambulatory Visit | Attending: Internal Medicine | Admitting: Internal Medicine

## 2020-10-24 ENCOUNTER — Other Ambulatory Visit: Payer: Self-pay

## 2020-10-24 ENCOUNTER — Telehealth: Payer: Self-pay

## 2020-10-24 ENCOUNTER — Other Ambulatory Visit: Payer: Self-pay | Admitting: Internal Medicine

## 2020-10-24 DIAGNOSIS — W19XXXA Unspecified fall, initial encounter: Secondary | ICD-10-CM | POA: Insufficient documentation

## 2020-10-24 DIAGNOSIS — R739 Hyperglycemia, unspecified: Secondary | ICD-10-CM

## 2020-10-24 DIAGNOSIS — S0231XA Fracture of orbital floor, right side, initial encounter for closed fracture: Secondary | ICD-10-CM | POA: Insufficient documentation

## 2020-10-24 DIAGNOSIS — S0285XA Fracture of orbit, unspecified, initial encounter for closed fracture: Secondary | ICD-10-CM | POA: Diagnosis not present

## 2020-10-24 DIAGNOSIS — S0993XA Unspecified injury of face, initial encounter: Secondary | ICD-10-CM

## 2020-10-24 DIAGNOSIS — E7800 Pure hypercholesterolemia, unspecified: Secondary | ICD-10-CM

## 2020-10-24 DIAGNOSIS — D649 Anemia, unspecified: Secondary | ICD-10-CM

## 2020-10-24 DIAGNOSIS — M549 Dorsalgia, unspecified: Secondary | ICD-10-CM | POA: Diagnosis not present

## 2020-10-24 DIAGNOSIS — S0591XA Unspecified injury of right eye and orbit, initial encounter: Secondary | ICD-10-CM | POA: Diagnosis not present

## 2020-10-24 NOTE — Progress Notes (Signed)
Patient ID: Elijah Walker, male   DOB: March 13, 1951, 70 y.o.   MRN: [id number]   Subjective:    Patient ID: Elijah Walker, male    DOB: Jan 15, 1951, 70 y.o.   MRN: [id number]  HPI This visit occurred during the SARS-CoV-2 public health emergency.  Safety protocols were in place, including screening questions prior to the visit, additional usage of staff PPE, and extensive cleaning of exam room while observing appropriate contact time as indicated for disinfecting solutions.  Patient here for a scheduled physical, but visit was changed to a follow up visit. He fell three days ago.  Tripped over a fence.  Fell face down.  Hit the front of his face - pain under right eye, bridge of nose and describes a numbness - right side - under right eye extending down to his upper lip.  No headache now.  No dizziness reported.  Persistent pain - especially with palpation.  No vision change reported.  No chest pain or sob reported.  Eating.  Bowels better with diet adjustment.    Past Medical History:  Diagnosis Date  . Blood in stool    H/O  . History of chicken pox   . Hyperlipidemia    Past Surgical History:  Procedure Laterality Date  . CHOLECYSTECTOMY  1998  . COLONOSCOPY    . SHOULDER ARTHROSCOPY WITH BANKART REPAIR Left 09/14/2017   Procedure: SHOULDER ARTHROSCOPY WITH MINI OPEN ROTATOR CUFF REPAIR, SUBACROMINAL DECOMPRESSION, LABRAL REPAIR,BICEP TENOTOMY, DISTAL CLAVICLE EXCISION ;  Surgeon: Thornton Park, MD;  Location: [facility] ORS;  Service: Orthopedics;  Laterality: Left;   Family History  Problem Relation Age of Onset  . Dementia Mother   . Diabetes Father   . Colitis Sister   . Autoimmune disease Brother   . Colitis Daughter    Social History   Socioeconomic History  . Marital status: Married    Spouse name: Not on file  . Number of children: Not on file  . Years of education: Not on file  . Highest education level: Not on file  Occupational History  . Not on file  Tobacco Use  .  Smoking status: Never Smoker  . Smokeless tobacco: Never Used  Vaping Use  . Vaping Use: Never used  Substance and Sexual Activity  . Alcohol use: No    Alcohol/week: 0.0 standard drinks    Comment: occ  . Drug use: No  . Sexual activity: Not on file  Other Topics Concern  . Not on file  Social History Narrative  . Not on file   Social Determinants of Health   Financial Resource Strain: Low Risk   . Difficulty of Paying Living Expenses: Not hard at all  Food Insecurity: No Food Insecurity  . Worried About Charity fundraiser in the Last Year: Never Elijah  . Ran Out of Food in the Last Year: Never Elijah  Transportation Needs: No Transportation Needs  . Lack of Transportation (Medical): No  . Lack of Transportation (Non-Medical): No  Physical Activity: Sufficiently Active  . Days of Exercise per Week: 4 days  . Minutes of Exercise per Session: 150+ min  Stress: No Stress Concern Present  . Feeling of Stress : Not at all  Social Connections: Unknown  . Frequency of Communication with Friends and Family: Not on file  . Frequency of Social Gatherings with Friends and Family: Not on file  . Attends Religious Services: Not on file  . Active Member of Clubs or Organizations: Not on file  .  Attends Archivist Meetings: Not on file  . Marital Status: Married    Outpatient Encounter Medications as of 10/24/2020  Medication Sig  . [DISCONTINUED] azithromycin (ZITHROMAX) 250 MG tablet 2 pills day 1 and 1 pill day 2-5 with food (Patient not taking: Reported on 10/24/2020)  . [DISCONTINUED] Omega-3 Fatty Acids (FISH OIL PO) Take 4,400 mg by mouth daily. (Patient not taking: No sig reported)   No facility-administered encounter medications on file as of 10/24/2020.    Review of Systems  Constitutional: Negative for appetite change and unexpected weight change.  HENT: Negative for congestion and sinus pressure.        Increased pain under right eye - to right upper lip.  Some  numbness as outlined.   Respiratory: Negative for cough, chest tightness and shortness of breath.   Cardiovascular: Negative for chest pain, palpitations and leg swelling.  Gastrointestinal: Negative for abdominal pain, diarrhea, nausea and vomiting.  Genitourinary: Negative for difficulty urinating and dysuria.  Musculoskeletal: Negative for joint swelling and myalgias.  Skin: Negative for color change and rash.  Neurological: Negative for dizziness, light-headedness and headaches.  Psychiatric/Behavioral: Negative for agitation and dysphoric mood.       Objective:    Physical Exam Vitals reviewed.  Constitutional:      General: He is not in acute distress.    Appearance: Normal appearance. He is well-developed.  HENT:     Head: Normocephalic.     Comments: Increased pain - under right eye.  Minimal pain - bridge of nose.  Numbness extending down to upper lip.     Right Ear: Ear canal and external ear normal.     Left Ear: Ear canal and external ear normal.     Mouth/Throat:     Pharynx: Oropharynx is clear. No oropharyngeal exudate or posterior oropharyngeal erythema.  Eyes:     General: No scleral icterus.       Right eye: No discharge.        Left eye: No discharge.     Conjunctiva/sclera: Conjunctivae normal.  Cardiovascular:     Rate and Rhythm: Normal rate and regular rhythm.  Pulmonary:     Effort: Pulmonary effort is normal. No respiratory distress.     Breath sounds: Normal breath sounds.  Abdominal:     General: Bowel sounds are normal.     Palpations: Abdomen is soft.     Tenderness: There is no abdominal tenderness.  Musculoskeletal:        General: No swelling or tenderness.  Skin:    Findings: No erythema or rash.  Neurological:     Mental Status: He is alert.  Psychiatric:        Mood and Affect: Mood normal.        Behavior: Behavior normal.     BP 122/64   Pulse 69   Temp (!) 96.4 F (35.8 C) (Temporal)   Resp 16   Ht '5\' 8"'  (1.727 m)   Wt  167 lb (75.8 kg)   SpO2 99%   BMI 25.39 kg/m  Wt Readings from Last 3 Encounters:  10/24/20 167 lb (75.8 kg)  09/20/20 160 lb (72.6 kg)  06/24/20 167 lb (75.8 kg)     Lab Results  Component Value Date   WBC 4.2 04/24/2020   HGB 15.2 04/24/2020   HCT 45.2 04/24/2020   PLT 210.0 04/24/2020   GLUCOSE 105 (H) 08/26/2020   CHOL 233 (H) 08/26/2020   TRIG 135.0 08/26/2020  HDL 44.50 08/26/2020   LDLCALC 161 (H) 08/26/2020   ALT 25 08/26/2020   AST 17 08/26/2020   NA 139 08/26/2020   K 4.6 08/26/2020   CL 103 08/26/2020   CREATININE 1.21 08/26/2020   BUN 13 08/26/2020   CO2 30 08/26/2020   TSH 2.71 08/26/2020   PSA 1.62 04/24/2020   INR 1.0 08/30/2017   HGBA1C 6.0 08/26/2020   MICROALBUR <0.7 07/20/2016    MM DIAG BREAST TOMO BILATERAL  Result Date: 02/14/2020 CLINICAL DATA:  70 year old male complaining of a palpable abnormality in the left breast. EXAM: DIGITAL DIAGNOSTIC BILATERAL MAMMOGRAM WITH TOMO AND CAD COMPARISON:  None. ACR Breast Density Category b: There are scattered areas of fibroglandular density. FINDINGS: No suspicious mass or malignant type microcalcifications identified in the right breast. In the 12 o'clock region of the left breast in the area of the palpable abnormality are several benign appearing oil cysts. No suspicious mass or malignant type microcalcifications identified in the left breast. Mammographic images were processed with CAD. IMPRESSION: Benign oil cysts accounts for the palpable abnormality in the left breast. No evidence of malignancy in either breast. RECOMMENDATION: If the clinical exam remains benign/stable no follow-up is needed. I have discussed the findings and recommendations with the patient. If applicable, a reminder letter will be sent to the patient regarding the next appointment. BI-RADS CATEGORY  2: Benign. Electronically Signed   By: Lillia Mountain M.D.   On: 02/14/2020 09:46       Assessment & Plan:   Problem List Items Addressed  This Visit    Anemia    Follow cbc.       Facial trauma    Recent fall with persistent pain under right eye and numbness and pain extending down to upper lip.  Given hard fall and persistent pain, will CT to further evaluate.  Concern over possible orbital fracture.       Relevant Orders   CT ORBITS WO CONTRAST (Completed)   CT Head Wo Contrast (Completed)   Fall    Facial trauma - persistent pain and numbness as outlined.  Given persistent pain and hard fall, will obtain CT head and orbits to better evaluate.  Concern over possible orbital fracture.       Relevant Orders   CT ORBITS WO CONTRAST (Completed)   CT Head Wo Contrast (Completed)   Hypercholesterolemia    Not on cholesterol medication now.  Low cholesterol diet and exercise.  Follow lipid panel.       Relevant Orders   Hepatic function panel   Lipid panel   Hyperglycemia    Low carb diet and exercise.  Follow met b and a1c.       Relevant Orders   Hemoglobin O7P   Basic metabolic panel       [name], MD

## 2020-10-24 NOTE — Telephone Encounter (Signed)
Wife called back and patient is on his way to Dr Criss Rosales office

## 2020-10-24 NOTE — Assessment & Plan Note (Addendum)
Recent fall with persistent pain under right eye and numbness and pain extending down to upper lip.  Given hard fall and persistent pain, will CT to further evaluate.  Concern over possible orbital fracture.

## 2020-10-24 NOTE — Progress Notes (Unsigned)
Order placed for ENT referral.   

## 2020-10-24 NOTE — Assessment & Plan Note (Addendum)
Facial trauma - persistent pain and numbness as outlined.  Given persistent pain and hard fall, will obtain CT head and orbits to better evaluate.  Concern over possible orbital fracture.

## 2020-10-26 ENCOUNTER — Encounter: Payer: Self-pay | Admitting: Internal Medicine

## 2020-10-26 NOTE — Assessment & Plan Note (Signed)
Not on cholesterol medication now.  Low cholesterol diet and exercise.  Follow lipid panel.

## 2020-10-26 NOTE — Assessment & Plan Note (Signed)
Follow cbc.  

## 2020-10-26 NOTE — Assessment & Plan Note (Signed)
Low carb diet and exercise.  Follow met b and a1c.  

## 2020-11-01 DIAGNOSIS — M7541 Impingement syndrome of right shoulder: Secondary | ICD-10-CM | POA: Diagnosis not present

## 2020-11-01 DIAGNOSIS — M25511 Pain in right shoulder: Secondary | ICD-10-CM | POA: Diagnosis not present

## 2020-11-27 DIAGNOSIS — M25511 Pain in right shoulder: Secondary | ICD-10-CM | POA: Diagnosis not present

## 2020-12-04 DIAGNOSIS — M25511 Pain in right shoulder: Secondary | ICD-10-CM | POA: Diagnosis not present

## 2020-12-17 ENCOUNTER — Encounter: Payer: Self-pay | Admitting: Internal Medicine

## 2020-12-18 NOTE — Telephone Encounter (Signed)
Pre-op Scheduled and physical moved. Will keep lab appt as is.

## 2020-12-30 ENCOUNTER — Ambulatory Visit (INDEPENDENT_AMBULATORY_CARE_PROVIDER_SITE_OTHER): Payer: PPO | Admitting: Internal Medicine

## 2020-12-30 ENCOUNTER — Encounter: Payer: Self-pay | Admitting: Internal Medicine

## 2020-12-30 ENCOUNTER — Other Ambulatory Visit: Payer: Self-pay

## 2020-12-30 VITALS — BP 128/76 | HR 63 | Temp 97.8°F | Resp 16 | Ht 68.0 in | Wt 167.2 lb

## 2020-12-30 DIAGNOSIS — M25511 Pain in right shoulder: Secondary | ICD-10-CM

## 2020-12-30 DIAGNOSIS — E7800 Pure hypercholesterolemia, unspecified: Secondary | ICD-10-CM

## 2020-12-30 DIAGNOSIS — D649 Anemia, unspecified: Secondary | ICD-10-CM | POA: Diagnosis not present

## 2020-12-30 DIAGNOSIS — Z01818 Encounter for other preprocedural examination: Secondary | ICD-10-CM | POA: Diagnosis not present

## 2020-12-30 DIAGNOSIS — R739 Hyperglycemia, unspecified: Secondary | ICD-10-CM

## 2020-12-30 DIAGNOSIS — R944 Abnormal results of kidney function studies: Secondary | ICD-10-CM | POA: Diagnosis not present

## 2020-12-30 DIAGNOSIS — M25512 Pain in left shoulder: Secondary | ICD-10-CM | POA: Diagnosis not present

## 2020-12-30 LAB — BASIC METABOLIC PANEL
BUN: 16 mg/dL (ref 6–23)
CO2: 29 mEq/L (ref 19–32)
Calcium: 9.7 mg/dL (ref 8.4–10.5)
Chloride: 103 mEq/L (ref 96–112)
Creatinine, Ser: 1.02 mg/dL (ref 0.40–1.50)
GFR: 75 mL/min (ref >60.00)
Glucose, Bld: 74 mg/dL (ref 70–99)
Potassium: 4.1 mEq/L (ref 3.5–5.1)
Sodium: 141 mEq/L (ref 135–145)

## 2020-12-30 LAB — CBC WITH DIFFERENTIAL/PLATELET
Basophils Absolute: 0 10*3/uL (ref 0.0–0.1)
Basophils Relative: 0.5 % (ref 0.0–3.0)
Eosinophils Absolute: 0.1 10*3/uL (ref 0.0–0.7)
Eosinophils Relative: 1.5 % (ref 0.0–5.0)
HCT: 48.2 % (ref 39.0–52.0)
Hemoglobin: 16.1 g/dL (ref 13.0–17.0)
Lymphocytes Relative: 29.1 % (ref 12.0–46.0)
Lymphs Abs: 1.8 10*3/uL (ref 0.7–4.0)
MCHC: 33.3 g/dL (ref 30.0–36.0)
MCV: 96.6 fl (ref 78.0–100.0)
Monocytes Absolute: 0.6 10*3/uL (ref 0.1–1.0)
Monocytes Relative: 9.6 % (ref 3.0–12.0)
Neutro Abs: 3.6 10*3/uL (ref 1.4–7.7)
Neutrophils Relative %: 59.3 % (ref 43.0–77.0)
Platelets: 234 10*3/uL (ref 150.0–400.0)
RBC: 4.99 Mil/uL (ref 4.22–5.81)
RDW: 13.4 % (ref 11.5–15.5)
WBC: 6.1 10*3/uL (ref 4.0–10.5)

## 2020-12-30 NOTE — Progress Notes (Signed)
Patient ID: Elijah Walker, male   DOB: March 11, 1951, 70 y.o.   MRN: [id number]   Subjective:    Patient ID: Elijah Walker, male    DOB: [birth year], 70 y.o.   MRN: [id number]  HPI This visit occurred during the SARS-CoV-2 public health emergency.  Safety protocols were in place, including screening questions prior to the visit, additional usage of staff PPE, and extensive cleaning of exam room while observing appropriate contact time as indicated for disinfecting solutions.   Patient here for work in appt.  Work in for pre op evaluation. Planning to have right shoulder surgery - 01/16/21 - Dr Mack Guise.  He stays physically active.  Denies any chest pain chest tightness or shortness of breath.  Eating and drinking well.  Has adjusted his diet.  Has cut out red meat and butter.  No acid reflux reported.  No abdominal pain.  Bowels moving.  Past Medical History:  Diagnosis Date   Blood in stool    H/O   History of chicken pox    Hyperlipidemia    Past Surgical History:  Procedure Laterality Date   CHOLECYSTECTOMY  1998   COLONOSCOPY     SHOULDER ARTHROSCOPY WITH BANKART REPAIR Left 09/14/2017   Procedure: SHOULDER ARTHROSCOPY WITH MINI OPEN ROTATOR CUFF REPAIR, SUBACROMINAL DECOMPRESSION, LABRAL REPAIR,BICEP TENOTOMY, DISTAL CLAVICLE EXCISION ;  Surgeon: Thornton Park, MD;  Location: [facility];  Service: Orthopedics;  Laterality: Left;   Family History  Problem Relation Age of Onset   Dementia Mother    Diabetes Father    Colitis Sister    Autoimmune disease Brother    Colitis Daughter    Social History   Socioeconomic History   Marital status: Married    Spouse name: Not on file   Number of children: Not on file   Years of education: Not on file   Highest education level: Not on file  Occupational History   Not on file  Tobacco Use   Smoking status: Never   Smokeless tobacco: Never  Vaping Use   Vaping Use: Never used  Substance and Sexual Activity   Alcohol use: No     Alcohol/week: 0.0 standard drinks    Comment: occ   Drug use: No   Sexual activity: Not on file  Other Topics Concern   Not on file  Social History Narrative   Not on file   Social Determinants of Health   Financial Resource Strain: Low Risk    Difficulty of Paying Living Expenses: Not hard at all  Food Insecurity: No Food Insecurity   Worried About Charity fundraiser in the Last Year: Never true   [place] in the Last Year: Never true  Transportation Needs: No Transportation Needs   Lack of Transportation (Medical): No   Lack of Transportation (Non-Medical): No  Physical Activity: Sufficiently Active   Days of Exercise per Week: 4 days   Minutes of Exercise per Session: 150+ min  Stress: No Stress Concern Present   Feeling of Stress : Not at all  Social Connections: Unknown   Frequency of Communication with Friends and Family: Not on file   Frequency of Social Gatherings with Friends and Family: Not on file   Attends Religious Services: Not on file   Active Member of Clubs or Organizations: Not on file   Attends Archivist Meetings: Not on file   Marital Status: Married    No outpatient encounter medications on file as of 12/30/2020.  No facility-administered encounter medications on file as of 12/30/2020.     Review of Systems  Constitutional:  Negative for appetite change and unexpected weight change.  HENT:  Negative for congestion and sinus pressure.   Respiratory:  Negative for cough, chest tightness and shortness of breath.   Cardiovascular:  Negative for chest pain, palpitations and leg swelling.  Gastrointestinal:  Negative for abdominal pain, diarrhea, nausea and vomiting.  Genitourinary:  Negative for difficulty urinating and dysuria.  Musculoskeletal:  Negative for joint swelling and myalgias.       Shoulder pain and limited rom as outlined.   Skin:  Negative for color change and rash.  Neurological:  Negative for dizziness,  light-headedness and headaches.  Psychiatric/Behavioral:  Negative for agitation and dysphoric mood.       Objective:    Physical Exam Vitals reviewed.  Constitutional:      General: He is not in acute distress.    Appearance: Normal appearance. He is well-developed.  HENT:     Head: Normocephalic and atraumatic.     Right Ear: External ear normal.     Left Ear: External ear normal.  Eyes:     General: No scleral icterus.       Right eye: No discharge.        Left eye: No discharge.     Conjunctiva/sclera: Conjunctivae normal.  Cardiovascular:     Rate and Rhythm: Normal rate and regular rhythm.  Pulmonary:     Effort: Pulmonary effort is normal. No respiratory distress.     Breath sounds: Normal breath sounds.  Abdominal:     General: Bowel sounds are normal.     Palpations: Abdomen is soft.     Tenderness: There is no abdominal tenderness.  Musculoskeletal:        General: No swelling or tenderness.     Cervical back: Neck supple. No tenderness.     Comments: Limited rom - right shoulder.   Lymphadenopathy:     Cervical: No cervical adenopathy.  Skin:    Findings: No erythema or rash.  Neurological:     Mental Status: He is alert.  Psychiatric:        Mood and Affect: Mood normal.        Behavior: Behavior normal.    BP 128/76   Pulse 63   Temp 97.8 F (36.6 C)   Resp 16   Ht 5\' 8"  (1.727 m)   Wt 167 lb 3.2 oz (75.8 kg)   SpO2 98%   BMI 25.42 kg/m  Wt Readings from Last 3 Encounters:  12/30/20 167 lb 3.2 oz (75.8 kg)  10/24/20 167 lb (75.8 kg)  09/20/20 160 lb (72.6 kg)     Lab Results  Component Value Date   WBC 6.1 12/30/2020   HGB 16.1 12/30/2020   HCT 48.2 12/30/2020   PLT 234.0 12/30/2020   GLUCOSE 74 12/30/2020   CHOL 233 (H) 08/26/2020   TRIG 135.0 08/26/2020   HDL 44.50 08/26/2020   LDLCALC 161 (H) 08/26/2020   ALT 25 08/26/2020   AST 17 08/26/2020   NA 141 12/30/2020   K 4.1 12/30/2020   CL 103 12/30/2020   CREATININE 1.02  12/30/2020   BUN 16 12/30/2020   CO2 29 12/30/2020   TSH 2.71 08/26/2020   PSA 1.62 04/24/2020   INR 1.0 08/30/2017   HGBA1C 6.0 08/26/2020   MICROALBUR <0.7 07/20/2016    CT Head Wo Contrast  Result Date: 10/24/2020 CLINICAL DATA:  70 year old male status post fall 3 days ago. Trauma to the right eye. EXAM: CT HEAD WITHOUT CONTRAST TECHNIQUE: Contiguous axial images were obtained from the base of the skull through the vertex without intravenous contrast. COMPARISON:  Orbit CT today reported separately. FINDINGS: Brain: Normal cerebral volume. No midline shift, ventriculomegaly, mass effect, evidence of mass lesion, intracranial hemorrhage or evidence of cortically based acute infarction. Gray-white matter differentiation is within normal limits throughout the brain. Vascular: Minimal Calcified atherosclerosis at the skull base. No suspicious intracranial vascular hyperdensity. Skull: No fracture identified. Bone mineralization is within normal limits. Sinuses/Orbits: Visualized paranasal sinuses and mastoids are clear. Other: Visualized scalp soft tissues are within normal limits. Orbits are detailed separately today. IMPRESSION: 1. Normal noncontrast CT appearance of the brain. No acute traumatic injury identified. 2. Orbit CT today reported separately. Electronically Signed   By: Genevie Ann M.D.   On: 10/24/2020 10:58   CT ORBITS WO CONTRAST  Result Date: 10/24/2020 CLINICAL DATA:  69 year old male status post fall 3 days ago. Trauma to the right eye. EXAM: CT ORBITS WITHOUT CONTRAST TECHNIQUE: Multidetector CT images were obtained using the standard protocol without intravenous contrast. COMPARISON:  Head CT today reported separately. FINDINGS: Orbits: Right orbital floor fracture with mild herniation of intraorbital fat (series 8, image 32). Inferior rectus muscle unaffected. No intraorbital hematoma. Intact globes. Other bilateral orbital walls are intact. Other osseous structures: No acute  osseous abnormality identified. Nasal bones appear intact. 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 Visualized sinuses: Minor fluid in the right maxillary sinus. Other paranasal sinuses and mastoids are clear. Soft tissues: Negative visible noncontrast deep soft tissue spaces of the face. Limited intracranial: Negative. IMPRESSION: 1. Right orbital floor fracture with mild herniation of intraorbital fat. 2. No intraorbital hematoma or other acute traumatic injury identified. Electronically Signed   By: Genevie Ann M.D.   On: 10/24/2020 11:02       Assessment & Plan:   Problem List Items Addressed This Visit     Anemia    Follow cbc.        Decreased GFR    Continue to stay hydrated.  Avoid anti-inflammatories.  Recheck metabolic panel.       Hypercholesterolemia    On no cholesterol medication now.  Has adjusted his diet.  Follow cholesterol.        Hyperglycemia    Low-carb diet and exercise.  Follow [facility]igh and A1c.       Pre-op evaluation - Primary    He is very active with no cardiac symptoms.  EKG obtained today revealed sinus rhythm with ventricular rate of 58.  No acute ischemic changes noted.  No chest pain or shortness of breath with activity.  Given he is asymptomatic with increased activity and given EKG findings I do feel he is at low risk from a cardiac standpoint to proceed with the planned surgery.  He will need close Intra-Op and postop monitoring of his heart rate and blood pressure to avoid extremes.       Relevant Orders   EKG 12-Lead (Completed)   CBC with Differential/Platelet (Completed)   Basic metabolic panel (Completed)   Shoulder pain    Persistent right shoulder pain.  Seeing ortho.  Planning for shoulder surgery 01/16/21.           [name], MD

## 2021-01-05 ENCOUNTER — Encounter: Payer: Self-pay | Admitting: Internal Medicine

## 2021-01-05 NOTE — Assessment & Plan Note (Signed)
Low-carb diet and exercise.  Follow [facility]igh and A1c. 

## 2021-01-05 NOTE — Assessment & Plan Note (Signed)
Continue to stay hydrated.  Avoid anti-inflammatories.  Recheck metabolic panel.

## 2021-01-05 NOTE — Assessment & Plan Note (Signed)
Persistent right shoulder pain.  Seeing ortho.  Planning for shoulder surgery 01/16/21.

## 2021-01-05 NOTE — Assessment & Plan Note (Signed)
Follow cbc.  

## 2021-01-05 NOTE — Assessment & Plan Note (Addendum)
On no cholesterol medication now.  Has adjusted his diet.  Follow cholesterol.

## 2021-01-05 NOTE — Assessment & Plan Note (Signed)
He is very active with no cardiac symptoms.  EKG obtained today revealed sinus rhythm with ventricular rate of 58.  No acute ischemic changes noted.  No chest pain or shortness of breath with activity.  Given he is asymptomatic with increased activity and given EKG findings I do feel he is at low risk from a cardiac standpoint to proceed with the planned surgery.  He will need close Intra-Op and postop monitoring of his heart rate and blood pressure to avoid extremes.

## 2021-01-06 ENCOUNTER — Other Ambulatory Visit: Payer: Self-pay

## 2021-01-06 ENCOUNTER — Other Ambulatory Visit: Payer: Self-pay | Admitting: Orthopedic Surgery

## 2021-01-06 ENCOUNTER — Encounter
Admission: RE | Admit: 2021-01-06 | Discharge: 2021-01-06 | Disposition: A | Discharge status: Home / Self Care | Payer: PPO | Source: Ambulatory Visit | Attending: Orthopedic Surgery | Admitting: Orthopedic Surgery

## 2021-01-06 NOTE — Patient Instructions (Addendum)
Your procedure is scheduled on: 01/16/21 Report to [place]. To find out your arrival time please call [phone] between 1PM - 3PM on 01/15/21.  Remember: Instructions that are not followed completely may result in serious medical risk, up to and including death, or upon the discretion of your surgeon and anesthesiologist your surgery may need to be rescheduled.     _X__ 1. Do not eat food or drink any liquids after midnight the night before your procedure.                 No gum chewing or hard candies.   __X__2.  On the morning of surgery brush your teeth with toothpaste and water, you                 may rinse your mouth with mouthwash if you wish.  Do not swallow any              toothpaste of mouthwash.     _X__ 3.  No Alcohol for 24 hours before or after surgery.   _X__ 4.  Do Not Smoke or use e-cigarettes For 24 Hours Prior to Your Surgery.                 Do not use any chewable tobacco products for at least 6 hours prior to                 surgery.  ____  5.  Bring all medications with you on the day of surgery if instructed.   __X__  6.  Notify your doctor if there is any change in your medical condition      (cold, fever, infections).     Do not wear jewelry, make-up, hairpins, clips or nail polish. Do not wear lotions, powders, or perfumes. NO DEODORANT Do not shave body hair 48 hours prior to surgery. Men may shave face and neck. Do not bring valuables to the hospital.    [facility] is not responsible for any belongings or valuables.  Contacts, dentures/partials or body piercings may not be worn into surgery. Bring a case for your contacts, glasses or hearing aids, a denture cup will be supplied. Leave your suitcase in the car. After surgery it may be brought to your room. For patients admitted to the hospital, discharge time is determined by your treatment team.   Patients discharged the day of surgery will not  be allowed to drive home.   Please read over the following fact sheets that you were given:     __X__ Take these medicines the morning of surgery with A SIP OF WATER:    1. NONE  2.   3.   4.  5.  6.  ____ Fleet Enema (as directed)   __X__ Use CHG Soap/SAGE wipes as directed You may come pick this up at our office or use DIAL soap the night before and the morning of your surgery  ____ Use inhalers on the day of surgery  ____ Stop metformin/Janumet/Farxiga 2 days prior to surgery    ____ Take 1/2 of usual insulin dose the night before surgery. No insulin the morning          of surgery.   ____ Stop Blood Thinners Coumadin/Plavix/Xarelto/Pleta/Pradaxa/Eliquis/Effient/Aspirin  on   Or contact your Surgeon, Cardiologist or Medical Doctor regarding  ability to stop your blood thinners  __X__ Stop Anti-inflammatories 7 days before surgery such as Advil,  Ibuprofen, Motrin,  BC or Goodies Powder, Naprosyn, Naproxen, Aleve, Aspirin    __X__ Stop all herbal supplements, fish oil or vitamin E until after surgery.    ____ Bring C-Pap to the hospital.

## 2021-01-06 NOTE — Patient Instructions (Signed)
Your procedure is scheduled on: 01/16/21 Report to [place]. To find out your arrival time please call [phone] between 1PM - 3PM on 01/15/21.  Remember: Instructions that are not followed completely may result in serious medical risk, up to and including death, or upon the discretion of your surgeon and anesthesiologist your surgery may need to be rescheduled.     _X__ 1. Do not eat food or drink any liquids after midnight the night before your procedure.                 No gum chewing or hard candies.   __X__2.  On the morning of surgery brush your teeth with toothpaste and water, you                 may rinse your mouth with mouthwash if you wish.  Do not swallow any              toothpaste of mouthwash.     _X__ 3.  No Alcohol for 24 hours before or after surgery.   _X__ 4.  Do Not Smoke or use e-cigarettes For 24 Hours Prior to Your Surgery.                 Do not use any chewable tobacco products for at least 6 hours prior to                 surgery.  ____  5.  Bring all medications with you on the day of surgery if instructed.   __X__  6.  Notify your doctor if there is any change in your medical condition      (cold, fever, infections).     Do not wear jewelry, make-up, hairpins, clips or nail polish. Do not wear lotions, powders, or perfumes. NO DEODORANT Do not shave BODY HAIR 48 hours prior to surgery. Men may shave face and neck. Do not bring valuables to the hospital.    [facility] is not responsible for any belongings or valuables.  Contacts, dentures/partials or body piercings may not be worn into surgery. Bring a case for your contacts, glasses or hearing aids, a denture cup will be supplied. Leave your suitcase in the car. After surgery it may be brought to your room. For patients admitted to the hospital, discharge time is determined by your treatment team.   Patients discharged the day of surgery will not  be allowed to drive home.   Please read over the following fact sheets that you were given:     __X__ Take these medicines the morning of surgery with A SIP OF WATER:    1. NONE  2.   3.   4.  5.  6.  ____ Fleet Enema (as directed)   __X__ Use CHG Soap/SAGE wipes as directed  You can come pick up the CHG or use DIAL the night before and the morning of surgery  ____ Use inhalers on the day of surgery  ____ Stop metformin/Janumet/Farxiga 2 days prior to surgery    ____ Take 1/2 of usual insulin dose the night before surgery. No insulin the morning          of surgery.   ____ Stop Blood Thinners Coumadin/Plavix/Xarelto/Pleta/Pradaxa/Eliquis/Effient/Aspirin  on   Or contact your Surgeon, Cardiologist or Medical Doctor regarding  ability to stop your blood thinners  __X__ Stop Anti-inflammatories 7 days before surgery such as Advil, Ibuprofen, Motrin,  BC or Goodies Powder, Naprosyn, Naproxen, Aleve, Aspirin    __X__ Stop all herbal supplements, fish oil or vitamin E until after surgery.    ____ Bring C-Pap to the hospital.

## 2021-01-16 ENCOUNTER — Ambulatory Visit
Admission: RE | Admit: 2021-01-16 | Discharge: 2021-01-16 | Disposition: A | Discharge status: Home / Self Care | Payer: PPO | Attending: Orthopedic Surgery | Admitting: Orthopedic Surgery

## 2021-01-16 ENCOUNTER — Encounter
Admission: RE | Disposition: A | Discharge status: Home / Self Care | Payer: Self-pay | Source: Home / Self Care | Attending: Orthopedic Surgery

## 2021-01-16 ENCOUNTER — Encounter: Payer: Self-pay | Admitting: Orthopedic Surgery

## 2021-01-16 ENCOUNTER — Ambulatory Visit: Payer: PPO | Admitting: Anesthesiology

## 2021-01-16 ENCOUNTER — Other Ambulatory Visit: Payer: Self-pay

## 2021-01-16 ENCOUNTER — Ambulatory Visit: Payer: PPO

## 2021-01-16 DIAGNOSIS — Z419 Encounter for procedure for purposes other than remedying health state, unspecified: Secondary | ICD-10-CM

## 2021-01-16 DIAGNOSIS — M19011 Primary osteoarthritis, right shoulder: Secondary | ICD-10-CM | POA: Diagnosis not present

## 2021-01-16 DIAGNOSIS — W19XXXA Unspecified fall, initial encounter: Secondary | ICD-10-CM | POA: Insufficient documentation

## 2021-01-16 DIAGNOSIS — M25811 Other specified joint disorders, right shoulder: Secondary | ICD-10-CM | POA: Insufficient documentation

## 2021-01-16 DIAGNOSIS — S46111A Strain of muscle, fascia and tendon of long head of biceps, right arm, initial encounter: Secondary | ICD-10-CM | POA: Diagnosis not present

## 2021-01-16 DIAGNOSIS — S46011A Strain of muscle(s) and tendon(s) of the rotator cuff of right shoulder, initial encounter: Secondary | ICD-10-CM | POA: Diagnosis not present

## 2021-01-16 DIAGNOSIS — S43431A Superior glenoid labrum lesion of right shoulder, initial encounter: Secondary | ICD-10-CM | POA: Diagnosis not present

## 2021-01-16 DIAGNOSIS — M25511 Pain in right shoulder: Secondary | ICD-10-CM | POA: Diagnosis not present

## 2021-01-16 DIAGNOSIS — M75101 Unspecified rotator cuff tear or rupture of right shoulder, not specified as traumatic: Secondary | ICD-10-CM | POA: Diagnosis not present

## 2021-01-16 DIAGNOSIS — M7541 Impingement syndrome of right shoulder: Secondary | ICD-10-CM | POA: Diagnosis not present

## 2021-01-16 DIAGNOSIS — M75121 Complete rotator cuff tear or rupture of right shoulder, not specified as traumatic: Secondary | ICD-10-CM | POA: Diagnosis not present

## 2021-01-16 DIAGNOSIS — G8918 Other acute postprocedural pain: Secondary | ICD-10-CM | POA: Diagnosis not present

## 2021-01-16 HISTORY — DX: Other specified postprocedural states: Z98.890

## 2021-01-16 HISTORY — PX: SHOULDER ARTHROSCOPY WITH OPEN ROTATOR CUFF REPAIR AND DISTAL CLAVICLE ACROMINECTOMY: SHX5683

## 2021-01-16 HISTORY — DX: Nausea with vomiting, unspecified: R11.2

## 2021-01-16 SURGERY — SHOULDER ARTHROSCOPY WITH OPEN ROTATOR CUFF REPAIR AND DISTAL CLAVICLE ACROMINECTOMY
Anesthesia: General | Laterality: Right

## 2021-01-16 MED ORDER — FAMOTIDINE 20 MG PO TABS: 20.0000 mg | ORAL_TABLET | Freq: Once | ORAL | Status: AC

## 2021-01-16 MED ORDER — CEFAZOLIN SODIUM-DEXTROSE 2-4 GM/100ML-% IV SOLN
INTRAVENOUS | Status: AC
  Filled 2021-01-16: qty 100

## 2021-01-16 MED ORDER — MIDAZOLAM HCL 2 MG/2ML IJ SOLN
INTRAMUSCULAR | Status: AC
  Filled 2021-01-16: qty 2

## 2021-01-16 MED ORDER — ORAL CARE MOUTH RINSE: 15.0000 mL | Freq: Once | OROMUCOSAL | Status: AC

## 2021-01-16 MED ORDER — CHLORHEXIDINE GLUCONATE CLOTH 2 % EX PADS
6.0000 | MEDICATED_PAD | Freq: Once | CUTANEOUS | Status: AC
  Administered 2021-01-16: 6 via TOPICAL

## 2021-01-16 MED ORDER — ONDANSETRON HCL 4 MG/2ML IJ SOLN
4.0000 mg | Freq: Once | INTRAMUSCULAR | Status: DC | PRN
Start: 2021-01-16 — End: 2021-01-16

## 2021-01-16 MED ORDER — ACETAMINOPHEN 500 MG PO TABS: 1000.0000 mg | ORAL_TABLET | ORAL | Status: AC

## 2021-01-16 MED ORDER — LACTATED RINGERS IR SOLN
Status: DC | PRN
  Administered 2021-01-16: 12000 mL

## 2021-01-16 MED ORDER — PROPOFOL 10 MG/ML IV BOLUS
INTRAVENOUS | Status: DC | PRN
  Administered 2021-01-16: 130 mg via INTRAVENOUS

## 2021-01-16 MED ORDER — CHLORHEXIDINE GLUCONATE CLOTH 2 % EX PADS: 6.0000 | MEDICATED_PAD | Freq: Once | CUTANEOUS | Status: DC

## 2021-01-16 MED ORDER — OXYCODONE HCL 5 MG PO TABS: 5.0000 mg | ORAL_TABLET | ORAL | 0 refills | Status: DC | PRN

## 2021-01-16 MED ORDER — BUPIVACAINE LIPOSOME 1.3 % IJ SUSP
INTRAMUSCULAR | Status: AC
  Filled 2021-01-16: qty 20

## 2021-01-16 MED ORDER — BUPIVACAINE HCL (PF) 0.5 % IJ SOLN
INTRAMUSCULAR | Status: DC | PRN
  Administered 2021-01-16: 10 mL

## 2021-01-16 MED ORDER — SUGAMMADEX SODIUM 200 MG/2ML IV SOLN
INTRAVENOUS | Status: DC | PRN
  Administered 2021-01-16: 145.2 mg via INTRAVENOUS

## 2021-01-16 MED ORDER — PHENYLEPHRINE HCL (PRESSORS) 10 MG/ML IV SOLN
INTRAVENOUS | Status: AC
  Filled 2021-01-16: qty 1

## 2021-01-16 MED ORDER — FENTANYL CITRATE (PF) 100 MCG/2ML IJ SOLN: 25.0000 ug | INTRAMUSCULAR | Status: DC | PRN

## 2021-01-16 MED ORDER — PROPOFOL 500 MG/50ML IV EMUL
INTRAVENOUS | Status: DC | PRN
  Administered 2021-01-16: 150 ug/kg/min via INTRAVENOUS

## 2021-01-16 MED ORDER — LACTATED RINGERS IV SOLN: INTRAVENOUS | Status: DC

## 2021-01-16 MED ORDER — SODIUM CHLORIDE 0.9 % IR SOLN
Status: DC | PRN
  Administered 2021-01-16: 12000 mL

## 2021-01-16 MED ORDER — FAMOTIDINE 20 MG PO TABS
ORAL_TABLET | ORAL | Status: AC
  Administered 2021-01-16: 20 mg via ORAL
  Filled 2021-01-16: qty 1

## 2021-01-16 MED ORDER — ACETAMINOPHEN 10 MG/ML IV SOLN: 1000.0000 mg | Freq: Once | INTRAVENOUS | Status: DC | PRN

## 2021-01-16 MED ORDER — PROPOFOL 1000 MG/100ML IV EMUL
INTRAVENOUS | Status: AC
  Filled 2021-01-16: qty 100

## 2021-01-16 MED ORDER — GLYCOPYRROLATE 0.2 MG/ML IJ SOLN
INTRAMUSCULAR | Status: DC | PRN
  Administered 2021-01-16 (×2): .2 mg via INTRAVENOUS

## 2021-01-16 MED ORDER — DEXAMETHASONE SODIUM PHOSPHATE 10 MG/ML IJ SOLN
INTRAMUSCULAR | Status: DC | PRN
  Administered 2021-01-16: 8 mg via INTRAVENOUS

## 2021-01-16 MED ORDER — FENTANYL CITRATE (PF) 100 MCG/2ML IJ SOLN
INTRAMUSCULAR | Status: DC | PRN
  Administered 2021-01-16: 50 ug via INTRAVENOUS

## 2021-01-16 MED ORDER — FENTANYL CITRATE (PF) 100 MCG/2ML IJ SOLN
INTRAMUSCULAR | Status: AC
  Filled 2021-01-16: qty 2

## 2021-01-16 MED ORDER — BUPIVACAINE LIPOSOME 1.3 % IJ SUSP
INTRAMUSCULAR | Status: DC | PRN
  Administered 2021-01-16: 10 mL

## 2021-01-16 MED ORDER — ONDANSETRON HCL 4 MG/2ML IJ SOLN
INTRAMUSCULAR | Status: DC | PRN
  Administered 2021-01-16 (×2): 4 mg via INTRAVENOUS

## 2021-01-16 MED ORDER — NEOMYCIN-POLYMYXIN B GU 40-200000 IR SOLN
Status: DC | PRN
  Administered 2021-01-16: 2 mL

## 2021-01-16 MED ORDER — LIDOCAINE HCL (CARDIAC) PF 100 MG/5ML IV SOSY
PREFILLED_SYRINGE | INTRAVENOUS | Status: DC | PRN
  Administered 2021-01-16: 80 mg via INTRAVENOUS

## 2021-01-16 MED ORDER — PROPOFOL 10 MG/ML IV BOLUS
INTRAVENOUS | Status: AC
  Filled 2021-01-16: qty 20

## 2021-01-16 MED ORDER — FENTANYL CITRATE (PF) 100 MCG/2ML IJ SOLN
INTRAMUSCULAR | Status: AC
  Administered 2021-01-16: 50 ug via INTRAVENOUS
  Filled 2021-01-16: qty 2

## 2021-01-16 MED ORDER — MIDAZOLAM HCL 2 MG/2ML IJ SOLN: 1.0000 mg | INTRAMUSCULAR | Status: DC | PRN

## 2021-01-16 MED ORDER — OXYCODONE HCL 5 MG PO TABS: 5.0000 mg | ORAL_TABLET | Freq: Once | ORAL | Status: DC | PRN

## 2021-01-16 MED ORDER — CHLORHEXIDINE GLUCONATE 0.12 % MT SOLN: 15.0000 mL | Freq: Once | OROMUCOSAL | Status: AC

## 2021-01-16 MED ORDER — CEFAZOLIN SODIUM-DEXTROSE 2-4 GM/100ML-% IV SOLN
2.0000 g | INTRAVENOUS | Status: AC
  Administered 2021-01-16: 2 g via INTRAVENOUS

## 2021-01-16 MED ORDER — FENTANYL CITRATE (PF) 100 MCG/2ML IJ SOLN: 50.0000 ug | INTRAMUSCULAR | Status: DC | PRN

## 2021-01-16 MED ORDER — 0.9 % SODIUM CHLORIDE (POUR BTL) OPTIME
TOPICAL | Status: DC | PRN
  Administered 2021-01-16: 260 mL

## 2021-01-16 MED ORDER — CHLORHEXIDINE GLUCONATE 0.12 % MT SOLN
OROMUCOSAL | Status: AC
  Administered 2021-01-16: 15 mL via OROMUCOSAL
  Filled 2021-01-16: qty 15

## 2021-01-16 MED ORDER — ONDANSETRON HCL 4 MG PO TABS: 4.0000 mg | ORAL_TABLET | Freq: Three times a day (TID) | ORAL | 0 refills | Status: DC | PRN

## 2021-01-16 MED ORDER — OXYCODONE HCL 5 MG/5ML PO SOLN: 5.0000 mg | Freq: Once | ORAL | Status: DC | PRN

## 2021-01-16 MED ORDER — MIDAZOLAM HCL 2 MG/2ML IJ SOLN
INTRAMUSCULAR | Status: AC
  Administered 2021-01-16: 1 mg via INTRAVENOUS
  Filled 2021-01-16: qty 2

## 2021-01-16 MED ORDER — BUPIVACAINE HCL (PF) 0.5 % IJ SOLN
INTRAMUSCULAR | Status: AC
  Filled 2021-01-16: qty 10

## 2021-01-16 MED ORDER — ACETAMINOPHEN 500 MG PO TABS
ORAL_TABLET | ORAL | Status: AC
  Administered 2021-01-16: 1000 mg via ORAL
  Filled 2021-01-16: qty 2

## 2021-01-16 MED ORDER — ROCURONIUM BROMIDE 100 MG/10ML IV SOLN
INTRAVENOUS | Status: DC | PRN
  Administered 2021-01-16: 60 mg via INTRAVENOUS
  Administered 2021-01-16: 20 mg via INTRAVENOUS

## 2021-01-16 MED ORDER — SODIUM CHLORIDE 0.9 % IV SOLN
INTRAVENOUS | Status: DC | PRN
  Administered 2021-01-16: 30 ug/min via INTRAVENOUS

## 2021-01-16 SURGICAL SUPPLY — 73 items
ADAPTER IRRIG TUBE 2 SPIKE SOL (ADAPTER) ×4 IMPLANT
ADPR TBG 2 SPK PMP STRL ASCP (ADAPTER) ×2
ANCH SUT 2 SUTTK 14.5X3 (Anchor) ×3 IMPLANT
ANCH SUT 5.5 KNTLS PEEK (Orthopedic Implant) ×3 IMPLANT
ANCH SUT Q-FX 2.8 (Anchor) ×2 IMPLANT
ANCHOR ALL-SUT Q-FIX 2.8 (Anchor) ×4 IMPLANT
ANCHOR SUT 5.5 MULTIFIX (Orthopedic Implant) ×6 IMPLANT
ANCHOR SUT BIOC ST 3X145 (Anchor) ×6 IMPLANT
BUR RADIUS 4.0X18.5 (BURR) ×2 IMPLANT
BUR RADIUS 5.5 (BURR) ×2 IMPLANT
CANISTER SUCT LVC 12 LTR MEDI- (MISCELLANEOUS) ×2 IMPLANT
CANNULA 5.75X7 CRYSTAL CLEAR (CANNULA) ×4 IMPLANT
CANNULA PARTIAL THREAD 2X7 (CANNULA) ×4 IMPLANT
CANNULA TWIST IN 8.25X9CM (CANNULA) ×4 IMPLANT
CONNECTOR PERFECT PASSER (CONNECTOR) ×2 IMPLANT
COOLER POLAR GLACIER W/PUMP (MISCELLANEOUS) ×2 IMPLANT
DEVICE SUCT BLK HOLE OR FLOOR (MISCELLANEOUS) ×4 IMPLANT
DRAPE 3/4 80X56 (DRAPES) ×2 IMPLANT
DRAPE IMP U-DRAPE 54X76 (DRAPES) ×4 IMPLANT
DRAPE INCISE IOBAN 66X45 STRL (DRAPES) ×2 IMPLANT
DRAPE U-SHAPE 47X51 STRL (DRAPES) ×2 IMPLANT
DURAPREP 26ML APPLICATOR (WOUND CARE) ×6 IMPLANT
ELECT REM PT RETURN 9FT ADLT (ELECTROSURGICAL) ×2
ELECTRODE REM PT RTRN 9FT ADLT (ELECTROSURGICAL) ×1 IMPLANT
GAUZE SPONGE 4X4 12PLY STRL (GAUZE/BANDAGES/DRESSINGS) ×2 IMPLANT
GAUZE XEROFORM 1X8 LF (GAUZE/BANDAGES/DRESSINGS) ×2 IMPLANT
GLOVE SURG ORTHO LTX SZ9 (GLOVE) ×6 IMPLANT
GLOVE SURG UNDER POLY LF SZ9 (GLOVE) ×2 IMPLANT
GOWN STRL REUS TWL 2XL XL LVL4 (GOWN DISPOSABLE) ×2 IMPLANT
GOWN STRL REUS W/ TWL LRG LVL3 (GOWN DISPOSABLE) ×1 IMPLANT
GOWN STRL REUS W/TWL LRG LVL3 (GOWN DISPOSABLE) ×2
IV LACTATED RINGER IRRG 3000ML (IV SOLUTION) ×18
IV LR IRRIG 3000ML ARTHROMATIC (IV SOLUTION) ×9 IMPLANT
KIT STABILIZATION SHOULDER (MISCELLANEOUS) ×2 IMPLANT
KIT SUTURE 2.8 Q-FIX DISP (MISCELLANEOUS) ×2 IMPLANT
KIT SUTURETAK 3.0 INSERT PERC (KITS) ×2 IMPLANT
KIT TURNOVER KIT A (KITS) ×2 IMPLANT
MANIFOLD NEPTUNE II (INSTRUMENTS) ×2 IMPLANT
MASK FACE SPIDER DISP (MASK) ×2 IMPLANT
MAT ABSORB  FLUID 56X50 GRAY (MISCELLANEOUS) ×2
MAT ABSORB FLUID 56X50 GRAY (MISCELLANEOUS) ×2 IMPLANT
NDL SAFETY ECLIPSE 18X1.5 (NEEDLE) ×1 IMPLANT
NEEDLE HYPO 18GX1.5 SHARP (NEEDLE) ×2
NEEDLE HYPO 22GX1.5 SAFETY (NEEDLE) ×2 IMPLANT
NS IRRIG 500ML POUR BTL (IV SOLUTION) ×2 IMPLANT
PACK ARTHROSCOPY SHOULDER (MISCELLANEOUS) ×2 IMPLANT
PAD ARMBOARD 7.5X6 YLW CONV (MISCELLANEOUS) ×4 IMPLANT
PAD WRAPON POLAR SHDR XLG (MISCELLANEOUS) ×1 IMPLANT
PASSER SUT FIRSTPASS SELF (INSTRUMENTS) ×2 IMPLANT
SET TUBE SUCT SHAVER OUTFL 24K (TUBING) ×2 IMPLANT
SET TUBE TIP INTRA-ARTICULAR (MISCELLANEOUS) ×2 IMPLANT
SLING ULTRA II M (MISCELLANEOUS) ×2 IMPLANT
SPONGE T-LAP 18X18 ~~LOC~~+RFID (SPONGE) ×2 IMPLANT
STRIP CLOSURE SKIN 1/2X4 (GAUZE/BANDAGES/DRESSINGS) ×2 IMPLANT
SUT ETHILON 4-0 (SUTURE) ×2
SUT ETHILON 4-0 FS2 18XMFL BLK (SUTURE) ×1
SUT LASSO 90 DEG SD STR (SUTURE) ×2 IMPLANT
SUT MNCRL 4-0 (SUTURE) ×2
SUT MNCRL 4-0 27XMFL (SUTURE) ×1
SUT PDS AB 0 CT1 27 (SUTURE) ×6 IMPLANT
SUT PERFECTPASSER WHITE CART (SUTURE) ×6 IMPLANT
SUT SMART STITCH CARTRIDGE (SUTURE) ×6 IMPLANT
SUT ULTRABRAID 2 COBRAID 38 (SUTURE) IMPLANT
SUT VIC AB 0 CT1 36 (SUTURE) ×6 IMPLANT
SUT VIC AB 2-0 CT2 27 (SUTURE) ×2 IMPLANT
SUTURE ETHLN 4-0 FS2 18XMF BLK (SUTURE) ×1 IMPLANT
SUTURE MNCRL 4-0 27XMF (SUTURE) ×1 IMPLANT
SYR 10ML LL (SYRINGE) ×2 IMPLANT
TAPE MICROFOAM 4IN (TAPE) ×2 IMPLANT
TUBING ARTHRO INFLOW-ONLY STRL (TUBING) ×2 IMPLANT
TUBING CONNECTING 10 (TUBING) ×2 IMPLANT
WAND WEREWOLF FLOW 90D (MISCELLANEOUS) ×2 IMPLANT
WRAPON POLAR PAD SHDR XLG (MISCELLANEOUS) ×2

## 2021-01-16 NOTE — Anesthesia Procedure Notes (Signed)
Anesthesia Regional Block: Interscalene brachial plexus block   Pre-Anesthetic Checklist: , timeout performed,  Correct Patient, Correct Site, Correct Laterality,  Correct Procedure, Correct Position, site marked,  Risks and benefits discussed,  Surgical consent,  Pre-op evaluation,  At surgeon's request and post-op pain management  Laterality: Right  Prep: chloraprep       Needles:  Injection technique: Single-shot  Needle Type: Echogenic Needle     Needle Length: 4cm  Needle Gauge: 25     Additional Needles:   Narrative:  Start time: 01/16/2021 11:18 AM End time: 01/16/2021 11:20 AM Injection made incrementally with aspirations every 5 mL.  Performed by: Personally  Anesthesiologist: Arita Miss, MD  Additional Notes: Patient's chart reviewed and they were deemed appropriate candidate for procedure, at surgeon's request. Patient educated about risks, benefits, and alternatives of the block including but not limited to: temporary or permanent nerve damage, bleeding, infection, damage to surround tissues, pneumothorax, hemidiaphragmatic paralysis, unilateral Horner's syndrome, block failure, local anesthetic toxicity. Patient expressed understanding. A formal time-out was conducted consistent with institution rules.  Monitors were applied, and minimal sedation used (see nursing record). The site was prepped with skin prep and allowed to dry, and sterile gloves were used. A high frequency linear ultrasound probe with probe cover was utilized throughout. C5-7 nerve roots located and appeared anatomically normal, local anesthetic injected around them, and echogenic block needle trajectory was monitored throughout. Aspiration performed every 72ml. Lung and blood vessels were avoided. All injections were performed without resistance and free of blood and paresthesias. The patient tolerated the procedure well.  Injectate: 60ml exparel + 68ml 0.5% bupivacaine

## 2021-01-16 NOTE — Anesthesia Procedure Notes (Signed)
Procedure Name: Intubation Date/Time: 01/16/2021 11:52 AM Performed by: Nelda Marseille, CRNA Pre-anesthesia Checklist: Patient identified, Emergency Drugs available and Suction available Patient Re-evaluated:Patient Re-evaluated prior to induction Oxygen Delivery Method: Circle system utilized Preoxygenation: Pre-oxygenation with 100% oxygen Induction Type: IV induction Ventilation: Mask ventilation without difficulty Laryngoscope Size: 3, McGraph and 4 Grade View: Grade I Tube type: Oral Tube size: 7.5 mm Number of attempts: 1 Airway Equipment and Method: Stylet and Video-laryngoscopy Placement Confirmation: ETT inserted through vocal cords under direct vision, positive ETCO2 and breath sounds checked- equal and bilateral Secured at: 23 cm Tube secured with: Tape Dental Injury: Teeth and Oropharynx as per pre-operative assessment

## 2021-01-16 NOTE — H&P (Signed)
PREOPERATIVE H&P  Chief Complaint: Right Shoulder Rotator Cuff Tear  HPI: Elijah Walker is a 70 y.o. male who presents for preoperative history and physical with a diagnosis of Right Shoulder Rotator Cuff Tear confirmed by MRI. Symptoms of pain, weakness and limited range of motion are significantly impairing activities of daily living.  Patient has undergone a previous successful left shoulder rotator cuff repair by me and wished to proceed with right shoulder rotator cuff repair.  Past Medical History:  Diagnosis Date   Blood in stool    H/O   History of chicken pox    Hyperlipidemia    PONV (postoperative nausea and vomiting)    Past Surgical History:  Procedure Laterality Date   CHOLECYSTECTOMY  1998   COLONOSCOPY     SHOULDER ARTHROSCOPY WITH BANKART REPAIR Left 09/14/2017   Procedure: SHOULDER ARTHROSCOPY WITH MINI OPEN ROTATOR CUFF REPAIR, SUBACROMINAL DECOMPRESSION, LABRAL REPAIR,BICEP TENOTOMY, DISTAL CLAVICLE EXCISION ;  Surgeon: Thornton Park, MD;  Location: [facility];  Service: Orthopedics;  Laterality: Left;   Social History   Socioeconomic History   Marital status: Married    Spouse name: Not on file   Number of children: Not on file   Years of education: Not on file   Highest education level: Not on file  Occupational History   Not on file  Tobacco Use   Smoking status: Never   Smokeless tobacco: Never  Vaping Use   Vaping Use: Never used  Substance and Sexual Activity   Alcohol use: No    Alcohol/week: 0.0 standard drinks    Comment: occ   Drug use: No   Sexual activity: Yes  Other Topics Concern   Not on file  Social History Narrative   Not on file   Social Determinants of Health   Financial Resource Strain: Low Risk    Difficulty of Paying Living Expenses: Not hard at all  Food Insecurity: No Food Insecurity   Worried About Charity fundraiser in the Last Year: Never true   [place] in the Last Year: Never true  Transportation Needs: No  Transportation Needs   Lack of Transportation (Medical): No   Lack of Transportation (Non-Medical): No  Physical Activity: Sufficiently Active   Days of Exercise per Week: 4 days   Minutes of Exercise per Session: 150+ min  Stress: No Stress Concern Present   Feeling of Stress : Not at all  Social Connections: Unknown   Frequency of Communication with Friends and Family: Not on file   Frequency of Social Gatherings with Friends and Family: Not on file   Attends Religious Services: Not on Electrical engineer or Organizations: Not on file   Attends Archivist Meetings: Not on file   Marital Status: Married   Family History  Problem Relation Age of Onset   Dementia Mother    Diabetes Father    Colitis Sister    Autoimmune disease Brother    Colitis Daughter    No Known Allergies Prior to Admission medications   Not on File     Positive ROS: All other systems have been reviewed and were otherwise negative with the exception of those mentioned in the HPI and as above.  Physical Exam: General: Alert, no acute distress Cardiovascular: Regular rate and rhythm, no murmurs rubs or gallops.  No pedal edema Respiratory: Clear to auscultation bilaterally, no wheezes rales or rhonchi. No cyanosis, no use of accessory musculature GI: No organomegaly,  abdomen is soft and non-tender nondistended with positive bowel sounds. Skin: Skin intact, no lesions within the operative field. Neurologic: Sensation intact distally Psychiatric: Patient is competent for consent with normal mood and affect Lymphatic: No cervical lymphadenopathy  MUSCULOSKELETAL: Right Shoulder: The patient can forward elevate and abduct to approximately 90 degrees with pain. He demonstrates weakness to shoulder abduction, minimal weakness to shoulder external rotation. He has no internal rotation or weakness. He has full digital, wrist and elbow range of motion, intact sensation to light touch and a  palpable radial pulse. He had pain with impingement testing, but no apprehension or instability.    Radiology: I reviewed the MRI images as well as radiology report. The patient was given a copy of his MRI report. He has a complete, but minimally retracted tear involving the supraspinatus insertion of the greater tuberosity. There is no fatty infiltration of the supraspinatus muscle seen. The patient has a shallow chronic Hill-Sachs lesion of the humeral head with chronic superior, anterior and inferior labral tear. There is no evidence of a Bankart fracture. The patient has a partially torn tendon of the long head of the biceps, which is medially subluxed within the intertuberous groove.   Assessment: Right Shoulder Rotator Cuff Tear  Plan: Plan for Procedure(s): RIGHT SHOULDER ARTHROSCOPIC SUBACCROMINAL DECOMPRESSION AND DISTAL CLAVICLE EXCISION WITH MINI-OPEN ROTATOR CUFF REPAIR  I reviewed the details of the operation as well as the postoperative course with the patient.  He is familiar with the surgery and postop course having been through it before with his left shoulder.  I reviewed the patient's labs and MRI in preparation for this case.  Preop history and physical was performed at the bedside today.  I marked the right shoulder according the hospital's correct site of surgery protocol.  I discussed the risks and benefits of surgery. The risks include but are not limited to infection, bleeding, nerve or blood vessel injury, joint stiffness or loss of motion, persistent pain, weakness or instability, retear of the rotator cuff and failure of the repair and the need for further surgery. Medical risks include but are not limited to DVT and pulmonary embolism, myocardial infarction, stroke, pneumonia, respiratory failure and death. Patient understood these risks and wished to proceed.     Thornton Park, MD   01/16/2021 11:34 AM

## 2021-01-16 NOTE — Op Note (Signed)
01/16/2021  3:52 PM  PATIENT:  Elijah Walker  70 y.o. male  PRE-OPERATIVE DIAGNOSIS:  Right Shoulder Rotator Cuff Tear with subacromial impingement and AC joint arthrosis  POST-OPERATIVE DIAGNOSIS:  Right Shoulder Rotator Cuff Tear, high-grade partial tear of the long head of the biceps tendon, anterior labral tear, superior labral tear, subacromial impingement and AC joint arthrosis  PROCEDURE:  Procedure(s): RIGHT SHOULDER ARTHROSCOPIC SUBACROMINAL DECOMPRESSION AND DISTAL CLAVICLE EXCISION WITH MINI-OPEN ROTATOR CUFF REPAIR AND OPEN BICEPS TENODESIS  SURGEON:  Surgeon(s) and Role:    Thornton Park, MD - Primary  ANESTHESIA:   general and paracervical block   PREOPERATIVE INDICATIONS:  Elijah Walker is a  70 y.o. male with a diagnosis of Right Shoulder Rotator Cuff Tear, confirmed by MRI after a fall.  Patient failed conservative measures and elected for surgical repair of the rotator cuff.    I discussed the risks and benefits of surgery. The risks include but are not limited to infection, bleeding, nerve or blood vessel injury, joint stiffness or loss of motion, persistent pain, weakness or instability, and hardware failure and the need for further surgery. Medical risks include but are not limited to DVT and pulmonary embolism, myocardial infarction, stroke, pneumonia, respiratory failure and death. Patient understood these risks and wished to proceed.   OPERATIVE IMPLANTS:   Arthrex bio suturetak anchors  3 for anterior labral repair  Smith [employer] Nephew MultiFix anchors x3  Smith & Nephew Q fix anchors x2  OPERATIVE FINDINGS: Right shoulder shoulder posterior Hill-Sachs lesion with anterior labral tear.  Superior labral tear.  High-grade biceps tendon tear.  Subacromial impingement.  AC joint arthrosis.  OPERATIVE PROCEDURE:  I met with the patient preoperative area.  A pre-op history and physical was performed.  I signed the right shoulder according the hospital's correct site of  surgery protocol.  I answered all questions by the patient. An interscalene block with Exparel was performed by the anesthesia service in the preoperative area.  Patient was then brought to the operating room.  He underwent general endotracheal intubation.  He was then positioned in a beachchair position. All bony prominences were adequately padded including the lower extremities.  Examination under anesthesia revealed no obvious laxity with load and shift testing, and a negative sulcus sign testing respectively.  The patient was then prepped and draped in a sterile fashion. The patient received 2 g Ancef IV prior to the onset of the case.  A timeout was performed to verify the patient's name, date of birth, medical record number, correct site of surgery and correct procedure to be performed.The timeout was also used to verify the patient received antibiotics that all appropriate instruments, implants and radiographic studies were available in the room. Once all in attendance were in agreement case began.  Bony landmarks were drawn out with a surgical marker along with proposed incisions.  An 11 blade was used to establish a posterior portal through which the arthroscope was placed in the glenohumeral joint. An anterior portal was established under direct visualization using an 18-gauge spinal needle for localization. A 5.75 mm arthroscopic cannula was inserted through the anterior portal.  A full diagnostic examination of the glenohumeral joint was performed.  An anterolateral portal was established again under direct visualization using an 18-gauge spinal needle. A 7 mm cannula was placed through this anterolateral portal.  The anterior labrum was then mobilized with an arthroscopic elevator.  The nonarticular portion of anterior glenoid was debrided with a 4.58m resector shaver blade  until punctate bleeding was identified.  Three Arthrex bio suture tack anchors were used to repair the anterior labrum.   They were placed in succession beginning with an anchor at the 5:00, 4:00 and 3:00 positions.  A 90 degree Arthrex suture lasso was then used to shuttle a single limb of each of these anchors through the anterior inferior capsule.  Arthroscopic knot tying technique was then used to complete the capsulorrhaphy anteriorly.  The repair was then probed and found to be stable.  Final arthroscopic images were taken.  Attention was then turned to the biceps tendon.  A lateral portal was established under direct visualization using an 18-gauge spinal needle.  A perfect pass suture was then placed into the biceps tendon from the lateral portal through the rotator cuff tear.  Once the suture was in place, an arthroscopic scissor was used to release the biceps from the superior labrum.  The superior labrum was torn and debrided with a 4.0 mm resector shaver blade.  The arthroscope was then placed in the subacromial space.  Extensive bursitis was encountered and debrided using a 4.0 resector shaver blade and a 90 ArthroCare wand from the lateral portal. A subacromial decompression was also performed using a 5.5 mm resector shaver blade from the lateral portal and a distal clavicle excision was performed from the anterior portal also with a 5.5 mm resector shaver blade..    Three Smith & Nephew Perfect Pass sutures were placed in the lateral border of the rotator cuff tear. The greater tuberosity was debrided using a 5.5 mm resector shaver blade to remove all remaining foreign fibers of the rotator cuff.  Debridement was performed until punctate bleeding was seen at the greater tuberosity footprint, which will allow for rotator cuff healing.  Final arthroscopic images were taken pf the rotator cuff tear and all arthroscopic instruments were then removed and the mini-open portion of the procedure began.   A saber-type incision was made along the lateral border of the acromion. The deltoid muscle was identified and split  in line with its fibers which allowed visualization of the rotator cuff.  The biceps tendon and its associated tagging suture were brought out through the deltoid split.  The Perfect Pass suture previously placed in the lateral border of the rotator cuff were also brought out through the deltoid split.    A self-retaining shoulder retractor was then placed.  This allowed for visualization of both rotator cuff tear and biceps.  A second perfect pass suture was placed in the biceps tendon.  Approximately 15 mm of biceps tendon was resected using a #15 blade.  A [place] MultiFix anchor was then used to perform a tenodesis at the top of the intertuberous groove.  An additional perfect pass suture was then placed through the lateral border of the supraspinatus tear. The lateral edge of the rotator cuff was debrided until healthy, viable tissue remained.  Two Q fix anchors were then placed at the articular margin of the humeral head. The 4 limbs of each anchor were then passed medially through the rotator cuff with a perfect pass suture passer. These were clamped with a hemostat for later medial row fixation.  The four perfect pass sutures from the lateral border of the rotator cuff were then anchored to the greater tuberosity of the humeral head using two [place] Multifix anchors. These anchors were tensioned to allow for anatomic reduction of the rotator cuff to the greater tuberosity footprint.  The  medial row repair was then completed using an arthroscopic knot tying technique with the Q fix anchor sutures.  Once all sutures were tied down, arthroscopic images of the double row repair were taken with the arthroscope both externally and arthroscopically from the glenohumeral joint.  All incisions were copiously irrigated. The deltoid fascia was repaired using a 0 Vicryl suturean interrupted fashion.  The subcutaneous tissue of all incisions were closed with a 2-0 Vicryl. Skin closure for the  arthroscopic incisions was performed with 4-0 nylon. The skin edges of the saber incision were approximated with a running 4-0 undyed Monocryl.  A dry sterile dressing was applied.  The patient was placed in an abduction sling, with a Polar Care sleeve.  All sharp and instrument counts were correct at the conclusion of the case. I was scrubbed and present for the entire case. I spoke with the patient's wife postoperatively to let her know the case had been performed without complication and the patient was stable in recovery room.  I reviewed with her the findings of the surgery and the postoperative instructions.  I answered all her questions.    Timoteo Gaul, MD

## 2021-01-16 NOTE — Anesthesia Preprocedure Evaluation (Signed)
Anesthesia Evaluation  Patient identified by MRN, date of birth, ID band Patient awake    Reviewed: Allergy & Precautions, NPO status , Patient's Chart, lab work & pertinent test results  History of Anesthesia Complications (+) PONV and history of anesthetic complications  Airway Mallampati: II  TM Distance: >3 FB Neck ROM: Full    Dental no notable dental hx. (+) Teeth Intact   Pulmonary neg pulmonary ROS, neg sleep apnea, neg COPD, Patient abstained from smoking.Not current smoker,    Pulmonary exam normal breath sounds clear to auscultation       Cardiovascular Exercise Tolerance: Good METS(-) hypertension(-) CAD and (-) Past MI negative cardio ROS  (-) dysrhythmias  Rhythm:Regular Rate:Normal - Systolic murmurs    Neuro/Psych  Headaches, negative psych ROS   GI/Hepatic neg GERD  ,(+)     (-) substance abuse  ,   Endo/Other  neg diabetes  Renal/GU negative Renal ROS     Musculoskeletal   Abdominal   Peds  Hematology   Anesthesia Other Findings Past Medical History: No date: Blood in stool     Comment:  H/O No date: History of chicken pox No date: Hyperlipidemia No date: PONV (postoperative nausea and vomiting)  Reproductive/Obstetrics                             Anesthesia Physical Anesthesia Plan  ASA: 1  Anesthesia Plan: General   Post-op Pain Management:    Induction: Intravenous  PONV Risk Score and Plan: 3 and Ondansetron, Dexamethasone, Propofol infusion, TIVA and Midazolam  Airway Management Planned: Oral ETT  Additional Equipment: None  Intra-op Plan:   Post-operative Plan: Extubation in OR  Informed Consent: I have reviewed the patients History and Physical, chart, labs and discussed the procedure including the risks, benefits and alternatives for the proposed anesthesia with the patient or authorized representative who has indicated his/her understanding  and acceptance.     Dental advisory given  Plan Discussed with: CRNA and Surgeon  Anesthesia Plan Comments: (Discussed risks of anesthesia with patient, including PONV, sore throat, lip/dental damage. Rare risks discussed as well, such as cardiorespiratory and neurological sequelae. Patient understands. Discussed r/b/a of interscalene block, including elective nature. Risks discussed: - Rare: bleeding, infection, nerve damage - shortness of breath from hemidiaphragmatic paralysis - unilateral horner's syndrome - poor/non-working blocks Patient understands and agrees. )        Anesthesia Quick Evaluation

## 2021-01-16 NOTE — Discharge Instructions (Signed)

## 2021-01-16 NOTE — Transfer of Care (Signed)
Immediate Anesthesia Transfer of Care Note  Patient: Elijah Walker  Procedure(s) Performed: RIGHT SHOULDER ARTHROSCOPY WITH MINI-OPEN ROTATOR CUFF REPAIR, SUBACCROMINAL DECOMPRESSION AND DISTAL CLAVICLE EXCISION (Right)  Patient Location: PACU  Anesthesia Type:General  Level of Consciousness: sedated  Airway & Oxygen Therapy: Patient Spontanous Breathing and Patient connected to face mask oxygen  Post-op Assessment: Report given to RN and Post -op Vital signs reviewed and stable  Post vital signs: Reviewed and stable  Last Vitals:  Vitals Value Taken Time  BP    Temp    Pulse 66 01/16/21 1454  Resp    SpO2 96 % 01/16/21 1454  Vitals shown include unvalidated device data.  Last Pain:  Vitals:   01/16/21 1120  TempSrc:   PainSc: 0-No pain         Complications: No notable events documented.

## 2021-01-16 NOTE — Anesthesia Postprocedure Evaluation (Signed)
Anesthesia Post Note  Patient: Elijah Walker  Procedure(s) Performed: RIGHT SHOULDER ARTHROSCOPY WITH MINI-OPEN ROTATOR CUFF REPAIR, SUBACCROMINAL DECOMPRESSION AND DISTAL CLAVICLE EXCISION (Right)  Patient location during evaluation: PACU Anesthesia Type: General Level of consciousness: awake and alert Pain management: pain level controlled Vital Signs Assessment: post-procedure vital signs reviewed and stable Respiratory status: spontaneous breathing, nonlabored ventilation, respiratory function stable and patient connected to nasal cannula oxygen Cardiovascular status: blood pressure returned to baseline and stable Postop Assessment: no apparent nausea or vomiting Anesthetic complications: no   No notable events documented.   Last Vitals:  Vitals:   01/16/21 1454 01/16/21 1500  BP:  139/72  Pulse:  (!) 59  Resp:  17  Temp: (!) 36.2 C   SpO2:  97%    Last Pain:  Vitals:   01/16/21 1454  TempSrc:   PainSc: Asleep                 Arita Miss

## 2021-01-17 ENCOUNTER — Encounter: Payer: Self-pay | Admitting: Orthopedic Surgery

## 2021-01-21 ENCOUNTER — Encounter: Payer: Self-pay | Admitting: Internal Medicine

## 2021-01-22 ENCOUNTER — Other Ambulatory Visit: Payer: PPO

## 2021-01-24 ENCOUNTER — Encounter: Payer: PPO | Admitting: Internal Medicine

## 2021-01-24 DIAGNOSIS — M25511 Pain in right shoulder: Secondary | ICD-10-CM | POA: Diagnosis not present

## 2021-01-24 DIAGNOSIS — M25611 Stiffness of right shoulder, not elsewhere classified: Secondary | ICD-10-CM | POA: Diagnosis not present

## 2021-01-27 ENCOUNTER — Other Ambulatory Visit (INDEPENDENT_AMBULATORY_CARE_PROVIDER_SITE_OTHER): Payer: PPO

## 2021-01-27 ENCOUNTER — Other Ambulatory Visit: Payer: Self-pay

## 2021-01-27 DIAGNOSIS — R739 Hyperglycemia, unspecified: Secondary | ICD-10-CM | POA: Diagnosis not present

## 2021-01-27 DIAGNOSIS — E7800 Pure hypercholesterolemia, unspecified: Secondary | ICD-10-CM

## 2021-01-27 LAB — BASIC METABOLIC PANEL
BUN: 16 mg/dL (ref 6–23)
CO2: 27 mEq/L (ref 19–32)
Calcium: 9.5 mg/dL (ref 8.4–10.5)
Chloride: 102 mEq/L (ref 96–112)
Creatinine, Ser: 1.13 mg/dL (ref 0.40–1.50)
GFR: 66.29 mL/min (ref >60.00)
Glucose, Bld: 99 mg/dL (ref 70–99)
Potassium: 3.9 mEq/L (ref 3.5–5.1)
Sodium: 138 mEq/L (ref 135–145)

## 2021-01-27 LAB — HEPATIC FUNCTION PANEL
ALT: 26 U/L (ref 0–53)
AST: 19 U/L (ref 0–37)
Albumin: 4.3 g/dL (ref 3.5–5.2)
Alkaline Phosphatase: 95 U/L (ref 39–117)
Bilirubin, Direct: 0.2 mg/dL (ref 0.0–0.3)
Total Bilirubin: 1.2 mg/dL (ref 0.2–1.2)
Total Protein: 6.6 g/dL (ref 6.0–8.3)

## 2021-01-27 LAB — LIPID PANEL
Cholesterol: 234 mg/dL — ABNORMAL HIGH (ref 0–200)
HDL: 41.7 mg/dL (ref >39.00)
LDL Cholesterol: 165 mg/dL — ABNORMAL HIGH (ref 0–99)
NonHDL: 192.7
Total CHOL/HDL Ratio: 6
Triglycerides: 141 mg/dL (ref 0.0–149.0)
VLDL: 28.2 mg/dL (ref 0.0–40.0)

## 2021-01-27 LAB — HEMOGLOBIN A1C: Hgb A1c MFr Bld: 6.2 % (ref 4.6–6.5)

## 2021-01-28 ENCOUNTER — Ambulatory Visit (INDEPENDENT_AMBULATORY_CARE_PROVIDER_SITE_OTHER): Payer: PPO | Admitting: Internal Medicine

## 2021-01-28 ENCOUNTER — Encounter: Payer: Self-pay | Admitting: Internal Medicine

## 2021-01-28 ENCOUNTER — Ambulatory Visit (INDEPENDENT_AMBULATORY_CARE_PROVIDER_SITE_OTHER): Payer: PPO

## 2021-01-28 VITALS — BP 124/68 | HR 81 | Temp 97.9°F | Resp 16 | Ht 68.0 in | Wt 165.0 lb

## 2021-01-28 DIAGNOSIS — M25511 Pain in right shoulder: Secondary | ICD-10-CM | POA: Diagnosis not present

## 2021-01-28 DIAGNOSIS — R059 Cough, unspecified: Secondary | ICD-10-CM

## 2021-01-28 DIAGNOSIS — M25512 Pain in left shoulder: Secondary | ICD-10-CM

## 2021-01-28 DIAGNOSIS — Z0000 Encounter for general adult medical examination without abnormal findings: Secondary | ICD-10-CM

## 2021-01-28 DIAGNOSIS — Z125 Encounter for screening for malignant neoplasm of prostate: Secondary | ICD-10-CM

## 2021-01-28 DIAGNOSIS — R6889 Other general symptoms and signs: Secondary | ICD-10-CM

## 2021-01-28 DIAGNOSIS — Z86010 Personal history of colonic polyps: Secondary | ICD-10-CM

## 2021-01-28 DIAGNOSIS — R739 Hyperglycemia, unspecified: Secondary | ICD-10-CM | POA: Diagnosis not present

## 2021-01-28 DIAGNOSIS — F909 Attention-deficit hyperactivity disorder, unspecified type: Secondary | ICD-10-CM

## 2021-01-28 DIAGNOSIS — E7800 Pure hypercholesterolemia, unspecified: Secondary | ICD-10-CM

## 2021-01-28 DIAGNOSIS — R0989 Other specified symptoms and signs involving the circulatory and respiratory systems: Secondary | ICD-10-CM

## 2021-01-28 NOTE — Progress Notes (Signed)
Patient ID: Elijah Walker, male   DOB: 26-Sep-1950, 70 y.o.   MRN: [id number]   Subjective:    Patient ID: Elijah Walker, male    DOB: 11-25-1950, 70 y.o.   MRN: [id number]  HPI This visit occurred during the SARS-CoV-2 public health emergency.  Safety protocols were in place, including screening questions prior to the visit, additional usage of staff PPE, and extensive cleaning of exam room while observing appropriate contact time as indicated for disinfecting solutions.   Patient here for his physical exam. He just had right shoulder surgery.  Doing well.  Limited with activity.  No chest pain.  Does report for last two months, clears throat - phlegm.  Some occasional cough from this.  No chest congestion.  No sob.  No acid reflux reported.  No abdominal pain.  Bowels moving.  Handling stress.   Past Medical History:  Diagnosis Date   Blood in stool    H/O   History of chicken pox    Hyperlipidemia    PONV (postoperative nausea and vomiting)    Past Surgical History:  Procedure Laterality Date   CHOLECYSTECTOMY  1998   COLONOSCOPY     SHOULDER ARTHROSCOPY WITH BANKART REPAIR Left 09/14/2017   Procedure: SHOULDER ARTHROSCOPY WITH MINI OPEN ROTATOR CUFF REPAIR, SUBACROMINAL DECOMPRESSION, LABRAL REPAIR,BICEP TENOTOMY, DISTAL CLAVICLE EXCISION ;  Surgeon: Juanell Fairly, MD;  Location: [facility];  Service: Orthopedics;  Laterality: Left;   SHOULDER ARTHROSCOPY WITH OPEN ROTATOR CUFF REPAIR AND DISTAL CLAVICLE ACROMINECTOMY Right 01/16/2021   Procedure: RIGHT SHOULDER ARTHROSCOPY WITH MINI-OPEN ROTATOR CUFF REPAIR, SUBACCROMINAL DECOMPRESSION AND DISTAL CLAVICLE EXCISION;  Surgeon: Juanell Fairly, MD;  Location: [facility];  Service: Orthopedics;  Laterality: Right;   Family History  Problem Relation Age of Onset   Dementia Mother    Diabetes Father    Colitis Sister    Autoimmune disease Brother    Colitis Daughter    Social History   Socioeconomic History   Marital status: Married     Spouse name: Not on file   Number of children: Not on file   Years of education: Not on file   Highest education level: Not on file  Occupational History   Not on file  Tobacco Use   Smoking status: Never   Smokeless tobacco: Never  Vaping Use   Vaping Use: Never used  Substance and Sexual Activity   Alcohol use: No    Alcohol/week: 0.0 standard drinks    Comment: occ   Drug use: No   Sexual activity: Yes  Other Topics Concern   Not on file  Social History Narrative   Not on file   Social Determinants of Health   Financial Resource Strain: Low Risk    Difficulty of Paying Living Expenses: Not hard at all  Food Insecurity: No Food Insecurity   Worried About Programme researcher, broadcasting/film/video in the Last Year: Never true   Ran Out of Food in the Last Year: Never true  Transportation Needs: No Transportation Needs   Lack of Transportation (Medical): No   Lack of Transportation (Non-Medical): No  Physical Activity: Sufficiently Active   Days of Exercise per Week: 4 days   Minutes of Exercise per Session: 150+ min  Stress: No Stress Concern Present   Feeling of Stress : Not at all  Social Connections: Unknown   Frequency of Communication with Friends and Family: Not on file   Frequency of Social Gatherings with Friends and Family: Not on file  Attends Religious Services: Not on file   Active Member of Clubs or Organizations: Not on file   Attends Archivist Meetings: Not on file   Marital Status: Married    Review of Systems  Constitutional:  Negative for appetite change and unexpected weight change.  HENT:  Negative for congestion, sinus pressure and sore throat.   Eyes:  Negative for pain and visual disturbance.  Respiratory:  Negative for chest tightness and shortness of breath.        Throat congestion/phlegm as outlined.    Cardiovascular:  Negative for chest pain, palpitations and leg swelling.  Gastrointestinal:  Negative for abdominal pain, diarrhea, nausea and  vomiting.  Genitourinary:  Negative for difficulty urinating and dysuria.  Musculoskeletal:  Negative for joint swelling and myalgias.  Skin:  Negative for color change and rash.  Neurological:  Negative for dizziness, light-headedness and headaches.  Hematological:  Negative for adenopathy. Does not bruise/bleed easily.  Psychiatric/Behavioral:  Negative for agitation and dysphoric mood.       Objective:    Physical Exam Constitutional:      General: He is not in acute distress.    Appearance: Normal appearance. He is well-developed.  HENT:     Head: Normocephalic and atraumatic.     Right Ear: External ear normal.     Left Ear: External ear normal.  Eyes:     General: No scleral icterus.       Right eye: No discharge.        Left eye: No discharge.     Conjunctiva/sclera: Conjunctivae normal.  Neck:     Thyroid: No thyromegaly.  Cardiovascular:     Rate and Rhythm: Normal rate and regular rhythm.  Pulmonary:     Effort: No respiratory distress.     Breath sounds: Normal breath sounds. No wheezing.  Abdominal:     General: Bowel sounds are normal.     Palpations: Abdomen is soft.     Tenderness: There is no abdominal tenderness.  Musculoskeletal:        General: No swelling or tenderness.     Cervical back: Neck supple. No tenderness.  Lymphadenopathy:     Cervical: No cervical adenopathy.  Skin:    Findings: No erythema or rash.  Neurological:     Mental Status: He is alert and oriented to person, place, and time.  Psychiatric:        Mood and Affect: Mood normal.        Behavior: Behavior normal.    BP 124/68   Pulse 81   Temp 97.9 F (36.6 C)   Resp 16   Ht _0  (1.727 m)   Wt 165 lb (74.8 kg)   SpO2 98%   BMI 25.09 kg/m  Wt Readings from Last 3 Encounters:  01/28/21 165 lb (74.8 kg)  01/16/21 160 lb 0.9 oz (72.6 kg)  12/30/20 167 lb 3.2 oz (75.8 kg)    Outpatient Encounter Medications as of 01/28/2021  Medication Sig   [DISCONTINUED] ondansetron  (ZOFRAN) 4 MG tablet Take 1 tablet (4 mg total) by mouth every 8 (eight) hours as needed for nausea or vomiting.   [DISCONTINUED] oxyCODONE (OXY IR/ROXICODONE) 5 MG immediate release tablet Take 1 tablet (5 mg total) by mouth every 4 (four) hours as needed.   No facility-administered encounter medications on file as of 01/28/2021.     Lab Results  Component Value Date   WBC 6.1 12/30/2020   HGB 16.1 12/30/2020  HCT 48.2 12/30/2020   PLT 234.0 12/30/2020   GLUCOSE 99 01/27/2021   CHOL 234 (H) 01/27/2021   TRIG 141.0 01/27/2021   HDL 41.70 01/27/2021   LDLCALC 165 (H) 01/27/2021   ALT 26 01/27/2021   AST 19 01/27/2021   NA 138 01/27/2021   K 3.9 01/27/2021   CL 102 01/27/2021   CREATININE 1.13 01/27/2021   BUN 16 01/27/2021   CO2 27 01/27/2021   TSH 2.71 08/26/2020   PSA 1.62 04/24/2020   INR 1.0 08/30/2017   HGBA1C 6.2 01/27/2021   MICROALBUR <0.7 07/20/2016       Assessment & Plan:   Problem List Items Addressed This Visit     ADHD    Off adderall.  Desires to remain off medication.  Follow.        Cough    Occasional cough and phlegm as outlined.  Check cxr.  Treat allergies.         Relevant Orders   DG Chest 2 View (Completed)   Basic metabolic panel   Health care maintenance    Physical today 01/28/21.  Colonoscopy 10/2013.  Recommended f/u in 10 years.  PSA - 04/24/20 - 1.62.        History of colonic polyps    Colonoscopy 10/2013.  Recommended f/u in 10 years.         Hypercholesterolemia    The 10-year ASCVD risk score Mikey Bussing DC Brooke Bonito., et al., 2013) is: 18.9%   Values used to calculate the score:     Age: 79 years     Sex: Male     Is Non-Hispanic African American: No     Diabetic: No     Tobacco smoker: No     Systolic Blood Pressure: 275 mmHg     Is BP treated: No     HDL Cholesterol: 41.7 mg/dL     Total Cholesterol: 234 mg/dL  Intolerance to statin medication and zetia.  Discussed other treatment options - repatha, etc.  CCM referral.         Relevant Orders   Hepatic function panel   Lipid panel   AMB Referral to Community Care Coordinaton   Hyperglycemia    Low carb diet and exercise.  Follow met b and a1c.        Relevant Orders   Hemoglobin A1c   Shoulder pain    S/p surgery 01/15/21.  continues to follow up with PT.         Throat clearing    Some phlegm and congestion in throat.  Occasional cough with the congestion.  Check cxr.  Treat allergy symptoms.  No acid reflux.  Follow.         Other Visit Diagnoses     Routine general medical examination at a health care facility    -  Primary   Prostate cancer screening       Relevant Orders   PSA, Medicare        [name], MD

## 2021-01-28 NOTE — Assessment & Plan Note (Addendum)
The 10-year ASCVD risk score Mikey Bussing DC Brooke Bonito., et al., 2013) is: 18.9%   Values used to calculate the score:     Age: 70 years     Sex: Male     Is Non-Hispanic African American: No     Diabetic: No     Tobacco smoker: No     Systolic Blood Pressure: 213 mmHg     Is BP treated: No     HDL Cholesterol: 41.7 mg/dL     Total Cholesterol: 234 mg/dL  Intolerance to statin medication and zetia.  Discussed other treatment options - repatha, etc.  CCM referral.

## 2021-01-29 DIAGNOSIS — M25511 Pain in right shoulder: Secondary | ICD-10-CM | POA: Diagnosis not present

## 2021-01-29 DIAGNOSIS — M25611 Stiffness of right shoulder, not elsewhere classified: Secondary | ICD-10-CM | POA: Diagnosis not present

## 2021-01-31 DIAGNOSIS — M25511 Pain in right shoulder: Secondary | ICD-10-CM | POA: Diagnosis not present

## 2021-01-31 DIAGNOSIS — M25611 Stiffness of right shoulder, not elsewhere classified: Secondary | ICD-10-CM | POA: Diagnosis not present

## 2021-02-04 ENCOUNTER — Telehealth: Payer: Self-pay

## 2021-02-04 ENCOUNTER — Encounter: Payer: Self-pay | Admitting: Internal Medicine

## 2021-02-04 DIAGNOSIS — M25511 Pain in right shoulder: Secondary | ICD-10-CM | POA: Diagnosis not present

## 2021-02-04 DIAGNOSIS — M25611 Stiffness of right shoulder, not elsewhere classified: Secondary | ICD-10-CM | POA: Diagnosis not present

## 2021-02-04 NOTE — Assessment & Plan Note (Signed)
Low carb diet and exercise.  Follow met b and a1c.  

## 2021-02-04 NOTE — Assessment & Plan Note (Signed)
Off adderall.  Desires to remain off medication.  Follow.

## 2021-02-04 NOTE — Assessment & Plan Note (Signed)
Colonoscopy 10/2013.  Recommended f/u in 10 years.   

## 2021-02-04 NOTE — Assessment & Plan Note (Signed)
S/p surgery 01/15/21.  continues to follow up with PT.

## 2021-02-04 NOTE — Assessment & Plan Note (Signed)
Some phlegm and congestion in throat.  Occasional cough with the congestion.  Check cxr.  Treat allergy symptoms.  No acid reflux.  Follow.

## 2021-02-04 NOTE — Assessment & Plan Note (Signed)
Physical today 01/28/21.  Colonoscopy 10/2013.  Recommended f/u in 10 years.  PSA - 04/24/20 - 1.62.

## 2021-02-04 NOTE — Chronic Care Management (AMB) (Signed)
  Chronic Care Management   Outreach Note  02/04/2021 Name: Lexis Prenatt MRN: [id number] DOB: [birth year]  Andretti Stones is a 70 y.o. year old male who is a primary care patient of [name], MD. I reached out to [name] by phone today in response to a referral sent by Mr. Lev Runyan's PCP, [name], MD      An unsuccessful telephone outreach was attempted today. The patient was referred to the case management team for assistance with care management and care coordination.   Follow Up Plan: A HIPAA compliant phone message was left for the patient providing contact information and requesting a return call.  The care management team will reach out to the patient again over the next 7 days.  If patient returns call to provider office, please advise to call [facility] at [place], [address], [place], [place] [address] Direct Dial: [phone] [email or web address]: [email or web address]

## 2021-02-04 NOTE — Assessment & Plan Note (Signed)
Occasional cough and phlegm as outlined.  Check cxr.  Treat allergies.

## 2021-02-05 NOTE — Chronic Care Management (AMB) (Signed)
  Chronic Care Management   Note  02/05/2021 Name: Naven Giambalvo MRN: [id number] DOB: [birth year]  Burgess Sheriff is a 70 y.o. year old male who is a primary care patient of [name], MD. I reached out to [name] by phone today in response to a referral sent by Mr. Narciso Dacanay's PCP, [name], MD      Mr. Dedman was given information about Chronic Care Management services today including:  CCM service includes personalized support from designated clinical staff supervised by his physician, including individualized plan of care and coordination with other care providers 24/7 contact phone numbers for assistance for urgent and routine care needs. Service will only be billed when office clinical staff spend 20 minutes or more in a month to coordinate care. Only one practitioner may furnish and bill the service in a calendar month. The patient may stop CCM services at any time (effective at the end of the month) by phone call to the office staff. The patient will be responsible for cost sharing (co-pay) of up to 20% of the service fee (after annual deductible is met).  Patient agreed to services and verbal consent obtained.   Follow up plan: Telephone appointment with care management team member scheduled for:02/20/2021  Noreene Larsson, [place], [place], [place] [address] Direct Dial: [phone] [email or web address]: [email or web address]

## 2021-02-18 DIAGNOSIS — M25511 Pain in right shoulder: Secondary | ICD-10-CM | POA: Diagnosis not present

## 2021-02-18 DIAGNOSIS — M25611 Stiffness of right shoulder, not elsewhere classified: Secondary | ICD-10-CM | POA: Diagnosis not present

## 2021-02-20 ENCOUNTER — Ambulatory Visit (INDEPENDENT_AMBULATORY_CARE_PROVIDER_SITE_OTHER): Payer: PPO | Admitting: Pharmacist

## 2021-02-20 DIAGNOSIS — R739 Hyperglycemia, unspecified: Secondary | ICD-10-CM

## 2021-02-20 DIAGNOSIS — E7800 Pure hypercholesterolemia, unspecified: Secondary | ICD-10-CM

## 2021-02-20 DIAGNOSIS — R944 Abnormal results of kidney function studies: Secondary | ICD-10-CM

## 2021-02-20 DIAGNOSIS — M791 Myalgia, unspecified site: Secondary | ICD-10-CM

## 2021-02-20 DIAGNOSIS — T466X5A Adverse effect of antihyperlipidemic and antiarteriosclerotic drugs, initial encounter: Secondary | ICD-10-CM

## 2021-02-20 NOTE — Chronic Care Management (AMB) (Signed)
Chronic Care Management Pharmacy Note  02/20/2021 Name:  Djimon Lundstrom MRN:  [id number] DOB:  1950/11/24  Subjective: [place] Steidle is an 70 y.o. year old male who is a primary patient of [name], MD.  The CCM team was consulted for assistance with disease management and care coordination needs.    Engaged with patient by telephone for initial visit in response to provider referral for pharmacy case management and/or care coordination services.   Consent to Services:  The patient was given the following information about Chronic Care Management services today, agreed to services, and gave verbal consent: 1. CCM service includes personalized support from designated clinical staff supervised by the primary care provider, including individualized plan of care and coordination with other care providers 2. 24/7 contact phone numbers for assistance for urgent and routine care needs. 3. Service will only be billed when office clinical staff spend 20 minutes or more in a month to coordinate care. 4. Only one practitioner may furnish and bill the service in a calendar month. 5.The patient may stop CCM services at any time (effective at the end of the month) by phone call to the office staff. 6. The patient will be responsible for cost sharing (co-pay) of up to 20% of the service fee (after annual deductible is met). Patient agreed to services and consent obtained.  Patient Care Team: [name], MD as PCP - General (Internal Medicine) De Hollingshead, RPH-CPP (Pharmacist)  Recent office visits: 7/19 - PCP; 10 year ASCVD 18.9%, A1c 6.2%, LDL 165  Objective:  Lab Results  Component Value Date   CREATININE 1.13 01/27/2021   CREATININE 1.02 12/30/2020   CREATININE 1.21 08/26/2020    Lab Results  Component Value Date   HGBA1C 6.2 01/27/2021   Last diabetic Eye exam: No results found for: HMDIABEYEEXA  Last diabetic Foot exam: No results found for: HMDIABFOOTEX       Component Value Date/Time   CHOL 234 (H) 01/27/2021 0934   TRIG 141.0 01/27/2021 0934   HDL 41.70 01/27/2021 0934   CHOLHDL 6 01/27/2021 0934   VLDL 28.2 01/27/2021 0934   LDLCALC 165 (H) 01/27/2021 0934    Hepatic Function Latest Ref Rng & Units 01/27/2021 08/26/2020 04/24/2020  Total Protein 6.0 - 8.3 g/dL 6.6 6.9 6.7  Albumin 3.5 - 5.2 g/dL 4.3 4.1 4.2  AST 0 - 37 U/L _0 ALT 0 - 53 U/L _1 Alk Phosphatase 39 - 117 U/L 95 58 67  Total Bilirubin 0.2 - 1.2 mg/dL 1.2 0.7 0.8  Bilirubin, Direct 0.0 - 0.3 mg/dL 0.2 0.1 0.1    Lab Results  Component Value Date/Time   TSH 2.71 08/26/2020 08:13 AM   TSH 2.53 06/28/2019 08:00 AM    CBC Latest Ref Rng & Units 12/30/2020 04/24/2020 06/28/2019  WBC 4.0 - 10.5 K/uL 6.1 4.2 4.4  Hemoglobin 13.0 - 17.0 g/dL 16.1 15.2 14.9  Hematocrit 39.0 - 52.0 % 48.2 45.2 44.3  Platelets 150.0 - 400.0 K/uL 234.0 210.0 226.0    No results found for: VD25OH  Clinical ASCVD: No  The 10-year ASCVD risk score Mikey Bussing DC Jr., et al., 2013) is: 18.9%   Values used to calculate the score:     Age: 82 years     Sex: Male     Is Non-Hispanic African American: No     Diabetic: No     Tobacco smoker: No     Systolic Blood Pressure: 818 mmHg  Is BP treated: No     HDL Cholesterol: 41.7 mg/dL     Total Cholesterol: 234 mg/dL      Social History   Tobacco Use  Smoking Status Never  Smokeless Tobacco Never   BP Readings from Last 3 Encounters:  01/28/21 124/68  01/16/21 140/75  12/30/20 128/76   Pulse Readings from Last 3 Encounters:  01/28/21 81  01/16/21 61  12/30/20 63   Wt Readings from Last 3 Encounters:  01/28/21 165 lb (74.8 kg)  01/16/21 160 lb 0.9 oz (72.6 kg)  12/30/20 167 lb 3.2 oz (75.8 kg)    Assessment: Review of patient past medical history, allergies, medications, health status, including review of consultants reports, laboratory and other test data, was performed as part of comprehensive evaluation and  provision of chronic care management services.   SDOH:  (Social Determinants of Health) assessments and interventions performed:  SDOH Interventions    Flowsheet Row Most Recent Value  SDOH Interventions   Financial Strain Interventions Intervention Not Indicated       CCM Care Plan  No Known Allergies  Medications Reviewed Today     Reviewed by De Hollingshead, RPH-CPP (Pharmacist) on 02/20/21 at 1458  Med List Status: <None>   Medication Order Taking? Sig Documenting Provider Last Dose Status Informant           No Medications to Display                            Patient Active Problem List   Diagnosis Date Noted   Cough 01/28/2021   Facial trauma 10/24/2020   Fall 10/24/2020   Rash 06/30/2020   Breast lump on left side at 12 o'clock position 01/15/2020   Plantar warts 10/08/2019   Shoulder pain 10/03/2019   Decreased GFR 10/03/2019   Throat clearing 07/02/2019   Knee pain 12/10/2018   ADHD 11/06/2018   Ear pain 06/19/2018   Pre-op evaluation 08/30/2017   Left shoulder pain 02/25/2017   Skin lesion 12/04/2016   Elevated blood pressure reading 07/20/2016   Skin lesion of face 12/19/2015   BPV (benign positional vertigo) 02/03/2015   Lung nodule 02/03/2015   History of colonic polyps 02/03/2015   Health care maintenance 08/18/2014   Hypercholesterolemia 05/13/2014   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   Anemia 05/13/2014   Hyperglycemia 05/13/2014    Immunization History  Administered Date(s) Administered   Fluad Quad(high Dose 65+) 03/21/2019   Influenza Whole 06/22/2016   Influenza, High Dose Seasonal PF 04/08/2020   Influenza,inj,Quad PF,6+ Mos 05/08/2014, 05/22/2015   Influenza-Unspecified 04/05/2013, 03/18/2017, 04/26/2018   PFIZER(Purple Top)SARS-COV-2 Vaccination 08/27/2019, 09/20/2019, 05/09/2020   PPD Test 08/29/2014   Pneumococcal Conjugate-13 09/01/2016   Pneumococcal Polysaccharide-23 03/24/2018   Tdap 07/04/2008   Zoster, Live  01/23/2011    Conditions to be addressed/monitored: HLD and prediabetes  Care Plan : Medication Management  Updates made by De Hollingshead, RPH-CPP since 02/20/2021 12:00 AM     Problem: Hyperlipidemia, HTN, pre-diabetes      Long-Range Goal: Disease Progression Prevention   Start Date: 02/20/2021  This Visit's Progress: On track  Priority: High  Note:   Current Barriers:  Unable to achieve control of cholesterol   Pharmacist Clinical Goal(s):  Over the next 90 days, patient will achieve control of cholesterol as evidenced by LDL  through collaboration with PharmD and provider.    Interventions: 1:1 collaboration with [name], MD regarding development and update  of comprehensive plan of care as evidenced by provider attestation and co-signature Inter-disciplinary care team collaboration (see longitudinal plan of care) Comprehensive medication review performed; medication list updated in electronic medical record  Hyperlipidemia: Uncontrolled; current treatment: none Medications previously tried: rosuvastatin 10 mg, atorvastatin 20 mg, pravastatin 20 mg, ezetimibe 10 mg - - bilateral muscle pain that started with medication initiation and resolved with medication discontinuation Extensive education on PCSK9i, including mechanism of action, side effects, benefits. Patient amenable to initiation. Reviewed insurance formulary, Repatha is preferred with [employer]. Completed PA via Cover My Meds. Will await result, if approved, will plan to provide patient with samples to start to determine tolerability and provide administration education.   Hyperglycemia/Prediabetes: A1c <6.5%; diet controlled Patient asked if Repatha affects glucose. Discussed potential for impact on glucose per results in clinical trials, however, likely not clinically significant. Patient verbalized understanding.    Patient Goals/Self-Care Activities Over the next 90 days, patient will:   - take medications as prescribed  Follow Up Plan: Telephone follow up appointment with care management team member scheduled for: pending medication access plan      Medication Assistance: None required.  Patient affirms current coverage meets needs.  Patient's preferred pharmacy is:  [id number] [id number]: [phone]: [phone] Follow Up:  Patient agrees to Care Plan and Follow-up.  Plan: Telephone follow up appointment with care management team member scheduled for:  pending medication access plan  Catie TDarnelle Maffucci PharmD, BEdin[place] CEtnaClinical Pharmacist LOccidental [phone]

## 2021-02-20 NOTE — Patient Instructions (Signed)
Visit Information   PATIENT GOALS:   Goals Addressed               This Visit's Progress     Patient Stated     Medication Monitoring (pt-stated)        Patient Goals/Self-Care Activities Over the next 90 days, patient will:  - take medications as prescribed        Consent to CCM Services: Elijah Walker was given information about Chronic Care Management services including:  CCM service includes personalized support from designated clinical staff supervised by his physician, including individualized plan of care and coordination with other care providers 24/7 contact phone numbers for assistance for urgent and routine care needs. Service will only be billed when office clinical staff spend 20 minutes or more in a month to coordinate care. Only one practitioner may furnish and bill the service in a calendar month. The patient may stop CCM services at any time (effective at the end of the month) by phone call to the office staff. The patient will be responsible for cost sharing (co-pay) of up to 20% of the service fee (after annual deductible is met).  Patient agreed to services and verbal consent obtained.   Patient verbalizes understanding of instructions provided today and agrees to view in [place].   Plan: Telephone follow up appointment with care management team member scheduled for:  pending medication access plan  [name], PharmD, Para [place], CPP Clinical Pharmacist [place] [facility]: Patient Care Plan: Medication Management     Problem Identified: Hyperlipidemia, HTN, pre-diabetes      Long-Range Goal: Disease Progression Prevention   Start Date: 02/20/2021  This Visit's Progress: On track  Priority: High  Note:   Current Barriers:  Unable to achieve control of cholesterol   Pharmacist Clinical Goal(s):  Over the next 90 days, patient will achieve control of cholesterol as evidenced by LDL  through  collaboration with PharmD and provider.    Interventions: 1:1 collaboration with [name], MD regarding development and update of comprehensive plan of care as evidenced by provider attestation and co-signature Inter-disciplinary care team collaboration (see longitudinal plan of care) Comprehensive medication review performed; medication list updated in electronic medical record  Hyperlipidemia: Uncontrolled; current treatment: none Medications previously tried: rosuvastatin 10 mg, atorvastatin 20 mg, pravastatin 20 mg, ezetimibe 10 mg - - bilateral muscle pain that started with medication initiation and resolved with medication discontinuation Extensive education on PCSK9i, including mechanism of action, side effects, benefits. Patient amenable to initiation. Reviewed insurance formulary, Repatha is preferred with [employer]. Completed PA via Cover My Meds. Will await result, if approved, will plan to provide patient with samples to start to determine tolerability and provide administration education.   Hyperglycemia/Prediabetes: A1c <6.5%; diet controlled Patient asked if Repatha affects glucose. Discussed potential for impact on glucose per results in clinical trials, however, likely not clinically significant. Patient verbalized understanding.    Patient Goals/Self-Care Activities Over the next 90 days, patient will:  - take medications as prescribed  Follow Up Plan: Telephone follow up appointment with care management team member scheduled for: pending medication access plan

## 2021-02-21 ENCOUNTER — Ambulatory Visit: Payer: PPO | Admitting: Pharmacist

## 2021-02-21 DIAGNOSIS — M25611 Stiffness of right shoulder, not elsewhere classified: Secondary | ICD-10-CM | POA: Diagnosis not present

## 2021-02-21 DIAGNOSIS — R739 Hyperglycemia, unspecified: Secondary | ICD-10-CM

## 2021-02-21 DIAGNOSIS — M25511 Pain in right shoulder: Secondary | ICD-10-CM | POA: Diagnosis not present

## 2021-02-21 DIAGNOSIS — E7800 Pure hypercholesterolemia, unspecified: Secondary | ICD-10-CM

## 2021-02-21 MED ORDER — REPATHA SURECLICK 140 MG/ML ~~LOC~~ SOAJ: 140.0000 mg | SUBCUTANEOUS | 0 refills | Status: DC

## 2021-02-21 NOTE — Patient Instructions (Signed)
Visit Information  PATIENT GOALS:  Goals Addressed               This Visit's Progress     Patient Stated     Medication Monitoring (pt-stated)        Patient Goals/Self-Care Activities Over the next 90 days, patient will:  - take medications as prescribed         Patient verbalizes understanding of instructions provided today and agrees to view in [place].   Plan: Face to Face appointment with care management team member scheduled for: 4 days  [name], PharmD, [place], [place] Clinical Pharmacist [employer] at [employer] [phone]

## 2021-02-21 NOTE — Chronic Care Management (AMB) (Signed)
Chronic Care Management Pharmacy Note  02/21/2021 Name:  Mcgregor Tinnon MRN:  [id number] DOB:  27-Feb-1951   Subjective: Elijah Walker is an 70 y.o. year old male who is a primary patient of [name], MD.  The CCM team was consulted for assistance with disease management and care coordination needs.    Engaged with patient by telephone for  medication access follow up  in response to provider referral for pharmacy case management and/or care coordination services.   Consent to Services:  The patient was given information about Chronic Care Management services, agreed to services, and gave verbal consent prior to initiation of services.  Please see initial visit note for detailed documentation.   Patient Care Team: [name], MD as PCP - General (Internal Medicine) De Hollingshead, RPH-CPP (Pharmacist)   Objective:  Lab Results  Component Value Date   CREATININE 1.13 01/27/2021   CREATININE 1.02 12/30/2020   CREATININE 1.21 08/26/2020    Lab Results  Component Value Date   HGBA1C 6.2 01/27/2021   Last diabetic Eye exam: No results found for: HMDIABEYEEXA  Last diabetic Foot exam: No results found for: HMDIABFOOTEX      Component Value Date/Time   CHOL 234 (H) 01/27/2021 0934   TRIG 141.0 01/27/2021 0934   HDL 41.70 01/27/2021 0934   CHOLHDL 6 01/27/2021 0934   VLDL 28.2 01/27/2021 0934   LDLCALC 165 (H) 01/27/2021 0934    Hepatic Function Latest Ref Rng & Units 01/27/2021 08/26/2020 04/24/2020  Total Protein 6.0 - 8.3 g/dL 6.6 6.9 6.7  Albumin 3.5 - 5.2 g/dL 4.3 4.1 4.2  AST 0 - 37 U/L '19 17 18  ' ALT 0 - 53 U/L '26 25 26  ' Alk Phosphatase 39 - 117 U/L 95 58 67  Total Bilirubin 0.2 - 1.2 mg/dL 1.2 0.7 0.8  Bilirubin, Direct 0.0 - 0.3 mg/dL 0.2 0.1 0.1    Lab Results  Component Value Date/Time   TSH 2.71 08/26/2020 08:13 AM   TSH 2.53 06/28/2019 08:00 AM    CBC Latest Ref Rng & Units 12/30/2020 04/24/2020 06/28/2019  WBC 4.0 - 10.5 K/uL 6.1 4.2  4.4  Hemoglobin 13.0 - 17.0 g/dL 16.1 15.2 14.9  Hematocrit 39.0 - 52.0 % 48.2 45.2 44.3  Platelets 150.0 - 400.0 K/uL 234.0 210.0 226.0    No results found for: VD25OH  Clinical ASCVD: No  The 10-year ASCVD risk score Mikey Bussing DC Jr., et al., 2013) is: 18.9%   Values used to calculate the score:     Age: 19 years     Sex: Male     Is Non-Hispanic African American: No     Diabetic: No     Tobacco smoker: No     Systolic Blood Pressure: 888 mmHg     Is BP treated: No     HDL Cholesterol: 41.7 mg/dL     Total Cholesterol: 234 mg/dL      Social History   Tobacco Use  Smoking Status Never  Smokeless Tobacco Never   BP Readings from Last 3 Encounters:  01/28/21 124/68  01/16/21 140/75  12/30/20 128/76   Pulse Readings from Last 3 Encounters:  01/28/21 81  01/16/21 61  12/30/20 63   Wt Readings from Last 3 Encounters:  01/28/21 165 lb (74.8 kg)  01/16/21 160 lb 0.9 oz (72.6 kg)  12/30/20 167 lb 3.2 oz (75.8 kg)    Assessment: Review of patient past medical history, allergies, medications, health status, including review of consultants  reports, laboratory and other test data, was performed as part of comprehensive evaluation and provision of chronic care management services.   SDOH:  (Social Determinants of Health) assessments and interventions performed:  SDOH Interventions    Flowsheet Row Most Recent Value  SDOH Interventions   Financial Strain Interventions Intervention Not Indicated       CCM Care Plan  No Known Allergies  Medications Reviewed Today     Reviewed by De Hollingshead, RPH-CPP (Pharmacist) on 02/20/21 at 1458  Med List Status: <None>   Medication Order Taking? Sig Documenting Provider Last Dose Status Informant           No Medications to Display                            Patient Active Problem List   Diagnosis Date Noted   Cough 01/28/2021   Facial trauma 10/24/2020   Fall 10/24/2020   Rash 06/30/2020   Breast lump on  left side at 12 o'clock position 01/15/2020   Plantar warts 10/08/2019   Shoulder pain 10/03/2019   Decreased GFR 10/03/2019   Throat clearing 07/02/2019   Knee pain 12/10/2018   ADHD 11/06/2018   Ear pain 06/19/2018   Pre-op evaluation 08/30/2017   Left shoulder pain 02/25/2017   Skin lesion 12/04/2016   Elevated blood pressure reading 07/20/2016   Skin lesion of face 12/19/2015   BPV (benign positional vertigo) 02/03/2015   Lung nodule 02/03/2015   History of colonic polyps 02/03/2015   Health care maintenance 08/18/2014   Hypercholesterolemia 05/13/2014   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   Anemia 05/13/2014   Hyperglycemia 05/13/2014    Immunization History  Administered Date(s) Administered   Fluad Quad(high Dose 65+) 03/21/2019   Influenza Whole 06/22/2016   Influenza, High Dose Seasonal PF 04/08/2020   Influenza,inj,Quad PF,6+ Mos 05/08/2014, 05/22/2015   Influenza-Unspecified 04/05/2013, 03/18/2017, 04/26/2018   PFIZER(Purple Top)SARS-COV-2 Vaccination 08/27/2019, 09/20/2019, 05/09/2020   PPD Test 08/29/2014   Pneumococcal Conjugate-13 09/01/2016   Pneumococcal Polysaccharide-23 03/24/2018   Tdap 07/04/2008   Zoster, Live 01/23/2011    Conditions to be addressed/monitored: HTN and HLD  Care Plan : Medication Management  Updates made by De Hollingshead, RPH-CPP since 02/21/2021 12:00 AM     Problem: Hyperlipidemia, HTN, pre-diabetes      Long-Range Goal: Disease Progression Prevention   Start Date: 02/20/2021  This Visit's Progress: On track  Recent Progress: On track  Priority: High  Note:   Current Barriers:  Unable to achieve control of cholesterol   Pharmacist Clinical Goal(s):  Over the next 90 days, patient will achieve control of cholesterol as evidenced by LDL  through collaboration with PharmD and provider.    Interventions: 1:1 collaboration with [name], MD regarding development and update of comprehensive plan of care as evidenced by  provider attestation and co-signature Inter-disciplinary care team collaboration (see longitudinal plan of care) Comprehensive medication review performed; medication list updated in electronic medical record  Hyperlipidemia: Uncontrolled; current treatment: none Medications previously tried: rosuvastatin 10 mg, atorvastatin 20 mg, pravastatin 20 mg, ezetimibe 10 mg - - bilateral muscle pain that started with medication initiation and resolved with medication discontinuation Repatha PA APPROVED through 05/21/21 (3 months then subsequent should be for a year)  Hyperglycemia/Prediabetes: A1c <6.5%; diet controlled Continue current lifestyle    Patient Goals/Self-Care Activities Over the next 90 days, patient will:  - take medications as prescribed  Follow Up Plan: Face to  face follow up appointment with care management team member scheduled for: 4 days for medication administration      Medication Assistance: None required.  Patient affirms current coverage meets needs.  [facility] pharmacy [facility]  [id number] [place] [address] [address]Phone: [phone]: [phone]  Follow Up:  Patient agrees to Care Plan and Follow-up.  Plan: Face to Face appointment with care management team member scheduled for: 4 days  Catie TDarnelle Maffucci PharmD, [place] CWest Glens FallsClinical Pharmacist LOccidental [phone]

## 2021-02-24 ENCOUNTER — Other Ambulatory Visit: Payer: Self-pay

## 2021-02-24 ENCOUNTER — Ambulatory Visit: Payer: PPO | Admitting: Pharmacist

## 2021-02-24 DIAGNOSIS — E7800 Pure hypercholesterolemia, unspecified: Secondary | ICD-10-CM

## 2021-02-24 DIAGNOSIS — R739 Hyperglycemia, unspecified: Secondary | ICD-10-CM

## 2021-02-24 DIAGNOSIS — M791 Myalgia, unspecified site: Secondary | ICD-10-CM

## 2021-02-24 NOTE — Chronic Care Management (AMB) (Signed)
Chronic Care Management Pharmacy Note  02/24/2021 Name:  Elijah Walker MRN:  [id number] DOB:  1950/08/07  Subjective: Elijah Walker is an 70 y.o. year old male who is a primary patient of [name], MD.  The CCM team was consulted for assistance with disease management and care coordination needs.    Engaged with patient face to face for follow up visit in response to provider referral for pharmacy case management and/or care coordination services.   Consent to Services:  The patient was given information about Chronic Care Management services, agreed to services, and gave verbal consent prior to initiation of services.  Please see initial visit note for detailed documentation.   Patient Care Team: [name], MD as PCP - General (Internal Medicine) De Hollingshead, RPH-CPP (Pharmacist)   Objective:  Lab Results  Component Value Date   CREATININE 1.13 01/27/2021   CREATININE 1.02 12/30/2020   CREATININE 1.21 08/26/2020    Lab Results  Component Value Date   HGBA1C 6.2 01/27/2021   Last diabetic Eye exam: No results found for: HMDIABEYEEXA  Last diabetic Foot exam: No results found for: HMDIABFOOTEX      Component Value Date/Time   CHOL 234 (H) 01/27/2021 0934   TRIG 141.0 01/27/2021 0934   HDL 41.70 01/27/2021 0934   CHOLHDL 6 01/27/2021 0934   VLDL 28.2 01/27/2021 0934   LDLCALC 165 (H) 01/27/2021 0934    Hepatic Function Latest Ref Rng & Units 01/27/2021 08/26/2020 04/24/2020  Total Protein 6.0 - 8.3 g/dL 6.6 6.9 6.7  Albumin 3.5 - 5.2 g/dL 4.3 4.1 4.2  AST 0 - 37 U/L '19 17 18  ' ALT 0 - 53 U/L '26 25 26  ' Alk Phosphatase 39 - 117 U/L 95 58 67  Total Bilirubin 0.2 - 1.2 mg/dL 1.2 0.7 0.8  Bilirubin, Direct 0.0 - 0.3 mg/dL 0.2 0.1 0.1    Lab Results  Component Value Date/Time   TSH 2.71 08/26/2020 08:13 AM   TSH 2.53 06/28/2019 08:00 AM    CBC Latest Ref Rng & Units 12/30/2020 04/24/2020 06/28/2019  WBC 4.0 - 10.5 K/uL 6.1 4.2 4.4   Hemoglobin 13.0 - 17.0 g/dL 16.1 15.2 14.9  Hematocrit 39.0 - 52.0 % 48.2 45.2 44.3  Platelets 150.0 - 400.0 K/uL 234.0 210.0 226.0    No results found for: VD25OH  Clinical ASCVD: No  The 10-year ASCVD risk score Mikey Bussing DC Jr., et al., 2013) is: 18.9%   Values used to calculate the score:     Age: 12 years     Sex: Male     Is Non-Hispanic African American: No     Diabetic: No     Tobacco smoker: No     Systolic Blood Pressure: 056 mmHg     Is BP treated: No     HDL Cholesterol: 41.7 mg/dL     Total Cholesterol: 234 mg/dL      Social History   Tobacco Use  Smoking Status Never  Smokeless Tobacco Never   BP Readings from Last 3 Encounters:  01/28/21 124/68  01/16/21 140/75  12/30/20 128/76   Pulse Readings from Last 3 Encounters:  01/28/21 81  01/16/21 61  12/30/20 63   Wt Readings from Last 3 Encounters:  01/28/21 165 lb (74.8 kg)  01/16/21 160 lb 0.9 oz (72.6 kg)  12/30/20 167 lb 3.2 oz (75.8 kg)    Assessment: Review of patient past medical history, allergies, medications, health status, including review of consultants reports, laboratory and  other test data, was performed as part of comprehensive evaluation and provision of chronic care management services.   SDOH:  (Social Determinants of Health) assessments and interventions performed:  SDOH Interventions    Flowsheet Row Most Recent Value  SDOH Interventions   Financial Strain Interventions Other (Comment)       CCM Care Plan  No Known Allergies  Medications Reviewed Today     Reviewed by De Hollingshead, RPH-CPP (Pharmacist) on 02/20/21 at 1458  Med List Status: <None>   Medication Order Taking? Sig Documenting Provider Last Dose Status Informant           No Medications to Display                            Patient Active Problem List   Diagnosis Date Noted   Cough 01/28/2021   Facial trauma 10/24/2020   Fall 10/24/2020   Rash 06/30/2020   Breast lump on left side at 12  o'clock position 01/15/2020   Plantar warts 10/08/2019   Shoulder pain 10/03/2019   Decreased GFR 10/03/2019   Throat clearing 07/02/2019   Knee pain 12/10/2018   ADHD 11/06/2018   Ear pain 06/19/2018   Pre-op evaluation 08/30/2017   Left shoulder pain 02/25/2017   Skin lesion 12/04/2016   Elevated blood pressure reading 07/20/2016   Skin lesion of face 12/19/2015   BPV (benign positional vertigo) 02/03/2015   Lung nodule 02/03/2015   History of colonic polyps 02/03/2015   Health care maintenance 08/18/2014   Hypercholesterolemia 05/13/2014   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   Anemia 05/13/2014   Hyperglycemia 05/13/2014    Immunization History  Administered Date(s) Administered   Fluad Quad(high Dose 65+) 03/21/2019   Influenza Whole 06/22/2016   Influenza, High Dose Seasonal PF 04/08/2020   Influenza,inj,Quad PF,6+ Mos 05/08/2014, 05/22/2015   Influenza-Unspecified 04/05/2013, 03/18/2017, 04/26/2018   PFIZER(Purple Top)SARS-COV-2 Vaccination 08/27/2019, 09/20/2019, 05/09/2020   PPD Test 08/29/2014   Pneumococcal Conjugate-13 09/01/2016   Pneumococcal Polysaccharide-23 03/24/2018   Tdap 07/04/2008   Zoster, Live 01/23/2011    Conditions to be addressed/monitored: HTN and HLD  Care Plan : Medication Management  Updates made by De Hollingshead, RPH-CPP since 02/24/2021 12:00 AM     Problem: Hyperlipidemia, HTN, pre-diabetes      Long-Range Goal: Disease Progression Prevention   Start Date: 02/20/2021  This Visit's Progress: On track  Recent Progress: On track  Priority: High  Note:   Current Barriers:  Unable to achieve control of cholesterol   Pharmacist Clinical Goal(s):  Over the next 90 days, patient will achieve control of cholesterol as evidenced by LDL  through collaboration with PharmD and provider.    Interventions: 1:1 collaboration with [name], MD regarding development and update of comprehensive plan of care as evidenced by provider  attestation and co-signature Inter-disciplinary care team collaboration (see longitudinal plan of care) Comprehensive medication review performed; medication list updated in electronic medical record  Hyperlipidemia: Uncontrolled; current treatment: none Medications previously tried: rosuvastatin 10 mg, atorvastatin 20 mg, pravastatin 20 mg, ezetimibe 10 mg - - bilateral muscle pain that started with medication initiation and resolved with medication discontinuation Repatha PA APPROVED through 05/21/21 (3 months then subsequent should be for a year). Educated on [place] administration. Patient completed first injection in office.   Hyperglycemia/Prediabetes: A1c <6.5%; diet controlled Continue current lifestyle    Patient Goals/Self-Care Activities Over the next 90 days, patient will:  - take medications as  prescribed  Follow Up Plan: Telephone follow up appointment with care management team member scheduled for: ~ 6 weeks      Medication Assistance: None required.  Patient affirms current coverage meets needs.  Patient's preferred pharmacy [facility]:  [id number] [place] [place]Phone: [phone]: [phone]   Follow Up:  Patient agrees to Care Plan and Follow-up.  Plan: Telephone follow up appointment with care management team member scheduled for:  6 weeks  Catie TDarnelle Maffucci PharmD, BL[place] CPP Clinical Pharmacist LPfl[place]at [phone] Medication Samples have been provided to the patient.  Drug name: Repatha       Strength: 140 mg        Qty: 2 pens  LOT: [id number] Exp.Date: 05/2023

## 2021-02-24 NOTE — Patient Instructions (Addendum)
It was great to see you today!  Continue Repatha 140 mg (1 pen) every 14 days. Your next dose will be August 29th.   I scheduled a follow up call in about 6 weeks. Let me know if you have any questions or concerns.   Take care!  [name], PharmD [phone]  Visit Information  PATIENT GOALS:  Goals Addressed               This Visit's Progress     Patient Stated     Medication Monitoring (pt-stated)        Patient Goals/Self-Care Activities Over the next 90 days, patient will:  - take medications as prescribed        Patient verbalizes understanding of instructions provided today and agrees to view in [place].    Plan: Telephone follow up appointment with care management team member scheduled for:  6 weeks   [name], PharmD, [place], [place] Clinical Pharmacist [employer] at [employer] [phone]

## 2021-03-12 DIAGNOSIS — E7800 Pure hypercholesterolemia, unspecified: Secondary | ICD-10-CM | POA: Diagnosis not present

## 2021-03-12 DIAGNOSIS — M25511 Pain in right shoulder: Secondary | ICD-10-CM | POA: Diagnosis not present

## 2021-03-12 DIAGNOSIS — M25611 Stiffness of right shoulder, not elsewhere classified: Secondary | ICD-10-CM | POA: Diagnosis not present

## 2021-03-12 DIAGNOSIS — R944 Abnormal results of kidney function studies: Secondary | ICD-10-CM | POA: Diagnosis not present

## 2021-03-19 ENCOUNTER — Encounter: Payer: Self-pay | Admitting: Internal Medicine

## 2021-03-19 ENCOUNTER — Ambulatory Visit: Payer: PPO | Admitting: Pharmacist

## 2021-03-19 DIAGNOSIS — R739 Hyperglycemia, unspecified: Secondary | ICD-10-CM

## 2021-03-19 DIAGNOSIS — E7800 Pure hypercholesterolemia, unspecified: Secondary | ICD-10-CM

## 2021-03-19 DIAGNOSIS — M791 Myalgia, unspecified site: Secondary | ICD-10-CM

## 2021-03-19 DIAGNOSIS — T466X5A Adverse effect of antihyperlipidemic and antiarteriosclerotic drugs, initial encounter: Secondary | ICD-10-CM

## 2021-03-19 MED ORDER — REPATHA SURECLICK 140 MG/ML ~~LOC~~ SOAJ: 140.0000 mg | SUBCUTANEOUS | 1 refills | Status: DC

## 2021-03-19 NOTE — Patient Instructions (Signed)
Visit Information  PATIENT GOALS:  Goals Addressed               This Visit's Progress     Patient Stated     Medication Monitoring (pt-stated)        Patient Goals/Self-Care Activities Over the next 90 days, patient will:  - take medications as prescribed         Patient verbalizes understanding of instructions provided today and agrees to view in [place].  Plan: Telephone follow up appointment with care management team member scheduled for:  ~4 weeks as previously scheduled  [name], PharmD, [place], [place] Clinical Pharmacist [employer] at [facility] [phone]

## 2021-03-19 NOTE — Chronic Care Management (AMB) (Signed)
Chronic Care Management Pharmacy Note  03/19/2021 Name:  Elijah Walker MRN:  [id number] DOB:  06/08/1951   Subjective: Elijah Walker is an 70 y.o. year old male who is a primary patient of [name], MD.  The CCM team was consulted for assistance with disease management and care coordination needs.    Care coordination for  medication access  in response to provider referral for pharmacy case management and/or care coordination services.   Consent to Services:  The patient was given information about Chronic Care Management services, agreed to services, and gave verbal consent prior to initiation of services.  Please see initial visit note for detailed documentation.   Patient Care Team: [name], MD as PCP - General (Internal Medicine) De Hollingshead, RPH-CPP (Pharmacist)   Objective:  Lab Results  Component Value Date   CREATININE 1.13 01/27/2021   CREATININE 1.02 12/30/2020   CREATININE 1.21 08/26/2020    Lab Results  Component Value Date   HGBA1C 6.2 01/27/2021   Last diabetic Eye exam: No results found for: HMDIABEYEEXA  Last diabetic Foot exam: No results found for: HMDIABFOOTEX      Component Value Date/Time   CHOL 234 (H) 01/27/2021 0934   TRIG 141.0 01/27/2021 0934   HDL 41.70 01/27/2021 0934   CHOLHDL 6 01/27/2021 0934   VLDL 28.2 01/27/2021 0934   LDLCALC 165 (H) 01/27/2021 0934    Hepatic Function Latest Ref Rng & Units 01/27/2021 08/26/2020 04/24/2020  Total Protein 6.0 - 8.3 g/dL 6.6 6.9 6.7  Albumin 3.5 - 5.2 g/dL 4.3 4.1 4.2  AST 0 - 37 U/L _0 ALT 0 - 53 U/L _1 Alk Phosphatase 39 - 117 U/L 95 58 67  Total Bilirubin 0.2 - 1.2 mg/dL 1.2 0.7 0.8  Bilirubin, Direct 0.0 - 0.3 mg/dL 0.2 0.1 0.1    Lab Results  Component Value Date/Time   TSH 2.71 08/26/2020 08:13 AM   TSH 2.53 06/28/2019 08:00 AM    CBC Latest Ref Rng & Units 12/30/2020 04/24/2020 06/28/2019  WBC 4.0 - 10.5 K/uL 6.1 4.2 4.4  Hemoglobin 13.0 -  17.0 g/dL 16.1 15.2 14.9  Hematocrit 39.0 - 52.0 % 48.2 45.2 44.3  Platelets 150.0 - 400.0 K/uL 234.0 210.0 226.0    No results found for: VD25OH  Clinical ASCVD: No  The 10-year ASCVD risk score Mikey Bussing DC Jr., et al., 2013) is: 18.9%   Values used to calculate the score:     Age: 1 years     Sex: Male     Is Non-Hispanic African American: No     Diabetic: No     Tobacco smoker: No     Systolic Blood Pressure: 993 mmHg     Is BP treated: No     HDL Cholesterol: 41.7 mg/dL     Total Cholesterol: 234 mg/dL      Social History   Tobacco Use  Smoking Status Never  Smokeless Tobacco Never   BP Readings from Last 3 Encounters:  01/28/21 124/68  01/16/21 140/75  12/30/20 128/76   Pulse Readings from Last 3 Encounters:  01/28/21 81  01/16/21 61  12/30/20 63   Wt Readings from Last 3 Encounters:  01/28/21 165 lb (74.8 kg)  01/16/21 160 lb 0.9 oz (72.6 kg)  12/30/20 167 lb 3.2 oz (75.8 kg)    Assessment: Review of patient past medical history, allergies, medications, health status, including review of consultants reports, laboratory and other test  data, was performed as part of comprehensive evaluation and provision of chronic care management services.   SDOH:  (Social Determinants of Health) assessments and interventions performed:  SDOH Interventions    Flowsheet Row Most Recent Value  SDOH Interventions   Financial Strain Interventions Intervention Not Indicated       CCM Care Plan  No Known Allergies  Medications Reviewed Today     Reviewed by De Hollingshead, RPH-CPP (Pharmacist) on 02/20/21 at 1458  Med List Status: <None>   Medication Order Taking? Sig Documenting Provider Last Dose Status Informant           No Medications to Display                            Patient Active Problem List   Diagnosis Date Noted   Cough 01/28/2021   Facial trauma 10/24/2020   Fall 10/24/2020   Rash 06/30/2020   Breast lump on left side at 12 o'clock  position 01/15/2020   Plantar warts 10/08/2019   Shoulder pain 10/03/2019   Decreased GFR 10/03/2019   Throat clearing 07/02/2019   Knee pain 12/10/2018   ADHD 11/06/2018   Ear pain 06/19/2018   Pre-op evaluation 08/30/2017   Left shoulder pain 02/25/2017   Skin lesion 12/04/2016   Elevated blood pressure reading 07/20/2016   Skin lesion of face 12/19/2015   BPV (benign positional vertigo) 02/03/2015   Lung nodule 02/03/2015   History of colonic polyps 02/03/2015   Health care maintenance 08/18/2014   Hypercholesterolemia 05/13/2014   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   Anemia 05/13/2014   Hyperglycemia 05/13/2014    Immunization History  Administered Date(s) Administered   Fluad Quad(high Dose 65+) 03/21/2019   Influenza Whole 06/22/2016   Influenza, High Dose Seasonal PF 04/08/2020   Influenza,inj,Quad PF,6+ Mos 05/08/2014, 05/22/2015   Influenza-Unspecified 04/05/2013, 03/18/2017, 04/26/2018   PFIZER(Purple Top)SARS-COV-2 Vaccination 08/27/2019, 09/20/2019, 05/09/2020   PPD Test 08/29/2014   Pneumococcal Conjugate-13 09/01/2016   Pneumococcal Polysaccharide-23 03/24/2018   Tdap 07/04/2008   Zoster, Live 01/23/2011    Conditions to be addressed/monitored: HLD  Care Plan : Medication Management  Updates made by De Hollingshead, RPH-CPP since 03/19/2021 12:00 AM     Problem: Hyperlipidemia, HTN, pre-diabetes      Long-Range Goal: Disease Progression Prevention   Start Date: 02/20/2021  Recent Progress: On track  Priority: High  Note:   Current Barriers:  Unable to achieve control of cholesterol   Pharmacist Clinical Goal(s):  Over the next 90 days, patient will achieve control of cholesterol as evidenced by LDL  through collaboration with PharmD and provider.    Interventions: 1:1 collaboration with [name], MD regarding development and update of comprehensive plan of care as evidenced by provider attestation and co-signature Inter-disciplinary care  team collaboration (see longitudinal plan of care) Comprehensive medication review performed; medication list updated in electronic medical record  Hyperlipidemia: Uncontrolled; current treatment: Repatha 140 mg Q14 days Medications previously tried: rosuvastatin 10 mg, atorvastatin 20 mg, pravastatin 20 mg, ezetimibe 10 mg - bilateral muscle pain that started with medication initiation and resolved with medication discontinuation Repatha PA APPROVED through 05/21/21 (3 months then subsequent should be for a year). Prescription for Repatha sent to the pharmacy. Patient notified.   Hyperglycemia/Prediabetes: A1c <6.5%; diet controlled Continue current lifestyle    Patient Goals/Self-Care Activities Over the next 90 days, patient will:  - take medications as prescribed  Follow Up Plan: Telephone follow  up appointment with care management team member scheduled for: ~ 4 weeks as previously scheduled      Medication Assistance: None required.  Patient affirms current coverage meets needs.  Patient[facility] preferred pharmacy [facility]  [id number] [place] [place]Phone: [phone]: [phone]  Follow Up:  Patient agrees to Care Plan and Follow-up.  Plan: Telephone follow up appointment with care management team member scheduled for:  ~4 weeks as previously scheduled  Catie TDarnelle Maffucci PharmD, [place]Clinical Pharmacist LOccidental [phone]

## 2021-03-20 ENCOUNTER — Telehealth: Payer: Self-pay | Admitting: Internal Medicine

## 2021-03-20 MED ORDER — REPATHA SURECLICK 140 MG/ML ~~LOC~~ SOAJ: 140.0000 mg | SUBCUTANEOUS | 1 refills | Status: DC

## 2021-03-20 NOTE — Addendum Note (Signed)
Addended by: Alphonzo Severance E on: 03/20/2021 12:30 PM   Modules accepted: Orders

## 2021-03-20 NOTE — Telephone Encounter (Signed)
Patient called and he said that [facility] did not get the prescription for Evolocumab (REPATHA SURECLICK) [id number] MG/ML SOAJ.

## 2021-03-20 NOTE — Telephone Encounter (Signed)
Resent order.

## 2021-04-01 DIAGNOSIS — M25511 Pain in right shoulder: Secondary | ICD-10-CM | POA: Diagnosis not present

## 2021-04-01 DIAGNOSIS — M25611 Stiffness of right shoulder, not elsewhere classified: Secondary | ICD-10-CM | POA: Diagnosis not present

## 2021-04-03 DIAGNOSIS — D2261 Melanocytic nevi of right upper limb, including shoulder: Secondary | ICD-10-CM | POA: Diagnosis not present

## 2021-04-03 DIAGNOSIS — L570 Actinic keratosis: Secondary | ICD-10-CM | POA: Diagnosis not present

## 2021-04-03 DIAGNOSIS — D2271 Melanocytic nevi of right lower limb, including hip: Secondary | ICD-10-CM | POA: Diagnosis not present

## 2021-04-03 DIAGNOSIS — L821 Other seborrheic keratosis: Secondary | ICD-10-CM | POA: Diagnosis not present

## 2021-04-03 DIAGNOSIS — D2272 Melanocytic nevi of left lower limb, including hip: Secondary | ICD-10-CM | POA: Diagnosis not present

## 2021-04-03 DIAGNOSIS — D485 Neoplasm of uncertain behavior of skin: Secondary | ICD-10-CM | POA: Diagnosis not present

## 2021-04-03 DIAGNOSIS — D2262 Melanocytic nevi of left upper limb, including shoulder: Secondary | ICD-10-CM | POA: Diagnosis not present

## 2021-04-03 DIAGNOSIS — D225 Melanocytic nevi of trunk: Secondary | ICD-10-CM | POA: Diagnosis not present

## 2021-04-09 DIAGNOSIS — M25511 Pain in right shoulder: Secondary | ICD-10-CM | POA: Diagnosis not present

## 2021-04-09 DIAGNOSIS — M25611 Stiffness of right shoulder, not elsewhere classified: Secondary | ICD-10-CM | POA: Diagnosis not present

## 2021-04-10 ENCOUNTER — Ambulatory Visit (INDEPENDENT_AMBULATORY_CARE_PROVIDER_SITE_OTHER): Payer: PPO | Admitting: Pharmacist

## 2021-04-10 DIAGNOSIS — T466X5A Adverse effect of antihyperlipidemic and antiarteriosclerotic drugs, initial encounter: Secondary | ICD-10-CM

## 2021-04-10 DIAGNOSIS — M791 Myalgia, unspecified site: Secondary | ICD-10-CM

## 2021-04-10 DIAGNOSIS — R739 Hyperglycemia, unspecified: Secondary | ICD-10-CM

## 2021-04-10 DIAGNOSIS — E7800 Pure hypercholesterolemia, unspecified: Secondary | ICD-10-CM

## 2021-04-10 NOTE — Patient Instructions (Signed)
Visit Information  PATIENT GOALS:  Goals Addressed               This Visit's Progress     Patient Stated     Medication Monitoring (pt-stated)        Patient Goals/Self-Care Activities Over the next 90 days, patient will:  - take medications as prescribed        Patient verbalizes understanding of instructions provided today and agrees to view in [place].   Plan: Face to Face appointment with care management team member scheduled for: 8 weeks  [name], PharmD, [place], [place] Clinical Pharmacist [employer] at [employer] [phone]

## 2021-04-10 NOTE — Chronic Care Management (AMB) (Signed)
Chronic Care Management Pharmacy Note  04/10/2021 Name:  Elijah Walker MRN:  [id number] DOB:  April 23, 1951  Subjective: Elijah Walker is an 70 y.o. year old male who is a primary patient of Elijah Pheasant, MD.  The CCM team was consulted for assistance with disease management and care coordination needs.    Engaged with patient by telephone for follow up visit in response to provider referral for pharmacy case management and/or care coordination services.   Consent to Services:  The patient was given information about Chronic Care Management services, agreed to services, and gave verbal consent prior to initiation of services.  Please see initial visit note for detailed documentation.   Patient Care Team: Elijah Pheasant, MD as PCP - General (Internal Medicine) De Hollingshead, RPH-CPP (Pharmacist)    Objective:  Lab Results  Component Value Date   CREATININE 1.13 01/27/2021   CREATININE 1.02 12/30/2020   CREATININE 1.21 08/26/2020    Lab Results  Component Value Date   HGBA1C 6.2 01/27/2021   Last diabetic Eye exam: No results found for: HMDIABEYEEXA  Last diabetic Foot exam: No results found for: HMDIABFOOTEX      Component Value Date/Time   CHOL 234 (H) 01/27/2021 0934   TRIG 141.0 01/27/2021 0934   HDL 41.70 01/27/2021 0934   CHOLHDL 6 01/27/2021 0934   VLDL 28.2 01/27/2021 0934   LDLCALC 165 (H) 01/27/2021 0934    Hepatic Function Latest Ref Rng & Units 01/27/2021 08/26/2020 04/24/2020  Total Protein 6.0 - 8.3 g/dL 6.6 6.9 6.7  Albumin 3.5 - 5.2 g/dL 4.3 4.1 4.2  AST 0 - 37 U/L '19 17 18  ' ALT 0 - 53 U/L '26 25 26  ' Alk Phosphatase 39 - 117 U/L 95 58 67  Total Bilirubin 0.2 - 1.2 mg/dL 1.2 0.7 0.8  Bilirubin, Direct 0.0 - 0.3 mg/dL 0.2 0.1 0.1    Lab Results  Component Value Date/Time   TSH 2.71 08/26/2020 08:13 AM   TSH 2.53 06/28/2019 08:00 AM    CBC Latest Ref Rng & Units 12/30/2020 04/24/2020 06/28/2019  WBC 4.0 - 10.5 K/uL 6.1 4.2 4.4   Hemoglobin 13.0 - 17.0 g/dL 16.1 15.2 14.9  Hematocrit 39.0 - 52.0 % 48.2 45.2 44.3  Platelets 150.0 - 400.0 K/uL 234.0 210.0 226.0     Social History   Tobacco Use  Smoking Status Never  Smokeless Tobacco Never   BP Readings from Last 3 Encounters:  01/28/21 124/68  01/16/21 140/75  12/30/20 128/76   Pulse Readings from Last 3 Encounters:  01/28/21 81  01/16/21 61  12/30/20 63   Wt Readings from Last 3 Encounters:  01/28/21 165 lb (74.8 kg)  01/16/21 160 lb 0.9 oz (72.6 kg)  12/30/20 167 lb 3.2 oz (75.8 kg)    Assessment: Review of patient past medical history, allergies, medications, health status, including review of consultants reports, laboratory and other test data, was performed as part of comprehensive evaluation and provision of chronic care management services.   SDOH:  (Social Determinants of Health) assessments and interventions performed:    CCM Care Plan  No Known Allergies  Medications Reviewed Today     Reviewed by De Hollingshead, RPH-CPP (Pharmacist) on 04/10/21 at 1442  Med List Status: <None>   Medication Order Taking? Sig Documenting Provider Last Dose Status Informant  Evolocumab (REPATHA SURECLICK) 607 MG/ML SOAJ [id number] Yes Inject 140 mg into the skin every 14 (fourteen) days. Elijah Pheasant, MD Taking Active  Patient Active Problem List   Diagnosis Date Noted   Cough 01/28/2021   Facial trauma 10/24/2020   Fall 10/24/2020   Rash 06/30/2020   Breast lump on left side at 12 o'clock position 01/15/2020   Plantar warts 10/08/2019   Shoulder pain 10/03/2019   Decreased GFR 10/03/2019   Throat clearing 07/02/2019   Knee pain 12/10/2018   ADHD 11/06/2018   Ear pain 06/19/2018   Pre-op evaluation 08/30/2017   Left shoulder pain 02/25/2017   Skin lesion 12/04/2016   Elevated blood pressure reading 07/20/2016   Skin lesion of face 12/19/2015   BPV (benign positional vertigo) 02/03/2015   Lung nodule 02/03/2015    History of colonic polyps 02/03/2015   Health care maintenance 08/18/2014   Hypercholesterolemia 05/13/2014   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   Anemia 05/13/2014   Hyperglycemia 05/13/2014    Immunization History  Administered Date(s) Administered   Fluad Quad(high Dose 65+) 03/21/2019   Influenza Whole 06/22/2016   Influenza, High Dose Seasonal PF 04/08/2020   Influenza,inj,Quad PF,6+ Mos 05/08/2014, 05/22/2015   Influenza-Unspecified 04/05/2013, 03/18/2017, 04/26/2018   PFIZER(Purple Top)SARS-COV-2 Vaccination 08/27/2019, 09/20/2019, 05/09/2020   PPD Test 08/29/2014   Pfizer Covid-19 Vaccine Bivalent Booster 65yr & up 03/21/2021   Pneumococcal Conjugate-13 09/01/2016   Pneumococcal Polysaccharide-23 03/24/2018   Tdap 07/04/2008   Zoster, Live 01/23/2011    Conditions to be addressed/monitored: HTN and HLD  Care Plan : Medication Management  Updates made by TDe Hollingshead RPH-CPP since 04/10/2021 12:00 AM     Problem: Hyperlipidemia, HTN, pre-diabetes      Long-Range Goal: Disease Progression Prevention   Start Date: 02/20/2021  This Visit's Progress: On track  Recent Progress: On track  Priority: High  Note:   Current Barriers:  Unable to achieve control of cholesterol   Pharmacist Clinical Goal(s):  Over the next 90 days, patient will achieve control of cholesterol as evidenced by LDL  through collaboration with PharmD and provider.    Interventions: 1:1 collaboration with SEinar Pheasant MD regarding development and update of comprehensive plan of care as evidenced by provider attestation and co-signature Inter-disciplinary care team collaboration (see longitudinal plan of care) Comprehensive medication review performed; medication list updated in electronic medical record  Health Maintenance Yearly influenza vaccination: due - he plans to get at his local pharmacy in the next several weeks Td/Tdap vaccination: due - recommend to discuss moving  forward Pneumonia vaccination: up to date  COVID vaccinations: up to date - received bivalent booster Shingrix vaccinations: due - recommend moving forward Colonoscopy: up to date  Hyperlipidemia: Uncontrolled but ; current treatment: Repatha 140 mg Q14 days Denies any side effects since starting Repatha therapy.  Medications previously tried: rosuvastatin 10 mg, atorvastatin 20 mg, pravastatin 20 mg, ezetimibe 10 mg - bilateral muscle pain that started with medication initiation and resolved with medication discontinuation Repatha PA APPROVED through 05/21/21 (3 months then subsequent should be for a year). Recommended to continue current regimen at this time. Follow up lipid panel with regularly scheduled appointment.   Hyperglycemia/Prediabetes: A1c <6.5%; diet controlled Continue current lifestyle    Patient Goals/Self-Care Activities Over the next 90 days, patient will:  - take medications as prescribed  Follow Up Plan: Face to Face appointment with care management team member scheduled for:  ~ 8 weeks      Medication Assistance: None required.  Patient affirms current coverage meets needs.  [id number] [place]  [place] [address] Phone: [phone] Fax: [phone]  Follow Up:  Patient agrees to Care Plan and Follow-up.  Plan: Face to Face appointment with care management team member scheduled for: 8 weeks  [name], PharmD, [place], [place] Clinical Pharmacist [employer] at [employer] [phone]

## 2021-04-11 DIAGNOSIS — E7800 Pure hypercholesterolemia, unspecified: Secondary | ICD-10-CM | POA: Diagnosis not present

## 2021-04-18 DIAGNOSIS — M25511 Pain in right shoulder: Secondary | ICD-10-CM | POA: Diagnosis not present

## 2021-04-18 DIAGNOSIS — M25611 Stiffness of right shoulder, not elsewhere classified: Secondary | ICD-10-CM | POA: Diagnosis not present

## 2021-04-25 DIAGNOSIS — M25511 Pain in right shoulder: Secondary | ICD-10-CM | POA: Diagnosis not present

## 2021-04-25 DIAGNOSIS — M25611 Stiffness of right shoulder, not elsewhere classified: Secondary | ICD-10-CM | POA: Diagnosis not present

## 2021-05-02 DIAGNOSIS — M5386 Other specified dorsopathies, lumbar region: Secondary | ICD-10-CM | POA: Diagnosis not present

## 2021-05-02 DIAGNOSIS — M9903 Segmental and somatic dysfunction of lumbar region: Secondary | ICD-10-CM | POA: Diagnosis not present

## 2021-05-03 DIAGNOSIS — M9903 Segmental and somatic dysfunction of lumbar region: Secondary | ICD-10-CM | POA: Diagnosis not present

## 2021-05-03 DIAGNOSIS — M5386 Other specified dorsopathies, lumbar region: Secondary | ICD-10-CM | POA: Diagnosis not present

## 2021-05-05 DIAGNOSIS — M5386 Other specified dorsopathies, lumbar region: Secondary | ICD-10-CM | POA: Diagnosis not present

## 2021-05-05 DIAGNOSIS — M9903 Segmental and somatic dysfunction of lumbar region: Secondary | ICD-10-CM | POA: Diagnosis not present

## 2021-05-07 DIAGNOSIS — M25511 Pain in right shoulder: Secondary | ICD-10-CM | POA: Diagnosis not present

## 2021-05-07 DIAGNOSIS — M25611 Stiffness of right shoulder, not elsewhere classified: Secondary | ICD-10-CM | POA: Diagnosis not present

## 2021-05-20 DIAGNOSIS — M5386 Other specified dorsopathies, lumbar region: Secondary | ICD-10-CM | POA: Diagnosis not present

## 2021-05-20 DIAGNOSIS — M9903 Segmental and somatic dysfunction of lumbar region: Secondary | ICD-10-CM | POA: Diagnosis not present

## 2021-05-23 ENCOUNTER — Telehealth: Payer: Self-pay | Admitting: Pharmacist

## 2021-05-23 DIAGNOSIS — E7800 Pure hypercholesterolemia, unspecified: Secondary | ICD-10-CM

## 2021-05-23 DIAGNOSIS — M9903 Segmental and somatic dysfunction of lumbar region: Secondary | ICD-10-CM | POA: Diagnosis not present

## 2021-05-23 DIAGNOSIS — M5386 Other specified dorsopathies, lumbar region: Secondary | ICD-10-CM | POA: Diagnosis not present

## 2021-05-23 NOTE — Telephone Encounter (Signed)
PA completed for Repatha (key [id number])

## 2021-05-26 ENCOUNTER — Ambulatory Visit (INDEPENDENT_AMBULATORY_CARE_PROVIDER_SITE_OTHER): Payer: PPO

## 2021-05-26 VITALS — Ht 68.0 in | Wt 165.0 lb

## 2021-05-26 DIAGNOSIS — Z0000 Encounter for general adult medical examination without abnormal findings: Secondary | ICD-10-CM

## 2021-05-26 MED ORDER — REPATHA SURECLICK 140 MG/ML ~~LOC~~ SOAJ: 140.0000 mg | SUBCUTANEOUS | 1 refills | Status: DC

## 2021-05-26 NOTE — Telephone Encounter (Signed)
PA Approved through 05/23/2022.

## 2021-05-26 NOTE — Progress Notes (Signed)
Subjective:   Kaiyu Mirabal is a 70 y.o. male who presents for Medicare Annual/Subsequent preventive examination.  Review of Systems    No ROS.  Medicare Wellness Virtual Visit.  Visual/audio telehealth visit, UTA vital signs.   See social history for additional risk factors.   Cardiac Risk Factors include: advanced age (>22men, >4 women);male gender     Objective:    Today's Vitals   05/26/21 0827  Weight: 165 lb (74.8 kg)  Height: 5\' 8"  (1.727 m)   Body mass index is 25.09 kg/m.  Advanced Directives 05/26/2021 01/16/2021 01/06/2021 05/23/2020 05/23/2019 05/17/2018 09/14/2017  Does Patient Have a Medical Advance Directive? Yes No No Yes No No No  Type of Paramedic of [place];Living will - - [place];Living will - - -  Does patient want to make changes to medical advance directive? No - Patient declined - - No - Patient declined - - -  Copy of [place] in Chart? No - copy requested - - No - copy requested - - -  Would patient like information on creating a medical advance directive? - No - Patient declined No - Patient declined - No - Patient declined No - Patient declined No - Patient declined    Current Medications (verified) Outpatient Encounter Medications as of 05/26/2021  Medication Sig   Evolocumab (REPATHA SURECLICK) 440 MG/ML SOAJ Inject 140 mg into the skin every 14 (fourteen) days.   No facility-administered encounter medications on file as of 05/26/2021.    Allergies (verified) Patient has no known allergies.   History: Past Medical History:  Diagnosis Date   Blood in stool    H/O   History of chicken pox    Hyperlipidemia    PONV (postoperative nausea and vomiting)    Past Surgical History:  Procedure Laterality Date   CHOLECYSTECTOMY  1998   COLONOSCOPY     SHOULDER ARTHROSCOPY WITH BANKART REPAIR Left 09/14/2017   Procedure: SHOULDER ARTHROSCOPY WITH MINI OPEN ROTATOR CUFF REPAIR,  SUBACROMINAL DECOMPRESSION, LABRAL REPAIR,BICEP TENOTOMY, DISTAL CLAVICLE EXCISION ;  Surgeon: Thornton Park, MD;  Location: [facility];  Service: Orthopedics;  Laterality: Left;   SHOULDER ARTHROSCOPY WITH OPEN ROTATOR CUFF REPAIR AND DISTAL CLAVICLE ACROMINECTOMY Right 01/16/2021   Procedure: RIGHT SHOULDER ARTHROSCOPY WITH MINI-OPEN ROTATOR CUFF REPAIR, SUBACCROMINAL DECOMPRESSION AND DISTAL CLAVICLE EXCISION;  Surgeon: Thornton Park, MD;  Location: [facility];  Service: Orthopedics;  Laterality: Right;   Family History  Problem Relation Age of Onset   Dementia Mother    Diabetes Father    Colitis Sister    Autoimmune disease Brother    Colitis Daughter    Social History   Socioeconomic History   Marital status: Married    Spouse name: Not on file   Number of children: Not on file   Years of education: Not on file   Highest education level: Not on file  Occupational History   Not on file  Tobacco Use   Smoking status: Never   Smokeless tobacco: Never  Vaping Use   Vaping Use: Never used  Substance and Sexual Activity   Alcohol use: No    Alcohol/week: 0.0 standard drinks    Comment: occ   Drug use: No   Sexual activity: Yes  Other Topics Concern   Not on file  Social History Narrative   Not on file   Social Determinants of Health   Financial Resource Strain: Low Risk    Difficulty of Paying  Living Expenses: Not hard at all  Food Insecurity: No Food Insecurity   Worried About [place] in the Last Year: Never true   Ran Out of Food in the Last Year: Never true  Transportation Needs: No Transportation Needs   Lack of Transportation (Medical): No   Lack of Transportation (Non-Medical): No  Physical Activity: Sufficiently Active   Days of Exercise per Week: 4 days   Minutes of Exercise per Session: 150+ min  Stress: No Stress Concern Present   Feeling of Stress : Not at all  Social Connections: Unknown   Frequency of Communication with Friends and  Family: Not on file   Frequency of Social Gatherings with Friends and Family: Not on file   Attends Religious Services: Not on Electrical engineer or Organizations: Not on file   Attends Archivist Meetings: Not on file   Marital Status: Married    Tobacco Counseling Counseling given: Not Answered   Clinical Intake:  Pre-visit preparation completed: Yes        Diabetes: No  How often do you need to have someone help you when you read instructions, pamphlets, or other written materials from your doctor or pharmacy?: 1 - Never    Interpreter Needed?: No      Activities of Daily Living In your present state of health, do you have any difficulty performing the following activities: 05/26/2021 01/06/2021  Hearing? N -  Vision? N -  Difficulty concentrating or making decisions? N -  Walking or climbing stairs? N -  Dressing or bathing? N -  Doing errands, shopping? N N  Preparing Food and eating ? N -  Using the Toilet? N -  In the past six months, have you accidently leaked urine? N -  Do you have problems with loss of bowel control? N -  Managing your Medications? N -  Managing your Finances? Y -  Comment Wife mananges -  Housekeeping or managing your Housekeeping? N -  Some recent data might be hidden    Patient Care Team: [name], MD as PCP - General (Internal Medicine) De Hollingshead, RPH-CPP (Pharmacist)  Indicate any recent Medical Services you may have received from other than [facility] providers in the past year (date may be approximate).     Assessment:   This is a routine wellness examination for Nyan.  Virtual Visit via Telephone Note  I connected with  Kaire Stary on 05/26/21 at  8:15 AM EST by telephone and verified that I am speaking with the correct person using two identifiers.  Location: Patient: home Provider: office Persons participating in the virtual visit: patient/Nurse Health Advisor   I discussed  the limitations, risks, security and privacy concerns of performing an evaluation and management service by telephone and the availability of in person appointments. The patient expressed understanding and agreed to proceed.  Interactive audio and video telecommunications were attempted between this nurse and patient, however failed, due to patient having technical difficulties OR patient did not have access to video capability.  We continued and completed visit with audio only.  Some vital signs may be absent or patient reported.   Hearing/Vision screen Hearing Screening - Comments:: Patient is able to hear conversational tones without difficulty. No issues reported. Vision Screening - Comments:: Followed by [facility]  Wears corrective lenses  They have regular follow up with the ophthalmologist  Dietary issues and exercise activities discussed: Intensity: Mild Regular diet Good fluid intake  Goals Addressed               This Visit's Progress     Patient Stated     Low cholesterol/carb diet (pt-stated)        Stay active       Depression Screen PHQ 2/9 Scores 05/26/2021 05/23/2020 01/12/2020 06/29/2019 05/23/2019 03/23/2019 05/17/2018  PHQ - 2 Score 0 0 0 0 0 0 0  PHQ- 9 Score - - - 0 - 0 -    Fall Risk Fall Risk  05/26/2021 09/20/2020 05/23/2020 02/06/2020 01/12/2020  Falls in the past year? 0 0 0 0 0  Comment - - - Emmi Telephone Survey: data to providers prior to load -  Number falls in past yr: 0 0 0 - 0  Injury with Fall? 0 0 - - 0  Follow up Falls evaluation completed Falls evaluation completed Falls evaluation completed - Falls evaluation completed    [place]: Adequate lighting in your home to reduce risk of falls? Yes   ASSISTIVE DEVICES UTILIZED TO PREVENT FALLS:  Life alert? No  Use of a cane, walker or w/c? No  Grab bars in the bathroom? Yes  Shower chair or bench in shower? Yes  Elevated toilet seat or a handicapped  toilet? Yes   TIMED UP AND GO: Was the test performed? No .   Cognitive Function: Patient is alert and oriented x3. MMSE - Mini Mental State Exam 05/17/2018  Orientation to time 5  Orientation to Place 5  Registration 3  Attention/ Calculation 5  Recall 3  Language- name 2 objects 2  Language- repeat 1  Language- follow 3 step command 3  Language- read & follow direction 1  Write a sentence 1  Copy design 1  Total score 30     6CIT Screen 05/26/2021 05/23/2019  What Year? - 0 points  What month? - 0 points  What time? - 0 points  Count back from 20 - 0 points  Months in reverse 0 points 0 points  Repeat phrase - 0 points  Total Score - 0    Immunizations Immunization History  Administered Date(s) Administered   Fluad Quad(high Dose 65+) 03/21/2019   Influenza Whole 06/22/2016   Influenza, High Dose Seasonal PF 04/08/2020   Influenza,inj,Quad PF,6+ Mos 05/08/2014, 05/22/2015   Influenza-Unspecified 04/05/2013, 03/18/2017, 04/26/2018, 04/21/2021   PFIZER(Purple Top)SARS-COV-2 Vaccination 08/27/2019, 09/20/2019, 05/09/2020   PPD Test 08/29/2014   Pfizer Covid-19 Vaccine Bivalent Booster 37yrs & up 03/21/2021   Pneumococcal Conjugate-13 09/01/2016   Pneumococcal Polysaccharide-23 03/24/2018   Tdap 07/04/2008   Zoster, Live 01/23/2011   TDAP status: Due, Education has been provided regarding the importance of this vaccine. Advised may receive this vaccine at local pharmacy or Health Dept. Aware to provide a copy of the vaccination record if obtained from local pharmacy or Health Dept. Verbalized acceptance and understanding. Deferred.   Qualifies for Shingles Vaccine? Yes   Zostavax completed Yes   Shingrix Completed?: No.    Education has been provided regarding the importance of this vaccine. Patient has been advised to call insurance company to determine out of pocket expense if they have not yet received this vaccine. Advised may also receive vaccine at local  pharmacy or Health Dept. Verbalized acceptance and understanding.  Screening Tests Health Maintenance  Topic Date Due   Zoster Vaccines- Shingrix (1 of 2) Never done   TETANUS/TDAP  07/04/2018   COLONOSCOPY (Pts 45-81yrs Insurance coverage will need  to be confirmed)  11/01/2023   Pneumonia Vaccine 53+ Years old  Completed   INFLUENZA VACCINE  Completed   COVID-19 Vaccine  Completed   Hepatitis C Screening  Completed   HPV VACCINES  Aged Out    Health Maintenance  Health Maintenance Due  Topic Date Due   Zoster Vaccines- Shingrix (1 of 2) Never done   TETANUS/TDAP  07/04/2018    Colorectal cancer screening: Type of screening: Colonoscopy. Completed 2015. Repeat every 10 years  Lung Cancer Screening: (Low Dose CT Chest recommended if Age 83-80 years, 30 pack-year currently smoking OR have quit w/in 15years.) does not qualify.   Vision Screening: Recommended annual ophthalmology exams for early detection of glaucoma and other disorders of the eye.  Dental Screening: Recommended annual dental exams for proper oral hygiene.  Community Resource Referral / Chronic Care Management: CRR required this visit?  No   CCM required this visit?  No      Plan:   Keep all routine maintenance appointments.   I have personally reviewed and noted the following in the patient's chart:   Medical and social history Use of alcohol, tobacco or illicit drugs  Current medications and supplements including opioid prescriptions. Patient is not currently taking opioid prescriptions. Functional ability and status Nutritional status Physical activity Advanced directives List of other physicians Hospitalizations, surgeries, and ER visits in previous 12 months Vitals Screenings to include cognitive, depression, and falls Referrals and appointments  In addition, I have reviewed and discussed with patient certain preventive protocols, quality metrics, and best practice recommendations. A written  personalized care plan for preventive services as well as general preventive health recommendations were provided to patient.     Varney Biles, LPN   [id number]

## 2021-05-26 NOTE — Addendum Note (Signed)
Addended by: De Hollingshead on: 05/26/2021 03:54 PM   Modules accepted: Orders

## 2021-05-26 NOTE — Patient Instructions (Addendum)
[name] , Thank you for taking time to come for your Medicare Wellness Visit. I appreciate your ongoing commitment to your health goals. Please review the following plan we discussed and let me know if I can assist you in the future.   These are the goals we discussed:  Goals       Patient Stated     Low cholesterol/carb diet (pt-stated)      Stay active      Medication Monitoring (pt-stated)      Patient Goals/Self-Care Activities Over the next 90 days, patient will:  - take medications as prescribed        This is a list of the screening recommended for you and due dates:  Health Maintenance  Topic Date Due   Zoster (Shingles) Vaccine (1 of 2) Never done   Tetanus Vaccine  07/04/2018   Colon Cancer Screening  11/01/2023   Pneumonia Vaccine  Completed   Flu Shot  Completed   COVID-19 Vaccine  Completed   Hepatitis C Screening: USPSTF Recommendation to screen - Ages 18-79 yo.  Completed   HPV Vaccine  Aged Out    Advanced directives: End of life planning; Advance aging; Advanced directives discussed.  Copy of current HCPOA/Living Will requested.    Next appointment: Follow up in one year for your annual wellness visit.   Preventive Care 14 Years and Older, Male Preventive care refers to lifestyle choices and visits with your health care provider that can promote health and wellness. What does preventive care include? A yearly physical exam. This is also called an annual well check. Dental exams once or twice a year. Routine eye exams. Ask your health care provider how often you should have your eyes checked. Personal lifestyle choices, including: Daily care of your teeth and gums. Regular physical activity. Eating a healthy diet. Avoiding tobacco and drug use. Limiting alcohol use. Practicing safe sex. Taking low doses of aspirin every day. Taking vitamin and mineral supplements as recommended by your health care provider. What happens during an annual well  check? The services and screenings done by your health care provider during your annual well check will depend on your age, overall health, lifestyle risk factors, and family history of disease. Counseling  Your health care provider may ask you questions about your: Alcohol use. Tobacco use. Drug use. Emotional well-being. Home and relationship well-being. Sexual activity. Eating habits. History of falls. Memory and ability to understand (cognition). Work and work Statistician. Screening  You may have the following tests or measurements: Height, weight, and BMI. Blood pressure. Lipid and cholesterol levels. These may be checked every 5 years, or more frequently if you are over 24 years old. Skin check. Lung cancer screening. You may have this screening every year starting at age 10 if you have a 30-pack-year history of smoking and currently smoke or have quit within the past 15 years. Fecal occult blood test (FOBT) of the stool. You may have this test every year starting at age 28. Flexible sigmoidoscopy or colonoscopy. You may have a sigmoidoscopy every 5 years or a colonoscopy every 10 years starting at age 34. Prostate cancer screening. Recommendations will vary depending on your family history and other risks. Hepatitis C blood test. Hepatitis B blood test. Sexually transmitted disease (STD) testing. Diabetes screening. This is done by checking your blood sugar (glucose) after you have not eaten for a while (fasting). You may have this done every 1-3 years. Abdominal aortic aneurysm (AAA) screening. You  may need this if you are a current or former smoker. Osteoporosis. You may be screened starting at age 57 if you are at high risk. Talk with your health care provider about your test results, treatment options, and if necessary, the need for more tests. Vaccines  Your health care provider may recommend certain vaccines, such as: Influenza vaccine. This is recommended every  year. Tetanus, diphtheria, and acellular pertussis (Tdap, Td) vaccine. You may need a Td booster every 10 years. Zoster vaccine. You may need this after age 30. Pneumococcal 13-valent conjugate (PCV13) vaccine. One dose is recommended after age 82. Pneumococcal polysaccharide (PPSV23) vaccine. One dose is recommended after age 42. Talk to your health care provider about which screenings and vaccines you need and how often you need them. This information is not intended to replace advice given to you by your health care provider. Make sure you discuss any questions you have with your health care provider. Document Released: 07/26/2015 Document Revised: 03/18/2016 Document Reviewed: 04/30/2015 Elsevier Interactive Patient Education  2017 [place] Prevention in the Home Falls can cause injuries. They can happen to people of all ages. There are many things you can do to make your home safe and to help prevent falls. What can I do on the outside of my home? Regularly fix the edges of walkways and driveways and fix any cracks. Remove anything that might make you trip as you walk through a door, such as a raised step or threshold. Trim any bushes or trees on the path to your home. Use bright outdoor lighting. Clear any walking paths of anything that might make someone trip, such as rocks or tools. Regularly check to see if handrails are loose or broken. Make sure that both sides of any steps have handrails. Any raised decks and porches should have guardrails on the edges. Have any leaves, snow, or ice cleared regularly. Use sand or salt on walking paths during winter. Clean up any spills in your garage right away. This includes oil or grease spills. What can I do in the bathroom? Use night lights. Install grab bars by the toilet and in the tub and shower. Do not use towel bars as grab bars. Use non-skid mats or decals in the tub or shower. If you need to sit down in the shower, use a  plastic, non-slip stool. Keep the floor dry. Clean up any water that spills on the floor as soon as it happens. Remove soap buildup in the tub or shower regularly. Attach bath mats securely with double-sided non-slip rug tape. Do not have throw rugs and other things on the floor that can make you trip. What can I do in the bedroom? Use night lights. Make sure that you have a light by your bed that is easy to reach. Do not use any sheets or blankets that are too big for your bed. They should not hang down onto the floor. Have a firm chair that has side arms. You can use this for support while you get dressed. Do not have throw rugs and other things on the floor that can make you trip. What can I do in the kitchen? Clean up any spills right away. Avoid walking on wet floors. Keep items that you use a lot in easy-to-reach places. If you need to reach something above you, use a strong step stool that has a grab bar. Keep electrical cords out of the way. Do not use floor polish or wax that  makes floors slippery. If you must use wax, use non-skid floor wax. Do not have throw rugs and other things on the floor that can make you trip. What can I do with my stairs? Do not leave any items on the stairs. Make sure that there are handrails on both sides of the stairs and use them. Fix handrails that are broken or loose. Make sure that handrails are as long as the stairways. Check any carpeting to make sure that it is firmly attached to the stairs. Fix any carpet that is loose or worn. Avoid having throw rugs at the top or bottom of the stairs. If you do have throw rugs, attach them to the floor with carpet tape. Make sure that you have a light switch at the top of the stairs and the bottom of the stairs. If you do not have them, ask someone to add them for you. What else can I do to help prevent falls? Wear shoes that: Do not have high heels. Have rubber bottoms. Are comfortable and fit you  well. Are closed at the toe. Do not wear sandals. If you use a stepladder: Make sure that it is fully opened. Do not climb a closed stepladder. Make sure that both sides of the stepladder are locked into place. Ask someone to hold it for you, if possible. Clearly mark and make sure that you can see: Any grab bars or handrails. First and last steps. Where the edge of each step is. Use tools that help you move around (mobility aids) if they are needed. These include: Canes. Walkers. Scooters. Crutches. Turn on the lights when you go into a dark area. Replace any light bulbs as soon as they burn out. Set up your furniture so you have a clear path. Avoid moving your furniture around. If any of your floors are uneven, fix them. If there are any pets around you, be aware of where they are. Review your medicines with your doctor. Some medicines can make you feel dizzy. This can increase your chance of falling. Ask your doctor what other things that you can do to help prevent falls. This information is not intended to replace advice given to you by your health care provider. Make sure you discuss any questions you have with your health care provider. Document Released: 04/25/2009 Document Revised: 12/05/2015 Document Reviewed: 08/03/2014 Elsevier Interactive Patient Education  2017 [employer].

## 2021-05-27 DIAGNOSIS — M5386 Other specified dorsopathies, lumbar region: Secondary | ICD-10-CM | POA: Diagnosis not present

## 2021-05-27 DIAGNOSIS — M9903 Segmental and somatic dysfunction of lumbar region: Secondary | ICD-10-CM | POA: Diagnosis not present

## 2021-05-31 DIAGNOSIS — M5386 Other specified dorsopathies, lumbar region: Secondary | ICD-10-CM | POA: Diagnosis not present

## 2021-05-31 DIAGNOSIS — M9903 Segmental and somatic dysfunction of lumbar region: Secondary | ICD-10-CM | POA: Diagnosis not present

## 2021-06-02 ENCOUNTER — Other Ambulatory Visit: Payer: Self-pay

## 2021-06-02 ENCOUNTER — Other Ambulatory Visit (INDEPENDENT_AMBULATORY_CARE_PROVIDER_SITE_OTHER): Payer: PPO

## 2021-06-02 DIAGNOSIS — R739 Hyperglycemia, unspecified: Secondary | ICD-10-CM | POA: Diagnosis not present

## 2021-06-02 DIAGNOSIS — Z125 Encounter for screening for malignant neoplasm of prostate: Secondary | ICD-10-CM

## 2021-06-02 DIAGNOSIS — E7800 Pure hypercholesterolemia, unspecified: Secondary | ICD-10-CM

## 2021-06-02 DIAGNOSIS — R059 Cough, unspecified: Secondary | ICD-10-CM | POA: Diagnosis not present

## 2021-06-02 LAB — LIPID PANEL
Cholesterol: 140 mg/dL (ref 0–200)
HDL: 49.9 mg/dL (ref >39.00)
LDL Cholesterol: 62 mg/dL (ref 0–99)
NonHDL: 90.11
Total CHOL/HDL Ratio: 3
Triglycerides: 143 mg/dL (ref 0.0–149.0)
VLDL: 28.6 mg/dL (ref 0.0–40.0)

## 2021-06-02 LAB — HEPATIC FUNCTION PANEL
ALT: 21 U/L (ref 0–53)
AST: 17 U/L (ref 0–37)
Albumin: 4.4 g/dL (ref 3.5–5.2)
Alkaline Phosphatase: 69 U/L (ref 39–117)
Bilirubin, Direct: 0.2 mg/dL (ref 0.0–0.3)
Total Bilirubin: 1 mg/dL (ref 0.2–1.2)
Total Protein: 6.6 g/dL (ref 6.0–8.3)

## 2021-06-02 LAB — BASIC METABOLIC PANEL
BUN: 15 mg/dL (ref 6–23)
CO2: 31 mEq/L (ref 19–32)
Calcium: 9.4 mg/dL (ref 8.4–10.5)
Chloride: 103 mEq/L (ref 96–112)
Creatinine, Ser: 1.14 mg/dL (ref 0.40–1.50)
GFR: 65.44 mL/min (ref >60.00)
Glucose, Bld: 104 mg/dL — ABNORMAL HIGH (ref 70–99)
Potassium: 4 mEq/L (ref 3.5–5.1)
Sodium: 140 mEq/L (ref 135–145)

## 2021-06-02 LAB — PSA, MEDICARE: PSA: 1.78 ng/ml (ref 0.10–4.00)

## 2021-06-02 LAB — HEMOGLOBIN A1C: Hgb A1c MFr Bld: 6 % (ref 4.6–6.5)

## 2021-06-03 DIAGNOSIS — M5386 Other specified dorsopathies, lumbar region: Secondary | ICD-10-CM | POA: Diagnosis not present

## 2021-06-03 DIAGNOSIS — M9903 Segmental and somatic dysfunction of lumbar region: Secondary | ICD-10-CM | POA: Diagnosis not present

## 2021-06-04 ENCOUNTER — Other Ambulatory Visit: Payer: Self-pay

## 2021-06-04 ENCOUNTER — Ambulatory Visit (INDEPENDENT_AMBULATORY_CARE_PROVIDER_SITE_OTHER): Payer: PPO | Admitting: Pharmacist

## 2021-06-04 ENCOUNTER — Ambulatory Visit (INDEPENDENT_AMBULATORY_CARE_PROVIDER_SITE_OTHER): Payer: PPO | Admitting: Internal Medicine

## 2021-06-04 VITALS — BP 124/72 | HR 70 | Temp 97.2°F | Resp 16 | Ht 68.0 in | Wt 170.6 lb

## 2021-06-04 DIAGNOSIS — E7800 Pure hypercholesterolemia, unspecified: Secondary | ICD-10-CM | POA: Diagnosis not present

## 2021-06-04 DIAGNOSIS — R739 Hyperglycemia, unspecified: Secondary | ICD-10-CM

## 2021-06-04 DIAGNOSIS — F909 Attention-deficit hyperactivity disorder, unspecified type: Secondary | ICD-10-CM

## 2021-06-04 DIAGNOSIS — M25551 Pain in right hip: Secondary | ICD-10-CM

## 2021-06-04 DIAGNOSIS — Z7185 Encounter for immunization safety counseling: Secondary | ICD-10-CM

## 2021-06-04 DIAGNOSIS — Z86010 Personal history of colonic polyps: Secondary | ICD-10-CM | POA: Diagnosis not present

## 2021-06-04 DIAGNOSIS — M25561 Pain in right knee: Secondary | ICD-10-CM | POA: Diagnosis not present

## 2021-06-04 NOTE — Chronic Care Management (AMB) (Signed)
Chronic Care Management CCM Pharmacy Note  06/04/2021 Name:  Elijah Walker MRN:  [id number] DOB:  [birth year]  Summary: - Lipids significantly improved with Repatha.   Recommendations/Changes made from today's visit: - Recommended to continue current regimen at this time - Recommend Tdap and Shingrix series in 2023.   Subjective: Elijah Walker is an 70 y.o. year old male who is a primary patient of Elijah Pheasant, MD.  The CCM team was consulted for assistance with disease management and care coordination needs.    Engaged with patient face to face for follow up visit for pharmacy case management and/or care coordination services.   Objective:  Medications Reviewed Today     Reviewed by Dia Crawford, LPN (Licensed Practical Nurse) on 05/26/21 at [place] List Status: <None>   Medication Order Taking? Sig Documenting Provider Last Dose Status Informant  Evolocumab (REPATHA SURECLICK) 973 MG/ML SOAJ [id number] No Inject 140 mg into the skin every 14 (fourteen) days. Elijah Pheasant, MD Taking Active             Pertinent Labs:   Lab Results  Component Value Date   HGBA1C 6.0 06/02/2021   Lab Results  Component Value Date   CHOL 140 06/02/2021   HDL 49.90 06/02/2021   LDLCALC 62 06/02/2021   TRIG 143.0 06/02/2021   CHOLHDL 3 06/02/2021   Lab Results  Component Value Date   CREATININE 1.14 06/02/2021   BUN 15 06/02/2021   NA 140 06/02/2021   K 4.0 06/02/2021   CL 103 06/02/2021   CO2 31 06/02/2021    SDOH:  (Social Determinants of Health) assessments and interventions performed:    [place]  Review of patient past medical history, allergies, medications, health status, including review of consultants reports, laboratory and other test data, was performed as part of comprehensive evaluation and provision of chronic care management services.   Care Plan : Medication Management  Updates made by De Hollingshead, RPH-CPP since 06/04/2021  12:00 AM  Completed 06/04/2021   Problem: Hyperlipidemia, HTN, pre-diabetes Resolved 06/04/2021     Long-Range Goal: Disease Progression Prevention Completed 06/04/2021  Start Date: 02/20/2021  Recent Progress: On track  Priority: High  Note:   Current Barriers:  Unable to achieve control of cholesterol   Pharmacist Clinical Goal(s):  Over the next 90 days, patient will achieve control of cholesterol as evidenced by LDL  through collaboration with PharmD and provider.   Interventions: 1:1 collaboration with Elijah Pheasant, MD regarding development and update of comprehensive plan of care as evidenced by provider attestation and co-signature Inter-disciplinary care team collaboration (see longitudinal plan of care) Comprehensive medication review performed; medication list updated in electronic medical record  Health Maintenance Yearly influenza vaccination: up to date  Td/Tdap vaccination: due - recommend to pursue in 2023 Pneumonia vaccination: up to date  COVID vaccinations: up to date - received bivalent booster Shingrix vaccinations: due - recommend to pursue in 2023 Colonoscopy: up to date  Hyperlipidemia: Controlled; current treatment: Repatha 140 mg Q14 days Medications previously tried: rosuvastatin 10 mg, atorvastatin 20 mg, pravastatin 20 mg, ezetimibe 10 mg - bilateral muscle pain that started with medication initiation and resolved with medication discontinuation Tolerating well. Denies any side effects.  Praised for improvement in lipid parameters. Recommended to continue current regimen at this time. Discussed affordability support options moving forward.   Hyperglycemia/Prediabetes: A1c <6.5%; diet controlled Continue current lifestyle    Patient Goals/Self-Care Activities Over the next 90 days, patient  will:  - take medications as prescribed       Plan: Goals of care met. Closing CCM case  Elijah Walker, PharmD, [place], [place] Clinical Pharmacist [place] at [employer] [phone]

## 2021-06-04 NOTE — Patient Instructions (Addendum)
Congratulations on your cholesterol! Keep up the great work!  Let me know if you have any issues affording this medication moving forward. There is a group called the [employer] that offers grant funding to patients on expensive cholesterol medications to help cover the copay.   https://www.healthwellfoundation.org/fund/hypercholesterolemia-medicare-access/  We recommend your pursue the following vaccines at your local pharmacy. They should both have a $0 copay in 2023:  1) Shingrix (shingles vaccine)  2) Tdap (tetanus, diptheria, pertussis)  Let me know if you have any medication related needs moving forward!  [name], PharmD  Visit Information  Following are the goals we discussed today:     Plan: Goals of care met. Closing CCM case  [name], PharmD, [place], CPP Clinical Pharmacist [place] at [facility] [phone]  Please call the care guide team at [phone] if you need to cancel or reschedule your appointment.   Patient verbalizes understanding of instructions provided today and agrees to view in [place].

## 2021-06-04 NOTE — Progress Notes (Signed)
Patient ID: Elijah Walker, male   DOB: 1951-05-03, 70 y.o.   MRN: [id number]   Subjective:    Patient ID: Elijah Walker, male    DOB: 04-Aug-1950, 70 y.o.   MRN: [id number]  This visit occurred during the SARS-CoV-2 public health emergency.  Safety protocols were in place, including screening questions prior to the visit, additional usage of staff PPE, and extensive cleaning of exam room while observing appropriate contact time as indicated for disinfecting solutions.   Patient here for a scheduled follow up.    Chief Complaint  Patient presents with   Hyperlipidemia   .   HPI On repatha now.  Recent labs reviewed.  Cholesterol significantly improved.  Tolerating repatha.  Stays active.  No chest pain.  Breathing stable.  No increased sob.  No cough or congestion.  S/p right shoulder surgery 01/16/21.  Has been doing exercise/therapy.  Has noticed some pain - right hip/lateral right knee.  Seeing a Restaurant manager, fast food.  Doing stretches.  Hip is better.  Will notify me if feels needs any further intervention.  Wants to hold for now.  No abdominal pain reported.     Past Medical History:  Diagnosis Date   Blood in stool    H/O   History of chicken pox    Hyperlipidemia    PONV (postoperative nausea and vomiting)    Past Surgical History:  Procedure Laterality Date   CHOLECYSTECTOMY  1998   COLONOSCOPY     SHOULDER ARTHROSCOPY WITH BANKART REPAIR Left 09/14/2017   Procedure: SHOULDER ARTHROSCOPY WITH MINI OPEN ROTATOR CUFF REPAIR, SUBACROMINAL DECOMPRESSION, LABRAL REPAIR,BICEP TENOTOMY, DISTAL CLAVICLE EXCISION ;  Surgeon: Thornton Park, MD;  Location: [facility] ORS;  Service: Orthopedics;  Laterality: Left;   SHOULDER ARTHROSCOPY WITH OPEN ROTATOR CUFF REPAIR AND DISTAL CLAVICLE ACROMINECTOMY Right 01/16/2021   Procedure: RIGHT SHOULDER ARTHROSCOPY WITH MINI-OPEN ROTATOR CUFF REPAIR, SUBACCROMINAL DECOMPRESSION AND DISTAL CLAVICLE EXCISION;  Surgeon: Thornton Park, MD;  Location: [facility] ORS;   Service: Orthopedics;  Laterality: Right;   Family History  Problem Relation Age of Onset   Dementia Mother    Diabetes Father    Colitis Sister    Autoimmune disease Brother    Colitis Daughter    Social History   Socioeconomic History   Marital status: Married    Spouse name: Not on file   Number of children: Not on file   Years of education: Not on file   Highest education level: Not on file  Occupational History   Not on file  Tobacco Use   Smoking status: Never   Smokeless tobacco: Never  Vaping Use   Vaping Use: Never used  Substance and Sexual Activity   Alcohol use: No    Alcohol/week: 0.0 standard drinks    Comment: occ   Drug use: No   Sexual activity: Yes  Other Topics Concern   Not on file  Social History Narrative   Not on file   Social Determinants of Health   Financial Resource Strain: Low Risk    Difficulty of Paying Living Expenses: Not hard at all  Food Insecurity: No Food Insecurity   Worried About Charity fundraiser in the Last Year: Never true   [place] in the Last Year: Never true  Transportation Needs: No Transportation Needs   Lack of Transportation (Medical): No   Lack of Transportation (Non-Medical): No  Physical Activity: Sufficiently Active   Days of Exercise per Week: 4 days   Minutes of Exercise  per Session: 150+ min  Stress: No Stress Concern Present   Feeling of Stress : Not at all  Social Connections: Unknown   Frequency of Communication with Friends and Family: Not on file   Frequency of Social Gatherings with Friends and Family: Not on file   Attends Religious Services: Not on file   Active Member of Clubs or Organizations: Not on file   Attends Archivist Meetings: Not on file   Marital Status: Married     Review of Systems  Constitutional:  Negative for appetite change and unexpected weight change.  HENT:  Negative for congestion and sinus pressure.   Respiratory:  Negative for cough, chest  tightness and shortness of breath.   Cardiovascular:  Negative for chest pain, palpitations and leg swelling.  Gastrointestinal:  Negative for abdominal pain, diarrhea, nausea and vomiting.  Genitourinary:  Negative for difficulty urinating and dysuria.  Musculoskeletal:  Negative for joint swelling and myalgias.       S/p shoulder surgery.  Hip/knee pain as outlined.    Skin:  Negative for color change and rash.  Neurological:  Negative for dizziness, light-headedness and headaches.  Psychiatric/Behavioral:  Negative for agitation and dysphoric mood.       Objective:     BP 124/72   Pulse 70   Temp (!) 97.2 F (36.2 C)   Resp 16   Ht '5\' 8"'  (1.727 m)   Wt 170 lb 9.6 oz (77.4 kg)   SpO2 98%   BMI 25.94 kg/m  Wt Readings from Last 3 Encounters:  06/04/21 170 lb 9.6 oz (77.4 kg)  05/26/21 165 lb (74.8 kg)  01/28/21 165 lb (74.8 kg)    Physical Exam Vitals reviewed.  Constitutional:      General: He is not in acute distress.    Appearance: Normal appearance. He is well-developed.  HENT:     Head: Normocephalic and atraumatic.     Right Ear: External ear normal.     Left Ear: External ear normal.  Eyes:     General: No scleral icterus.       Right eye: No discharge.        Left eye: No discharge.     Conjunctiva/sclera: Conjunctivae normal.  Cardiovascular:     Rate and Rhythm: Normal rate and regular rhythm.  Pulmonary:     Effort: Pulmonary effort is normal. No respiratory distress.     Breath sounds: Normal breath sounds.  Abdominal:     General: Bowel sounds are normal.     Palpations: Abdomen is soft.     Tenderness: There is no abdominal tenderness.  Musculoskeletal:        General: No swelling or tenderness.     Cervical back: Neck supple. No tenderness.  Lymphadenopathy:     Cervical: No cervical adenopathy.  Skin:    Findings: No erythema or rash.  Neurological:     Mental Status: He is alert.  Psychiatric:        Mood and Affect: Mood normal.         Behavior: Behavior normal.     Outpatient Encounter Medications as of 06/04/2021  Medication Sig   Evolocumab (REPATHA SURECLICK) 811 MG/ML SOAJ Inject 140 mg into the skin every 14 (fourteen) days.   No facility-administered encounter medications on file as of 06/04/2021.     Lab Results  Component Value Date   WBC 6.1 12/30/2020   HGB 16.1 12/30/2020   HCT 48.2 12/30/2020   PLT  234.0 12/30/2020   GLUCOSE 104 (H) 06/02/2021   CHOL 140 06/02/2021   TRIG 143.0 06/02/2021   HDL 49.90 06/02/2021   LDLCALC 62 06/02/2021   ALT 21 06/02/2021   AST 17 06/02/2021   NA 140 06/02/2021   K 4.0 06/02/2021   CL 103 06/02/2021   CREATININE 1.14 06/02/2021   BUN 15 06/02/2021   CO2 31 06/02/2021   TSH 2.71 08/26/2020   PSA 1.78 06/02/2021   INR 1.0 08/30/2017   HGBA1C 6.0 06/02/2021   MICROALBUR <0.7 07/20/2016    [place] OR NERVE BLOCK-IMAGE ONLY [facility])  Result Date: 01/16/2021 There is no interpretation for this exam.  This order is for images obtained during a surgical procedure.  Please See "Surgeries" Tab for more information regarding the procedure.       Assessment & Plan:   Problem List Items Addressed This Visit     ADHD    Off adderall.  Desires to remain off medication.  Follow.       History of colonic polyps    Colonoscopy 10/2013.  Recommended f/u in 10 years.        Hypercholesterolemia - Primary    On repatha.  Monitors diet.  Cholesterol much improved.  Follow lipid panel.   Lab Results  Component Value Date   CHOL 140 06/02/2021   HDL 49.90 06/02/2021   LDLCALC 62 06/02/2021   TRIG 143.0 06/02/2021   CHOLHDL 3 06/02/2021       Relevant Orders   Hepatic function panel   Lipid panel   Hyperglycemia    Low carb diet and exercise.  Follow met b and a1c.       Relevant Orders   Basic metabolic panel   Hemoglobin A1c   TSH   Immunization counseling    Discussed shingles vaccine.       Knee pain    Right lateral knee pain as outlined. Seeing  chiropractor.  Wants to monitor.        Right hip pain    Seeing a chiropractor.  Has improved.  Follow.  Will notify if persistent.         [name], MD

## 2021-06-06 ENCOUNTER — Encounter: Payer: Self-pay | Admitting: Internal Medicine

## 2021-06-06 DIAGNOSIS — Z7185 Encounter for immunization safety counseling: Secondary | ICD-10-CM | POA: Insufficient documentation

## 2021-06-06 DIAGNOSIS — M25551 Pain in right hip: Secondary | ICD-10-CM | POA: Insufficient documentation

## 2021-06-06 NOTE — Assessment & Plan Note (Signed)
Seeing a Restaurant manager, fast food.  Has improved.  Follow.  Will notify if persistent.

## 2021-06-06 NOTE — Assessment & Plan Note (Signed)
Discussed shingles vaccine.

## 2021-06-06 NOTE — Assessment & Plan Note (Signed)
Low carb diet and exercise.  Follow met b and a1c.  

## 2021-06-06 NOTE — Assessment & Plan Note (Signed)
On repatha.  Monitors diet.  Cholesterol much improved.  Follow lipid panel.   Lab Results  Component Value Date   CHOL 140 06/02/2021   HDL 49.90 06/02/2021   LDLCALC 62 06/02/2021   TRIG 143.0 06/02/2021   CHOLHDL 3 06/02/2021

## 2021-06-06 NOTE — Assessment & Plan Note (Signed)
Off adderall.  Desires to remain off medication.  Follow.

## 2021-06-06 NOTE — Assessment & Plan Note (Signed)
Right lateral knee pain as outlined. Seeing chiropractor.  Wants to monitor.

## 2021-06-06 NOTE — Assessment & Plan Note (Signed)
Colonoscopy 10/2013.  Recommended f/u in 10 years.   

## 2021-06-10 DIAGNOSIS — M9903 Segmental and somatic dysfunction of lumbar region: Secondary | ICD-10-CM | POA: Diagnosis not present

## 2021-06-10 DIAGNOSIS — M5386 Other specified dorsopathies, lumbar region: Secondary | ICD-10-CM | POA: Diagnosis not present

## 2021-06-11 DIAGNOSIS — E7800 Pure hypercholesterolemia, unspecified: Secondary | ICD-10-CM

## 2021-06-12 DIAGNOSIS — M9903 Segmental and somatic dysfunction of lumbar region: Secondary | ICD-10-CM | POA: Diagnosis not present

## 2021-06-12 DIAGNOSIS — M5386 Other specified dorsopathies, lumbar region: Secondary | ICD-10-CM | POA: Diagnosis not present

## 2021-06-13 DIAGNOSIS — M25511 Pain in right shoulder: Secondary | ICD-10-CM | POA: Diagnosis not present

## 2021-06-17 DIAGNOSIS — M5386 Other specified dorsopathies, lumbar region: Secondary | ICD-10-CM | POA: Diagnosis not present

## 2021-06-17 DIAGNOSIS — M9903 Segmental and somatic dysfunction of lumbar region: Secondary | ICD-10-CM | POA: Diagnosis not present

## 2021-06-20 DIAGNOSIS — M9903 Segmental and somatic dysfunction of lumbar region: Secondary | ICD-10-CM | POA: Diagnosis not present

## 2021-06-20 DIAGNOSIS — M5386 Other specified dorsopathies, lumbar region: Secondary | ICD-10-CM | POA: Diagnosis not present

## 2021-06-26 DIAGNOSIS — M9903 Segmental and somatic dysfunction of lumbar region: Secondary | ICD-10-CM | POA: Diagnosis not present

## 2021-06-26 DIAGNOSIS — M5386 Other specified dorsopathies, lumbar region: Secondary | ICD-10-CM | POA: Diagnosis not present

## 2021-07-05 ENCOUNTER — Encounter: Payer: Self-pay | Admitting: Internal Medicine

## 2021-07-08 NOTE — Telephone Encounter (Signed)
FYI-  Spoke with patient and wife. Symptoms started on 12/18. 2 neg covid tests 12/19 and 12/20. Tested positive on 12/23. Fever, sore throat, cough, runny nose, sneezing. These symptoms have pretty much resolved. No sob. Chest pain, chest tightness, etc. Offered patient VV and also educated about my chart urgent care virtual visit in case  he decides to do appt. Patient stated that he felt fine now and would let me know if they need anything. Advised to continue masking, cleaning surfaces and limiting contact in household. Aware of quarantine guidelines.

## 2021-07-08 NOTE — Telephone Encounter (Signed)
Noted. Per review, doing better.  Will call if problems.

## 2021-07-08 NOTE — Telephone Encounter (Signed)
LMTCB

## 2021-10-23 ENCOUNTER — Other Ambulatory Visit: Payer: Self-pay | Admitting: Internal Medicine

## 2021-10-23 DIAGNOSIS — E7800 Pure hypercholesterolemia, unspecified: Secondary | ICD-10-CM

## 2021-10-26 ENCOUNTER — Encounter: Payer: Self-pay | Admitting: Internal Medicine

## 2021-10-27 NOTE — Telephone Encounter (Signed)
LMTCB to see when I could get patient scheduled & see if he could do a 7am appointment.  ?

## 2021-10-27 NOTE — Telephone Encounter (Signed)
Pt returning call... Scheduled Pt on 11/10/2021 at 7am... Pt will come to appt fasting and ready for lab work...  ?

## 2021-11-10 ENCOUNTER — Ambulatory Visit: Payer: PPO | Admitting: Internal Medicine

## 2021-11-20 ENCOUNTER — Telehealth: Payer: Self-pay | Admitting: Internal Medicine

## 2021-11-20 DIAGNOSIS — D649 Anemia, unspecified: Secondary | ICD-10-CM

## 2021-11-20 DIAGNOSIS — E7800 Pure hypercholesterolemia, unspecified: Secondary | ICD-10-CM

## 2021-11-20 DIAGNOSIS — R739 Hyperglycemia, unspecified: Secondary | ICD-10-CM

## 2021-11-20 NOTE — Telephone Encounter (Signed)
Patient has a lab appt 11/21/2021, there are no orders in. ?

## 2021-11-21 ENCOUNTER — Other Ambulatory Visit (INDEPENDENT_AMBULATORY_CARE_PROVIDER_SITE_OTHER): Payer: PPO

## 2021-11-21 DIAGNOSIS — D649 Anemia, unspecified: Secondary | ICD-10-CM

## 2021-11-21 DIAGNOSIS — R739 Hyperglycemia, unspecified: Secondary | ICD-10-CM | POA: Diagnosis not present

## 2021-11-21 DIAGNOSIS — E7800 Pure hypercholesterolemia, unspecified: Secondary | ICD-10-CM

## 2021-11-21 LAB — HEPATIC FUNCTION PANEL
ALT: 25 U/L (ref 0–53)
AST: 19 U/L (ref 0–37)
Albumin: 4.2 g/dL (ref 3.5–5.2)
Alkaline Phosphatase: 78 U/L (ref 39–117)
Bilirubin, Direct: 0.2 mg/dL (ref 0.0–0.3)
Total Bilirubin: 0.7 mg/dL (ref 0.2–1.2)
Total Protein: 6.4 g/dL (ref 6.0–8.3)

## 2021-11-21 LAB — TSH: TSH: 2.94 u[IU]/mL (ref 0.35–5.50)

## 2021-11-21 LAB — BASIC METABOLIC PANEL
BUN: 14 mg/dL (ref 6–23)
CO2: 32 mEq/L (ref 19–32)
Calcium: 9.6 mg/dL (ref 8.4–10.5)
Chloride: 104 mEq/L (ref 96–112)
Creatinine, Ser: 1.26 mg/dL (ref 0.40–1.50)
GFR: 57.84 mL/min — ABNORMAL LOW (ref >60.00)
Glucose, Bld: 112 mg/dL — ABNORMAL HIGH (ref 70–99)
Potassium: 4.6 mEq/L (ref 3.5–5.1)
Sodium: 140 mEq/L (ref 135–145)

## 2021-11-21 LAB — HEMOGLOBIN A1C: Hgb A1c MFr Bld: 6 % (ref 4.6–6.5)

## 2021-11-21 LAB — CBC WITH DIFFERENTIAL/PLATELET
Basophils Absolute: 0 10*3/uL (ref 0.0–0.1)
Basophils Relative: 0.7 % (ref 0.0–3.0)
Eosinophils Absolute: 0.1 10*3/uL (ref 0.0–0.7)
Eosinophils Relative: 2.7 % (ref 0.0–5.0)
HCT: 46.3 % (ref 39.0–52.0)
Hemoglobin: 15.3 g/dL (ref 13.0–17.0)
Lymphocytes Relative: 35.1 % (ref 12.0–46.0)
Lymphs Abs: 1.6 10*3/uL (ref 0.7–4.0)
MCHC: 33 g/dL (ref 30.0–36.0)
MCV: 95.5 fl (ref 78.0–100.0)
Monocytes Absolute: 0.5 10*3/uL (ref 0.1–1.0)
Monocytes Relative: 9.8 % (ref 3.0–12.0)
Neutro Abs: 2.4 10*3/uL (ref 1.4–7.7)
Neutrophils Relative %: 51.7 % (ref 43.0–77.0)
Platelets: 189 10*3/uL (ref 150.0–400.0)
RBC: 4.85 Mil/uL (ref 4.22–5.81)
RDW: 13.7 % (ref 11.5–15.5)
WBC: 4.7 10*3/uL (ref 4.0–10.5)

## 2021-11-21 LAB — LIPID PANEL
Cholesterol: 141 mg/dL (ref 0–200)
HDL: 52.3 mg/dL (ref >39.00)
LDL Cholesterol: 64 mg/dL (ref 0–99)
NonHDL: 88.24
Total CHOL/HDL Ratio: 3
Triglycerides: 121 mg/dL (ref 0.0–149.0)
VLDL: 24.2 mg/dL (ref 0.0–40.0)

## 2021-11-21 NOTE — Telephone Encounter (Signed)
Orders placed for labs

## 2021-11-21 NOTE — Addendum Note (Signed)
Addended by: Alisa Graff on: 11/21/2021 04:25 AM ? ? Modules accepted: Orders ? ?

## 2021-11-25 ENCOUNTER — Ambulatory Visit (INDEPENDENT_AMBULATORY_CARE_PROVIDER_SITE_OTHER): Payer: PPO | Admitting: Internal Medicine

## 2021-11-25 DIAGNOSIS — E7800 Pure hypercholesterolemia, unspecified: Secondary | ICD-10-CM

## 2021-11-25 DIAGNOSIS — R739 Hyperglycemia, unspecified: Secondary | ICD-10-CM

## 2021-11-26 ENCOUNTER — Encounter: Payer: Self-pay | Admitting: Internal Medicine

## 2021-11-26 NOTE — Progress Notes (Signed)
Was not seen.  Appt was rescheduled.  ?

## 2021-12-02 ENCOUNTER — Ambulatory Visit (INDEPENDENT_AMBULATORY_CARE_PROVIDER_SITE_OTHER): Payer: PPO | Admitting: Internal Medicine

## 2021-12-02 ENCOUNTER — Encounter: Payer: Self-pay | Admitting: Internal Medicine

## 2021-12-02 VITALS — BP 140/68 | HR 64 | Temp 98.3°F | Resp 13 | Ht 68.0 in | Wt 167.6 lb

## 2021-12-02 DIAGNOSIS — E7800 Pure hypercholesterolemia, unspecified: Secondary | ICD-10-CM

## 2021-12-02 DIAGNOSIS — D649 Anemia, unspecified: Secondary | ICD-10-CM | POA: Diagnosis not present

## 2021-12-02 DIAGNOSIS — Z86010 Personal history of colonic polyps: Secondary | ICD-10-CM | POA: Diagnosis not present

## 2021-12-02 DIAGNOSIS — R739 Hyperglycemia, unspecified: Secondary | ICD-10-CM

## 2021-12-02 DIAGNOSIS — R944 Abnormal results of kidney function studies: Secondary | ICD-10-CM | POA: Diagnosis not present

## 2021-12-02 DIAGNOSIS — Z125 Encounter for screening for malignant neoplasm of prostate: Secondary | ICD-10-CM | POA: Diagnosis not present

## 2021-12-02 LAB — BASIC METABOLIC PANEL
BUN: 15 mg/dL (ref 6–23)
CO2: 32 mEq/L (ref 19–32)
Calcium: 9.6 mg/dL (ref 8.4–10.5)
Chloride: 102 mEq/L (ref 96–112)
Creatinine, Ser: 1.21 mg/dL (ref 0.40–1.50)
GFR: 60.71 mL/min (ref >60.00)
Glucose, Bld: 61 mg/dL — ABNORMAL LOW (ref 70–99)
Potassium: 4 mEq/L (ref 3.5–5.1)
Sodium: 140 mEq/L (ref 135–145)

## 2021-12-02 NOTE — Progress Notes (Signed)
Patient ID: Elijah Walker, male   DOB: 1950/11/30, 71 y.o.   MRN: [id number]   Subjective:    Patient ID: Elijah Walker, male    DOB: 1950/08/21, 72 y.o.   MRN: [id number]   Patient here for a scheduled follow up.   Chief Complaint  Patient presents with   Follow-up    80mofollow up    Hyperlipidemia   ADHD   .   HPI Doing well.  Staying active. Exercising regularly.  Lifts weights 2 days per week and aerobic activity 3 days per week.  No chest pain or sob reported.  No abdominal pain or bowel change reported.  Shoulders, knees (msk issues) better since exercising.  Overall feels good.  Discussed labs.    Past Medical History:  Diagnosis Date   Blood in stool    H/O   History of chicken pox    Hyperlipidemia    PONV (postoperative nausea and vomiting)    Past Surgical History:  Procedure Laterality Date   CHOLECYSTECTOMY  1998   COLONOSCOPY     SHOULDER ARTHROSCOPY WITH BANKART REPAIR Left 09/14/2017   Procedure: SHOULDER ARTHROSCOPY WITH MINI OPEN ROTATOR CUFF REPAIR, SUBACROMINAL DECOMPRESSION, LABRAL REPAIR,BICEP TENOTOMY, DISTAL CLAVICLE EXCISION ;  Surgeon: KThornton Park MD;  Location: [facility];  Service: Orthopedics;  Laterality: Left;   SHOULDER ARTHROSCOPY WITH OPEN ROTATOR CUFF REPAIR AND DISTAL CLAVICLE ACROMINECTOMY Right 01/16/2021   Procedure: RIGHT SHOULDER ARTHROSCOPY WITH MINI-OPEN ROTATOR CUFF REPAIR, SUBACCROMINAL DECOMPRESSION AND DISTAL CLAVICLE EXCISION;  Surgeon: KThornton Park MD;  Location: [facility];  Service: Orthopedics;  Laterality: Right;   Family History  Problem Relation Age of Onset   Dementia Mother    Diabetes Father    Colitis Sister    Autoimmune disease Brother    Colitis Daughter    Social History   Socioeconomic History   Marital status: Married    Spouse name: Not on file   Number of children: Not on file   Years of education: Not on file   Highest education level: Not on file  Occupational History   Not on file  Tobacco  Use   Smoking status: Never   Smokeless tobacco: Never  Vaping Use   Vaping Use: Never used  Substance and Sexual Activity   Alcohol use: No    Alcohol/week: 0.0 standard drinks    Comment: occ   Drug use: No   Sexual activity: Yes  Other Topics Concern   Not on file  Social History Narrative   Not on file   Social Determinants of Health   Financial Resource Strain: Low Risk    Difficulty of Paying Living Expenses: Not hard at all  Food Insecurity: No Food Insecurity   Worried About RCharity fundraiserin the Last Year: Never true   RSalemin the Last Year: Never true  Transportation Needs: No Transportation Needs   Lack of Transportation (Medical): No   Lack of Transportation (Non-Medical): No  Physical Activity: Sufficiently Active   Days of Exercise per Week: 4 days   Minutes of Exercise per Session: 150+ min  Stress: No Stress Concern Present   Feeling of Stress : Not at all  Social Connections: Unknown   Frequency of Communication with Friends and Family: Not on file   Frequency of Social Gatherings with Friends and Family: Not on file   Attends Religious Services: Not on file   Active Member of Clubs or Organizations: Not on file  Attends Archivist Meetings: Not on file   Marital Status: Married     Review of Systems  Constitutional:  Negative for appetite change and unexpected weight change.  HENT:  Negative for congestion and sinus pressure.   Respiratory:  Negative for cough, chest tightness and shortness of breath.   Cardiovascular:  Negative for chest pain, palpitations and leg swelling.  Gastrointestinal:  Negative for abdominal pain, diarrhea, nausea and vomiting.  Genitourinary:  Negative for difficulty urinating and dysuria.  Musculoskeletal:  Negative for joint swelling and myalgias.  Skin:  Negative for color change and rash.  Neurological:  Negative for dizziness, light-headedness and headaches.  Psychiatric/Behavioral:   Negative for agitation and dysphoric mood.       Objective:     BP 140/68 (BP Location: Left Arm, Patient Position: Sitting, Cuff Size: Small)   Pulse 64   Temp 98.3 F (36.8 C) (Temporal)   Resp 13   Ht '5\' 8"'  (1.727 m)   Wt 167 lb 9.6 oz (76 kg)   SpO2 98%   BMI 25.48 kg/m  Wt Readings from Last 3 Encounters:  12/02/21 167 lb 9.6 oz (76 kg)  06/04/21 170 lb 9.6 oz (77.4 kg)  05/26/21 165 lb (74.8 kg)    Physical Exam Constitutional:      General: He is not in acute distress.    Appearance: Normal appearance. He is well-developed.  HENT:     Head: Normocephalic and atraumatic.     Right Ear: External ear normal.     Left Ear: External ear normal.  Eyes:     General: No scleral icterus.       Right eye: No discharge.        Left eye: No discharge.  Cardiovascular:     Rate and Rhythm: Normal rate and regular rhythm.  Pulmonary:     Effort: Pulmonary effort is normal. No respiratory distress.     Breath sounds: Normal breath sounds.  Abdominal:     General: Bowel sounds are normal.     Palpations: Abdomen is soft.     Tenderness: There is no abdominal tenderness.  Musculoskeletal:        General: No swelling or tenderness.     Cervical back: Neck supple. No tenderness.  Lymphadenopathy:     Cervical: No cervical adenopathy.  Skin:    Findings: No erythema or rash.  Neurological:     Mental Status: He is alert.  Psychiatric:        Mood and Affect: Mood normal.        Behavior: Behavior normal.     Outpatient Encounter Medications as of 12/02/2021  Medication Sig   REPATHA SURECLICK 629 MG/ML SOAJ INJECT 140 MG INTO THE SKIN EVERY 14 (FOURTEEN) DAYS.   No facility-administered encounter medications on file as of 12/02/2021.     Lab Results  Component Value Date   WBC 4.7 11/21/2021   HGB 15.3 11/21/2021   HCT 46.3 11/21/2021   PLT 189.0 11/21/2021   GLUCOSE 61 (L) 12/02/2021   CHOL 141 11/21/2021   TRIG 121.0 11/21/2021   HDL 52.30 11/21/2021    LDLCALC 64 11/21/2021   ALT 25 11/21/2021   AST 19 11/21/2021   NA 140 12/02/2021   K 4.0 12/02/2021   CL 102 12/02/2021   CREATININE 1.21 12/02/2021   BUN 15 12/02/2021   CO2 32 12/02/2021   TSH 2.94 11/21/2021   PSA 1.78 06/02/2021   INR 1.0 08/30/2017  HGBA1C 6.0 11/21/2021   MICROALBUR <0.7 07/20/2016    [place] OR NERVE BLOCK-IMAGE ONLY [facility])  Result Date: 01/16/2021 There is no interpretation for this exam.  This order is for images obtained during a surgical procedure.  Please See "Surgeries" Tab for more information regarding the procedure.       Assessment & Plan:   Problem List Items Addressed This Visit     Anemia    Follow cbc.        Decreased GFR - Primary    Continue to stay hydrated.  Avoid anti-inflammatories.  Recheck metabolic panel.       Relevant Orders   Basic metabolic panel (Completed)   History of colonic polyps    Colonoscopy 10/2013.  Recommended f/u in 10 years.         Hypercholesterolemia    On repatha.  Monitors diet.  Cholesterol much improved.  Follow lipid panel.   Lab Results  Component Value Date   CHOL 141 11/21/2021   HDL 52.30 11/21/2021   LDLCALC 64 11/21/2021   TRIG 121.0 11/21/2021   CHOLHDL 3 11/21/2021       Relevant Orders   Basic metabolic panel   Hepatic function panel   Lipid panel   Hyperglycemia    Low carb diet and exercise.  Follow met b and a1c.        Relevant Orders   Hemoglobin A1c   Other Visit Diagnoses     Prostate cancer screening       Relevant Orders   PSA, Medicare        [name], MD

## 2021-12-07 ENCOUNTER — Encounter: Payer: Self-pay | Admitting: Internal Medicine

## 2021-12-07 NOTE — Assessment & Plan Note (Signed)
Continue to stay hydrated.  Avoid anti-inflammatories.  Recheck metabolic panel.

## 2021-12-07 NOTE — Assessment & Plan Note (Signed)
Follow cbc.  

## 2021-12-07 NOTE — Assessment & Plan Note (Signed)
On repatha.  Monitors diet.  Cholesterol much improved.  Follow lipid panel.   Lab Results  Component Value Date   CHOL 141 11/21/2021   HDL 52.30 11/21/2021   LDLCALC 64 11/21/2021   TRIG 121.0 11/21/2021   CHOLHDL 3 11/21/2021   

## 2021-12-07 NOTE — Assessment & Plan Note (Signed)
Colonoscopy 10/2013.  Recommended f/u in 10 years.   

## 2021-12-07 NOTE — Assessment & Plan Note (Signed)
Low carb diet and exercise.  Follow met b and a1c.  

## 2021-12-22 ENCOUNTER — Encounter: Payer: Self-pay | Admitting: Internal Medicine

## 2021-12-22 NOTE — Telephone Encounter (Signed)
Agree with evaluation to confirm etiology of pain/symptoms.  If any acute change or problems prior to scheduled appt - needs to be evaluated.

## 2021-12-22 NOTE — Telephone Encounter (Signed)
I called and triaged patient. He stated that he had pulled a muscle about a week ago felt like it was pectoral muscle on left side. It only hurts when he is moving, pulling or sometimes when he takes a deep breath. He does feel cardiac related as he added in message that BP has been normal & he feels good. He has no CP, SOB, dizziness, vision changes, left arm pain/numbness/weakness. He said that he has rested the area but no other treatment. He was not sure how long he should give it to heal. Pt wanted advice but willing to be seen just in case. He is scheduled SAME DAY tomorrow with Joycelyn Schmid.

## 2021-12-23 ENCOUNTER — Encounter: Payer: Self-pay | Admitting: Family

## 2021-12-23 ENCOUNTER — Other Ambulatory Visit
Admission: RE | Admit: 2021-12-23 | Discharge: 2021-12-23 | Disposition: A | Discharge status: Home / Self Care | Payer: PPO | Source: Home / Self Care | Attending: Family | Admitting: Family

## 2021-12-23 ENCOUNTER — Ambulatory Visit
Admission: RE | Admit: 2021-12-23 | Discharge: 2021-12-23 | Disposition: A | Discharge status: Home / Self Care | Payer: PPO | Source: Ambulatory Visit | Attending: Family | Admitting: Family

## 2021-12-23 ENCOUNTER — Ambulatory Visit (INDEPENDENT_AMBULATORY_CARE_PROVIDER_SITE_OTHER): Payer: PPO | Admitting: Family

## 2021-12-23 ENCOUNTER — Ambulatory Visit
Admission: RE | Admit: 2021-12-23 | Discharge: 2021-12-23 | Disposition: A | Discharge status: Home / Self Care | Payer: PPO | Attending: Family | Admitting: Family

## 2021-12-23 VITALS — BP 124/78 | HR 67 | Temp 97.6°F | Ht 68.0 in | Wt 167.2 lb

## 2021-12-23 DIAGNOSIS — R0789 Other chest pain: Secondary | ICD-10-CM | POA: Insufficient documentation

## 2021-12-23 DIAGNOSIS — R079 Chest pain, unspecified: Secondary | ICD-10-CM | POA: Diagnosis not present

## 2021-12-23 LAB — BRAIN NATRIURETIC PEPTIDE: B Natriuretic Peptide: 11.4 pg/mL (ref 0.0–100.0)

## 2021-12-23 LAB — TROPONIN I (HIGH SENSITIVITY): Troponin I (High Sensitivity): 4 ng/L (ref <18)

## 2021-12-23 NOTE — Patient Instructions (Signed)
I placed a referral to cardiology for further restratification including discussion of stress test, CT calcium score.  As discussed today, presentation most likely supports musculoskeletal however I want you to stay incredibly vigilant as it relates to chest pain, shortness of breath.  If this is were to change or worsen in any way, please have a low threshold for calling 911 or presenting to the nearest emergency room.  Nonspecific Chest Pain, Adult Chest pain is an uncomfortable, tight, or painful feeling in the chest. The pain can feel like a crushing, aching, or squeezing pressure. A person can feel a burning or tingling sensation. Chest pain can also be felt in your back, neck, jaw, shoulder, or arm. This pain can be worse when you move, sneeze, or take a deep breath. Chest pain can be caused by a condition that is life-threatening. This must be treated right away. It can also be caused by something that is not life-threatening. If you have chest pain, it can be hard to know the difference, so it is important to get help right away to make sure that you do not have a serious condition. Some life-threatening causes of chest pain include: Heart attack. A tear in the body's main blood vessel (aortic dissection). Inflammation around your heart (pericarditis). A problem in the lungs, such as a blood clot (pulmonary embolism) or a collapsed lung (pneumothorax). Some non life-threatening causes of chest pain include: Heartburn. Anxiety or stress. Damage to the bones, muscles, and cartilage that make up your chest wall. Pneumonia or bronchitis. Shingles infection (varicella-zoster virus). Your chest pain may come and go. It may also be constant. Your health care provider will do tests and other studies to find the cause of your pain. Treatment will depend on the cause of your chest pain. Follow these instructions at home: Medicines Take over-the-counter and prescription medicines only as told by your  health care provider. If you were prescribed an antibiotic medicine, take it as told by your health care provider. Do not stop taking the antibiotic even if you start to feel better. Activity Avoid any activities that cause chest pain. Do not lift anything that is heavier than 10 lb (4.5 kg), or the limit that you are told, until your health care provider says that it is safe. Rest as directed by your health care provider. Return to your normal activities only as told by your health care provider. Ask your health care provider what activities are safe for you. Lifestyle     Do not use any products that contain nicotine or tobacco, such as cigarettes, e-cigarettes, and chewing tobacco. If you need help quitting, ask your health care provider. Do not drink alcohol. Make healthy lifestyle changes as recommended. These may include: Getting regular exercise. Ask your health care provider to suggest some exercises that are safe for you. Eating a heart-healthy diet. This includes plenty of fresh fruits and vegetables, whole grains, low-fat (lean) protein, and low-fat dairy products. A dietitian can help you find healthy eating options. Maintaining a healthy weight. Managing any other health conditions you may have, such as high blood pressure (hypertension) or diabetes. Reducing stress, such as with yoga or relaxation techniques. General instructions Pay attention to any changes in your symptoms. It is up to you to get the results of any tests that were done. Ask your health care provider, or the department that is doing the tests, when your results will be ready. Keep all follow-up visits as told by your health  care provider. This is important. You may be asked to go for further testing if your chest pain does not go away. Contact a health care provider if: Your chest pain does not go away. You feel depressed. You have a fever. You notice changes in your symptoms or develop new symptoms. Get  help right away if: Your chest pain gets worse. You have a cough that gets worse, or you cough up blood. You have severe pain in your abdomen. You faint. You have sudden, unexplained chest discomfort. You have sudden, unexplained discomfort in your arms, back, neck, or jaw. You have shortness of breath at any time. You suddenly start to sweat, or your skin gets clammy. You feel nausea or you vomit. You suddenly feel lightheaded or dizzy. You have severe weakness, or unexplained weakness or fatigue. Your heart begins to beat quickly, or it feels like it is skipping beats. These symptoms may represent a serious problem that is an emergency. Do not wait to see if the symptoms will go away. Get medical help right away. Call your local emergency services (911 in the [place] Do not drive yourself to the hospital. Summary Chest pain can be caused by a condition that is serious and requires urgent treatment. It may also be caused by something that is not life-threatening. Your health care provider may do lab tests and other studies to find the cause of your pain. Follow your health care provider's instructions on taking medicines, making lifestyle changes, and getting emergency treatment if symptoms become worse. Keep all follow-up visits as told by your health care provider. This includes visits for any further testing if your chest pain does not go away. This information is not intended to replace advice given to you by your health care provider. Make sure you discuss any questions you have with your health care provider. Document Revised: 09/12/2020 Document Reviewed: 09/12/2020 Elsevier Patient Education  [place].

## 2021-12-23 NOTE — Progress Notes (Signed)
Pulled muscle left pectoral muscle. Works out on regular basis Over the heart only reason he came in

## 2021-12-23 NOTE — Progress Notes (Signed)
Subjective:    Patient ID: Elijah Walker, male    DOB: 06/14/1951, 71 y.o.   MRN: [id number]  CC: Fawzi Melman is a 71 y.o. male who presents today for an acute visit.    HPI: Complains of 'pulling' pain across chest x 10 days, comes and goes. No pull symptom when sitting in chair. If pushes up on chairs with arms or if leans forward and pulls arm back on chair, pulling will return.   Pain is associated to upper back pain between shoulder and in bilateral upper arm.   Started out a  'point pain' on left side of chest. Pain is now  more located left pectoral closer to axilla.  [place] and had no CP at that time.   Previously limited by exercise due to bilateral shoulder pain, 9 months ago started more regular exercise program.   He runs every other day or 2.5 times per week. He weight lifts with chest pull in front and to his back for 30 minutes 2.5 per week. When he runs on treadmill,  he lifts himself up on the treadmill for 20 minutes to reduce pain in lower joints.   He reduced work out over the past 10 days and has reduced weight lifting , not stopped, over the past few days and symptom was unchanged.   No associated exertional chest pain or pressure, numbness or tingling radiating to left arm or jaw, palpitations, dizziness, frequent headaches, changes in vision, pain with eating, abdominal pain, or shortness of breath.   He has tried tylenol '500mg'$  with some relief.   H/o cholecystectomy    H/o HLD- compliant with repatha  No prior cardiac history No family h/o cardiac disease, CAD  HISTORY:  Past Medical History:  Diagnosis Date   Blood in stool    H/O   History of chicken pox    Hyperlipidemia    PONV (postoperative nausea and vomiting)    Past Surgical History:  Procedure Laterality Date   CHOLECYSTECTOMY  1998   COLONOSCOPY     SHOULDER ARTHROSCOPY WITH BANKART REPAIR Left 09/14/2017   Procedure: SHOULDER ARTHROSCOPY WITH MINI  OPEN ROTATOR CUFF REPAIR, SUBACROMINAL DECOMPRESSION, LABRAL REPAIR,BICEP TENOTOMY, DISTAL CLAVICLE EXCISION ;  Surgeon: Thornton Park, MD;  Location: [facility];  Service: Orthopedics;  Laterality: Left;   SHOULDER ARTHROSCOPY WITH OPEN ROTATOR CUFF REPAIR AND DISTAL CLAVICLE ACROMINECTOMY Right 01/16/2021   Procedure: RIGHT SHOULDER ARTHROSCOPY WITH MINI-OPEN ROTATOR CUFF REPAIR, SUBACCROMINAL DECOMPRESSION AND DISTAL CLAVICLE EXCISION;  Surgeon: Thornton Park, MD;  Location: [facility];  Service: Orthopedics;  Laterality: Right;   Family History  Problem Relation Age of Onset   Dementia Mother    Diabetes Father    Colitis Sister    Autoimmune disease Brother    Colitis Daughter     Allergies: Patient has no known allergies. Current Outpatient Medications on File Prior to Visit  Medication Sig Dispense Refill   REPATHA SURECLICK 427 MG/ML SOAJ INJECT 140 MG INTO THE SKIN EVERY 14 (FOURTEEN) DAYS. 6 mL 1   No current facility-administered medications on file prior to visit.    Social History   Tobacco Use   Smoking status: Never   Smokeless tobacco: Never  Vaping Use   Vaping Use: Never used  Substance Use Topics   Alcohol use: No    Alcohol/week: 0.0 standard drinks of alcohol    Comment: occ   Drug use: No  Review of Systems  Constitutional:  Negative for chills and fever.  Respiratory:  Negative for cough, chest tightness, shortness of breath and wheezing.   Cardiovascular:  Positive for chest pain. Negative for palpitations and leg swelling.  Gastrointestinal:  Negative for nausea and vomiting.      Objective:    BP 124/78 (BP Location: Left Arm, Patient Position: Sitting, Cuff Size: Normal)   Pulse 67   Temp 97.6 F (36.4 C) (Oral)   Ht '5\' 8"'$  (1.727 m)   Wt 167 lb 3.2 oz (75.8 kg)   SpO2 96%   BMI 25.42 kg/m    Physical Exam Vitals reviewed.  Constitutional:      Appearance: He is well-developed.  Cardiovascular:     Rate and Rhythm: Regular  rhythm.     Heart sounds: Normal heart sounds.  Pulmonary:     Effort: Pulmonary effort is normal. No respiratory distress.     Breath sounds: Normal breath sounds. No wheezing, rhonchi or rales.  Chest:       Comments: Reproducible chest wall pain with deep palpation , when asked patient to lean forward.   Musculoskeletal:     Right lower leg: No edema.     Left lower leg: No edema.  Skin:    General: Skin is warm and dry.  Neurological:     Mental Status: He is alert.  Psychiatric:        Speech: Speech normal.        Behavior: Behavior normal.        Assessment & Plan:   Problem List Items Addressed This Visit       Other   Chest tightness - Primary    Presentation and recent history as relates to exercise most consistent with musculoskeletal chest wall sprain, spasm. Chest pain is reproducible.  EKG without acute findings however lead V1 shows t wave inversion which is new.  He has a history hyperlipidemia and fortunately no strong family history of coronary artery disease.  We however did discuss risk factors of age, hyperlipidemia and the importance of further restratification and evaluation with cardiology.  He would likely benefit from stress test, CT calcium score. Referral has been placed.  Negative troponin, BNP is normal.  Counseled patient's son the importance of remaining vigilant as it relates to any changes in his symptoms or certainly more persistent chest pain, worsening chest pain ,exertional chest pain all of which would warrant emergency room evaluation.  He verbalized understanding of all      Relevant Orders   EKG 12-Lead (Completed)   DG Chest 2 View (Completed)   Ambulatory referral to Cardiology   Brain natriuretic peptide (Completed)   Troponin I (High Sensitivity) (Completed)      I have spent 30 minutes with a patient including precharting, exam, reviewing medical records, and discussion plan of care.     I am having Jaedan Huttner maintain  his Repatha SureClick.   No orders of the defined types were placed in this encounter.   Return precautions given.   Risks, benefits, and alternatives of the medications and treatment plan prescribed today were discussed, and patient expressed understanding.   Education regarding symptom management and diagnosis given to patient on AVS.  Continue to follow with [name], MD for routine health maintenance.   [name] and I agreed with plan.   Mable Paris, FNP

## 2021-12-23 NOTE — Assessment & Plan Note (Addendum)
Presentation and recent history as relates to exercise most consistent with musculoskeletal chest wall sprain, spasm. Chest pain is reproducible.  EKG without acute findings however lead V1 shows t wave inversion which is new.  He has a history hyperlipidemia and fortunately no strong family history of coronary artery disease.  We however did discuss risk factors of age, hyperlipidemia and the importance of further restratification and evaluation with cardiology.  He would likely benefit from stress test, CT calcium score. Referral has been placed.  Negative troponin, BNP is normal.  Counseled patient's son the importance of remaining vigilant as it relates to any changes in his symptoms or certainly more persistent chest pain, worsening chest pain ,exertional chest pain all of which would warrant emergency room evaluation.  He verbalized understanding of all

## 2021-12-25 ENCOUNTER — Encounter: Payer: Self-pay | Admitting: Cardiology

## 2021-12-25 ENCOUNTER — Ambulatory Visit: Payer: PPO | Admitting: Cardiology

## 2021-12-25 ENCOUNTER — Other Ambulatory Visit
Admission: RE | Admit: 2021-12-25 | Discharge: 2021-12-25 | Disposition: A | Discharge status: Home / Self Care | Payer: PPO | Attending: Cardiology | Admitting: Cardiology

## 2021-12-25 VITALS — BP 120/70 | HR 56 | Ht 68.0 in | Wt 167.0 lb

## 2021-12-25 DIAGNOSIS — R072 Precordial pain: Secondary | ICD-10-CM

## 2021-12-25 DIAGNOSIS — E7800 Pure hypercholesterolemia, unspecified: Secondary | ICD-10-CM | POA: Diagnosis not present

## 2021-12-25 DIAGNOSIS — Z125 Encounter for screening for malignant neoplasm of prostate: Secondary | ICD-10-CM

## 2021-12-25 LAB — BASIC METABOLIC PANEL
Anion gap: 8 (ref 5–15)
BUN: 19 mg/dL (ref 8–23)
CO2: 24 mmol/L (ref 22–32)
Calcium: 9.2 mg/dL (ref 8.9–10.3)
Chloride: 106 mmol/L (ref 98–111)
Creatinine, Ser: 1.26 mg/dL — ABNORMAL HIGH (ref 0.61–1.24)
GFR, Estimated: >60 mL/min (ref >60)
Glucose, Bld: 137 mg/dL — ABNORMAL HIGH (ref 70–99)
Potassium: 3.8 mmol/L (ref 3.5–5.1)
Sodium: 138 mmol/L (ref 135–145)

## 2021-12-25 MED ORDER — METOPROLOL TARTRATE 50 MG PO TABS: 50.0000 mg | ORAL_TABLET | Freq: Once | ORAL | 0 refills | Status: DC

## 2021-12-25 NOTE — Progress Notes (Signed)
Cardiology Office Note:    Date:  12/25/2021   ID:  Elijah Walker, DOB 1950-12-23, MRN [id number]  PCP:  [name], MD   [place] [facility] Cardiologist:  Kate Sable, MD     Referring MD: Burnard Hawthorne, FNP   Chief Complaint  Patient presents with   Chest Tightness    Ref by M. Arnett for chest tightness. Meds reviewed with patient.    Elijah Walker is a 71 y.o. male who is being seen today for the evaluation of chest pain at the request of Burnard Hawthorne, FNP.   History of Present Illness:    Elijah Walker is a 71 y.o. male with a hx of hyperlipidemia who presents due to chest pain.  Complains of chest pain ongoing over the past 2 to 3 months.  Recently started on an exercise regimen 9 months ago which involved weightlifting, running on treadmill.  Has noticed left-sided chest pain not always associated with exertion, sometimes reproducible with deep breaths and palpation.  He thinks he might of pulled a muscle but not sure.  Takes Repatha for cholesterol control.  Denies smoking, denies any history of heart disease.  Past Medical History:  Diagnosis Date   Blood in stool    H/O   History of chicken pox    Hyperlipidemia    PONV (postoperative nausea and vomiting)     Past Surgical History:  Procedure Laterality Date   CHOLECYSTECTOMY  1998   COLONOSCOPY     SHOULDER ARTHROSCOPY WITH BANKART REPAIR Left 09/14/2017   Procedure: SHOULDER ARTHROSCOPY WITH MINI OPEN ROTATOR CUFF REPAIR, SUBACROMINAL DECOMPRESSION, LABRAL REPAIR,BICEP TENOTOMY, DISTAL CLAVICLE EXCISION ;  Surgeon: Thornton Park, MD;  Location: [facility] ORS;  Service: Orthopedics;  Laterality: Left;   SHOULDER ARTHROSCOPY WITH OPEN ROTATOR CUFF REPAIR AND DISTAL CLAVICLE ACROMINECTOMY Right 01/16/2021   Procedure: RIGHT SHOULDER ARTHROSCOPY WITH MINI-OPEN ROTATOR CUFF REPAIR, SUBACCROMINAL DECOMPRESSION AND DISTAL CLAVICLE EXCISION;  Surgeon: Thornton Park, MD;  Location: [facility] OR[facility];   Service: Orthopedics;  Laterality: Right;    Current Medications: Current Meds  Medication Sig   metoprolol tartrate (LOPRESSOR) 50 MG tablet Take 1 tablet (50 mg total) by mouth once for 1 dose. Take 2 hours prior to your CT scan.   REPATHA SURECLICK 169 MG/ML SOAJ INJECT 140 MG INTO THE SKIN EVERY 14 (FOURTEEN) DAYS.     Allergies:   Patient has no known allergies.   Social History   Socioeconomic History   Marital status: Married    Spouse name: Not on file   Number of children: Not on file   Years of education: Not on file   Highest education level: Not on file  Occupational History   Not on file  Tobacco Use   Smoking status: Never   Smokeless tobacco: Never  Vaping Use   Vaping Use: Never used  Substance and Sexual Activity   Alcohol use: No    Alcohol/week: 0.0 standard drinks of alcohol    Comment: occ   Drug use: No   Sexual activity: Yes  Other Topics Concern   Not on file  Social History Narrative   Not on file   Social Determinants of Health   Financial Resource Strain: Low Risk  (05/26/2021)   Overall Financial Resource Strain (CARDIA)    Difficulty of Paying Living Expenses: Not hard at all  Food Insecurity: No Food Insecurity (05/26/2021)   Hunger Vital Sign    Worried About Running Out of Food in the Last  Year: Never true    [place] in the Last Year: Never true  Transportation Needs: No Transportation Needs (05/26/2021)   PRAPARE - Hydrologist (Medical): No    Lack of Transportation (Non-Medical): No  Physical Activity: Sufficiently Active (05/26/2021)   Exercise Vital Sign    Days of Exercise per Week: 4 days    Minutes of Exercise per Session: 150+ min  Stress: No Stress Concern Present (05/26/2021)   [place]    Feeling of Stress : Not at all  Social Connections: Unknown (05/26/2021)   Social Connection and Isolation Panel [NHANES]     Frequency of Communication with Friends and Family: Not on file    Frequency of Social Gatherings with Friends and Family: Not on file    Attends Religious Services: Not on file    Active Member of Clubs or Organizations: Not on file    Attends Archivist Meetings: Not on file    Marital Status: Married     Family History: The patient's family history includes Autoimmune disease in his brother; Colitis in his daughter and sister; Dementia in his mother; Diabetes in his father.  ROS:   Please see the history of present illness.     All other systems reviewed and are negative.  EKGs/Labs/Other Studies Reviewed:    The following studies were reviewed today:   EKG:  EKG is  ordered today.  The ekg ordered today demonstrates sinus bradycardia, otherwise normal, heart rate 56  Recent Labs: 11/21/2021: ALT 25; Hemoglobin 15.3; Platelets 189.0; TSH 2.94 12/02/2021: BUN 15; Creatinine, Ser 1.21; Potassium 4.0; Sodium 140 12/23/2021: B Natriuretic Peptide 11.4  Recent Lipid Panel    Component Value Date/Time   CHOL 141 11/21/2021 0916   TRIG 121.0 11/21/2021 0916   HDL 52.30 11/21/2021 0916   CHOLHDL 3 11/21/2021 0916   VLDL 24.2 11/21/2021 0916   LDLCALC 64 11/21/2021 0916     Risk Assessment/Calculations:         Physical Exam:    VS:  BP 120/70 (BP Location: Right Arm, Patient Position: Sitting, Cuff Size: Normal)   Pulse (!) 56   Ht '5\' 8"'$  (1.727 m)   Wt 167 lb (75.8 kg)   SpO2 96%   BMI 25.39 kg/m     Wt Readings from Last 3 Encounters:  12/25/21 167 lb (75.8 kg)  12/23/21 167 lb 3.2 oz (75.8 kg)  12/02/21 167 lb 9.6 oz (76 kg)     GEN:  Well nourished, well developed in no acute distress HEENT: Normal NECK: No JVD; No carotid bruits LYMPHATICS: No lymphadenopathy CARDIAC: RRR, no murmurs, rubs, gallops RESPIRATORY:  Clear to auscultation without rales, wheezing or rhonchi  ABDOMEN: Soft, non-tender, non-distended MUSCULOSKELETAL:  No edema; No  deformity  SKIN: Warm and dry NEUROLOGIC:  Alert and oriented x 3 PSYCHIATRIC:  Normal affect   ASSESSMENT:    1. Precordial pain   2. Pure hypercholesterolemia    PLAN:    In order of problems listed above:  Chest pain, risk factors hyperlipidemia, age.  Appears atypical, possibly musculoskeletal.  Wants to continue exercising.  Get echocardiogram, get coronary CTA.  Hyperlipidemia, cholesterol controlled.  Continue Repatha.  Follow-up after echo and coronary CTA       Medication Adjustments/Labs and Tests Ordered: Current medicines are reviewed at length with the patient today.  Concerns regarding medicines are outlined above.  Orders Placed This  Encounter  Procedures   CT CORONARY MORPH W/CTA COR W/SCORE W/CA W/CM &/OR WO/CM   Basic metabolic panel   EKG 32-KGUR   ECHOCARDIOGRAM COMPLETE   Meds ordered this encounter  Medications   metoprolol tartrate (LOPRESSOR) 50 MG tablet    Sig: Take 1 tablet (50 mg total) by mouth once for 1 dose. Take 2 hours prior to your CT scan.    Dispense:  1 tablet    Refill:  0    Patient Instructions  Medication Instructions:   Your physician recommends that you continue on your current medications as directed. Please refer to the Current Medication list given to you today.  *If you need a refill on your cardiac medications before your next appointment, please call your pharmacy*   Lab Work:  Please go to the [facility] after your appointment today for a lab (BMP) draw.   Testing/Procedures:  Your physician has requested that you have an echocardiogram. Echocardiography is a painless test that uses sound waves to create images of your heart. It provides your doctor with information about the size and shape of your heart and how well your heart's chambers and valves are working. This procedure takes approximately one hour. There are no restrictions for this procedure.  2.   Your physician has requested that you have cardiac  CT. Cardiac computed tomography (CT) is a painless test that uses an x-ray machine to take clear, detailed pictures of your heart.    Your cardiac CT will be scheduled at:  [facility] [address] [place], [place] [address] [phone]   Please arrive 15 mins early for check-in and test prep.    Please follow these instructions carefully (unless otherwise directed):   On the Night Before the Test: Be sure to Drink plenty of water. Do not consume any caffeinated/decaffeinated beverages or chocolate 12 hours prior to your test.   On the Day of the Test: Drink plenty of water until 1 hour prior to the test. Do not eat any food 4 hours prior to the test. You may take your regular medications prior to the test.  Take metoprolol (Lopressor) 50 MG two hours prior to test.       After the Test: Drink plenty of water. After receiving IV contrast, you may experience a mild flushed feeling. This is normal. On occasion, you may experience a mild rash up to 24 hours after the test. This is not dangerous. If this occurs, you can take Benadryl 25 mg and increase your fluid intake. If you experience trouble breathing, this can be serious. If it is severe call 911 IMMEDIATELY. If it is mild, please call our office. If you take any of these medications: Glipizide/Metformin, Avandament, Glucavance, please do not take 48 hours after completing test unless otherwise instructed.  Please allow 2-4 weeks for scheduling of routine cardiac CTs. Some insurance companies require a pre-authorization which may delay scheduling of this test.   For non-scheduling related questions, please contact the cardiac imaging nurse navigator should you have any questions/concerns: Marchia Bond, Cardiac Imaging Nurse Navigator Gordy Clement, Cardiac Imaging Nurse Navigator [facility] Direct Office Dial: [phone]   For scheduling  needs, including cancellations and rescheduling, please call [place], [phone].     Follow-Up: At [facility], you and your health needs are our priority.  As part of our continuing mission to provide you with exceptional heart care, we have created designated Provider  Care Teams.  These Care Teams include your primary Cardiologist (physician) and Advanced Practice Providers (APPs -  Physician Assistants and Nurse Practitioners) who all work together to provide you with the care you need, when you need it.  We recommend signing up for the patient portal called "MyChart".  Sign up information is provided on this After Visit Summary.  MyChart is used to connect with patients for Virtual Visits (Telemedicine).  Patients are able to view lab/test results, encounter notes, upcoming appointments, etc.  Non-urgent messages can be sent to your provider as well.   To learn more about what you can do with MyChart, go to [email or web address].    Your next appointment:   Follow up after testing   The format for your next appointment:   In Person  Provider:   You may see Kate Sable, MD or one of the following Advanced Practice Providers on your designated Care Team:   Murray Hodgkins, NP Christell Faith, PA-C Cadence Kathlen Mody, [place]    Other Instructions   Important Information About Sugar         Signed, Kate Sable, MD  12/25/2021 12:51 PM    [place]

## 2021-12-25 NOTE — Patient Instructions (Signed)
Medication Instructions:   Your physician recommends that you continue on your current medications as directed. Please refer to the Current Medication list given to you today.  *If you need a refill on your cardiac medications before your next appointment, please call your pharmacy*   Lab Work:  Please go to the [facility] after your appointment today for a lab (BMP) draw.   Testing/Procedures:  Your physician has requested that you have an echocardiogram. Echocardiography is a painless test that uses sound waves to create images of your heart. It provides your doctor with information about the size and shape of your heart and how well your heart's chambers and valves are working. This procedure takes approximately one hour. There are no restrictions for this procedure.  2.   Your physician has requested that you have cardiac CT. Cardiac computed tomography (CT) is a painless test that uses an x-ray machine to take clear, detailed pictures of your heart.    Your cardiac CT will be scheduled at:  [facility] [address] [place], [place] [address] [phone]   Please arrive 15 mins early for check-in and test prep.    Please follow these instructions carefully (unless otherwise directed):   On the Night Before the Test: Be sure to Drink plenty of water. Do not consume any caffeinated/decaffeinated beverages or chocolate 12 hours prior to your test.   On the Day of the Test: Drink plenty of water until 1 hour prior to the test. Do not eat any food 4 hours prior to the test. You may take your regular medications prior to the test.  Take metoprolol (Lopressor) 50 MG two hours prior to test.       After the Test: Drink plenty of water. After receiving IV contrast, you may experience a mild flushed feeling. This is normal. On occasion, you may experience a mild rash up to 24 hours after the test. This is not dangerous. If  this occurs, you can take Benadryl 25 mg and increase your fluid intake. If you experience trouble breathing, this can be serious. If it is severe call 911 IMMEDIATELY. If it is mild, please call our office. If you take any of these medications: Glipizide/Metformin, Avandament, Glucavance, please do not take 48 hours after completing test unless otherwise instructed.  Please allow 2-4 weeks for scheduling of routine cardiac CTs. Some insurance companies require a pre-authorization which may delay scheduling of this test.   For non-scheduling related questions, please contact the cardiac imaging nurse navigator should you have any questions/concerns: Marchia Bond, Cardiac Imaging Nurse Navigator Gordy Clement, Cardiac Imaging Nurse Navigator [facility] Direct Office Dial: [phone]   For scheduling needs, including cancellations and rescheduling, please call [place], [phone].     Follow-Up: At [facility], you and your health needs are our priority.  As part of our continuing mission to provide you with exceptional heart care, we have created designated Provider Care Teams.  These Care Teams include your primary Cardiologist (physician) and Advanced Practice Providers (APPs -  Physician Assistants and Nurse Practitioners) who all work together to provide you with the care you need, when you need it.  We recommend signing up for the patient portal called "MyChart".  Sign up information is provided on this After Visit Summary.  MyChart is used to connect with patients for Virtual Visits (Telemedicine).  Patients are able to view lab/test results, encounter notes, upcoming appointments, etc.  Non-urgent  messages can be sent to your provider as well.   To learn more about what you can do with MyChart, go to [email or web address].    Your next appointment:   Follow up after testing   The format for your next appointment:   In Person  Provider:   You  may see Kate Sable, MD or one of the following Advanced Practice Providers on your designated Care Team:   Murray Hodgkins, NP Christell Faith, PA-C Cadence Kathlen Mody, [place]    Other Instructions   Important Information About Sugar

## 2021-12-26 ENCOUNTER — Telehealth (HOSPITAL_COMMUNITY): Payer: Self-pay | Admitting: *Deleted

## 2021-12-26 NOTE — Telephone Encounter (Signed)
Reaching out to patient to offer assistance regarding upcoming cardiac imaging study; pt verbalizes understanding of appt date/time, parking situation and where to check in, pre-test NPO status and medications ordered, and verified current allergies; name and call back number provided for further questions should they arise  Gordy Clement RN Navigator Cardiac [place] [facility] [phone] office [phone] cell  Patient to take '25mg'$  metoprolol if HR is greater than 55bpm and '50mg'$  metoprolol if HR is greater than 65bpm TWO hours prior to his cardiac CT scan.

## 2021-12-29 ENCOUNTER — Ambulatory Visit
Admission: RE | Admit: 2021-12-29 | Discharge: 2021-12-29 | Disposition: A | Discharge status: Home / Self Care | Payer: PPO | Source: Ambulatory Visit | Attending: Cardiology | Admitting: Cardiology

## 2021-12-29 DIAGNOSIS — R072 Precordial pain: Secondary | ICD-10-CM | POA: Diagnosis not present

## 2021-12-29 DIAGNOSIS — R931 Abnormal findings on diagnostic imaging of heart and coronary circulation: Secondary | ICD-10-CM | POA: Insufficient documentation

## 2021-12-29 DIAGNOSIS — I2510 Atherosclerotic heart disease of native coronary artery without angina pectoris: Secondary | ICD-10-CM | POA: Diagnosis not present

## 2021-12-29 MED ORDER — NITROGLYCERIN 0.4 MG SL SUBL
0.8000 mg | SUBLINGUAL_TABLET | Freq: Once | SUBLINGUAL | Status: AC
  Administered 2021-12-29: 0.8 mg via SUBLINGUAL

## 2021-12-29 MED ORDER — IOHEXOL 350 MG/ML SOLN
75.0000 mL | Freq: Once | INTRAVENOUS | Status: AC | PRN
  Administered 2021-12-29: 75 mL via INTRAVENOUS

## 2021-12-29 NOTE — Progress Notes (Signed)
Patient tolerated procedure well. Ambulate w/o difficulty. Denies light headedness or being dizzy. Sitting in chair drinking water provided. Encouraged to drink extra water today and reasoning explained. Verbalized understanding. All questions answered. ABC intact. No further needs. Discharge from procedure area w/o issues.   °

## 2021-12-30 ENCOUNTER — Other Ambulatory Visit: Payer: Self-pay | Admitting: Cardiology

## 2021-12-30 DIAGNOSIS — I2510 Atherosclerotic heart disease of native coronary artery without angina pectoris: Secondary | ICD-10-CM | POA: Diagnosis not present

## 2021-12-30 DIAGNOSIS — R931 Abnormal findings on diagnostic imaging of heart and coronary circulation: Secondary | ICD-10-CM | POA: Diagnosis not present

## 2022-01-06 ENCOUNTER — Telehealth: Payer: Self-pay

## 2022-01-06 ENCOUNTER — Encounter: Payer: Self-pay | Admitting: Internal Medicine

## 2022-01-06 ENCOUNTER — Ambulatory Visit: Payer: PPO | Admitting: Family

## 2022-01-06 ENCOUNTER — Ambulatory Visit (INDEPENDENT_AMBULATORY_CARE_PROVIDER_SITE_OTHER): Payer: PPO | Admitting: Internal Medicine

## 2022-01-06 DIAGNOSIS — R911 Solitary pulmonary nodule: Secondary | ICD-10-CM | POA: Diagnosis not present

## 2022-01-06 DIAGNOSIS — E7800 Pure hypercholesterolemia, unspecified: Secondary | ICD-10-CM | POA: Diagnosis not present

## 2022-01-06 DIAGNOSIS — T466X5A Adverse effect of antihyperlipidemic and antiarteriosclerotic drugs, initial encounter: Secondary | ICD-10-CM

## 2022-01-06 DIAGNOSIS — R931 Abnormal findings on diagnostic imaging of heart and coronary circulation: Secondary | ICD-10-CM | POA: Diagnosis not present

## 2022-01-06 DIAGNOSIS — M791 Myalgia, unspecified site: Secondary | ICD-10-CM | POA: Diagnosis not present

## 2022-01-06 DIAGNOSIS — R739 Hyperglycemia, unspecified: Secondary | ICD-10-CM

## 2022-01-06 MED ORDER — ASPIRIN 81 MG PO TBEC: 81.0000 mg | DELAYED_RELEASE_TABLET | Freq: Every day | ORAL | 3 refills | Status: DC

## 2022-01-06 NOTE — Progress Notes (Signed)
Patient ID: Artis Beggs, male   DOB: 24-Apr-1951, 71 y.o.   MRN: [id number]   Subjective:    Patient ID: Aaiden Depoy, male    DOB: 07-Nov-1950, 71 y.o.   MRN: [id number]   Patient here for work in appt.   Chief Complaint  Patient presents with   Follow-up    Discuss Calcium Score   .   HPI Had recent CT calcium score.  Had questions regarding the results.  Coronary calcium score 299.  CT reviewed.  He has adjusted his diet.  Is exercising.  Doing aerobic activity alternating with lifting.  No chest pain or sob with increased activity or exertion.  No cough or congestion.  Since exercising, joint pain is better.  No abdominal pain or bowel issues reported.  Doing well with repatha.  Blood pressure doing well.    Past Medical History:  Diagnosis Date   Blood in stool    H/O   History of chicken pox    Hyperlipidemia    PONV (postoperative nausea and vomiting)    Past Surgical History:  Procedure Laterality Date   CHOLECYSTECTOMY  1998   COLONOSCOPY     SHOULDER ARTHROSCOPY WITH BANKART REPAIR Left 09/14/2017   Procedure: SHOULDER ARTHROSCOPY WITH MINI OPEN ROTATOR CUFF REPAIR, SUBACROMINAL DECOMPRESSION, LABRAL REPAIR,BICEP TENOTOMY, DISTAL CLAVICLE EXCISION ;  Surgeon: Thornton Park, MD;  Location: [facility];  Service: Orthopedics;  Laterality: Left;   SHOULDER ARTHROSCOPY WITH OPEN ROTATOR CUFF REPAIR AND DISTAL CLAVICLE ACROMINECTOMY Right 01/16/2021   Procedure: RIGHT SHOULDER ARTHROSCOPY WITH MINI-OPEN ROTATOR CUFF REPAIR, SUBACCROMINAL DECOMPRESSION AND DISTAL CLAVICLE EXCISION;  Surgeon: Thornton Park, MD;  Location: [facility];  Service: Orthopedics;  Laterality: Right;   Family History  Problem Relation Age of Onset   Dementia Mother    Diabetes Father    Colitis Sister    Autoimmune disease Brother    Colitis Daughter    Social History   Socioeconomic History   Marital status: Married    Spouse name: Not on file   Number of children: Not on file   Years of  education: Not on file   Highest education level: Not on file  Occupational History   Not on file  Tobacco Use   Smoking status: Never   Smokeless tobacco: Never  Vaping Use   Vaping Use: Never used  Substance and Sexual Activity   Alcohol use: No    Alcohol/week: 0.0 standard drinks of alcohol    Comment: occ   Drug use: No   Sexual activity: Yes  Other Topics Concern   Not on file  Social History Narrative   Not on file   Social Determinants of Health   Financial Resource Strain: Low Risk  (05/26/2021)   Overall Financial Resource Strain (CARDIA)    Difficulty of Paying Living Expenses: Not hard at all  Food Insecurity: No Food Insecurity (05/26/2021)   Hunger Vital Sign    Worried About Running Out of Food in the Last Year: Never true    [place] in the Last Year: Never true  Transportation Needs: No Transportation Needs (05/26/2021)   PRAPARE - Hydrologist (Medical): No    Lack of Transportation (Non-Medical): No  Physical Activity: Sufficiently Active (05/26/2021)   Exercise Vital Sign    Days of Exercise per Week: 4 days    Minutes of Exercise per Session: 150+ min  Stress: No Stress Concern Present (05/26/2021)   [employer]  [employer]up[employer] - Occupational Stress Questionnaire    Feeling of Stress : Not at all  Social Connections: Unknown (05/26/2021)   Social Connection and Isolation Panel [NHANES]    Frequency of Communication with Friends and Family: Not on file    Frequency of Social Gatherings with Friends and Family: Not on file    Attends Religious Services: Not on file    Active Member of Clubs or Organizations: Not on file    Attends Archivist Meetings: Not on file    Marital Status: Married     Review of Systems  Constitutional:  Negative for appetite change and unexpected weight change.  HENT:  Negative for congestion and sinus pressure.   Respiratory:  Negative for cough, chest  tightness and shortness of breath.   Cardiovascular:  Negative for chest pain, palpitations and leg swelling.  Gastrointestinal:  Negative for abdominal pain, diarrhea, nausea and vomiting.  Genitourinary:  Negative for difficulty urinating and dysuria.  Musculoskeletal:  Negative for joint swelling and myalgias.  Skin:  Negative for color change and rash.  Neurological:  Negative for dizziness, light-headedness and headaches.  Psychiatric/Behavioral:  Negative for agitation and dysphoric mood.        Objective:     BP 140/80 (BP Location: Left Arm, Patient Position: Sitting, Cuff Size: Normal)   Pulse 64   Temp 98.4 F (36.9 C) (Oral)   Resp 18   Ht '5\' 8"'  (1.727 m)   Wt 168 lb 6.4 oz (76.4 kg)   SpO2 98%   BMI 25.61 kg/m  Wt Readings from Last 3 Encounters:  01/06/22 168 lb 6.4 oz (76.4 kg)  12/25/21 167 lb (75.8 kg)  12/23/21 167 lb 3.2 oz (75.8 kg)    Physical Exam Constitutional:      General: He is not in acute distress.    Appearance: Normal appearance. He is well-developed.  HENT:     Head: Normocephalic and atraumatic.     Right Ear: External ear normal.     Left Ear: External ear normal.  Eyes:     General: No scleral icterus.       Right eye: No discharge.        Left eye: No discharge.  Cardiovascular:     Rate and Rhythm: Normal rate and regular rhythm.  Pulmonary:     Effort: Pulmonary effort is normal. No respiratory distress.     Breath sounds: Normal breath sounds.  Abdominal:     General: Bowel sounds are normal.     Palpations: Abdomen is soft.     Tenderness: There is no abdominal tenderness.  Musculoskeletal:        General: No swelling or tenderness.     Cervical back: Neck supple. No tenderness.  Lymphadenopathy:     Cervical: No cervical adenopathy.  Skin:    Findings: No erythema or rash.  Neurological:     Mental Status: He is alert.  Psychiatric:        Mood and Affect: Mood normal.        Behavior: Behavior normal.       Outpatient Encounter Medications as of 01/06/2022  Medication Sig   MAGNESIUM PO Take by mouth.   Menaquinone-7 (VITAMIN K2 PO) Take 200 mg by mouth.   Multiple Vitamins-Minerals (VITAMIN D3 COMPLETE PO) Take by mouth.   Multiple Vitamins-Minerals (ZINC PO) Take by mouth.   Omega-3 Fatty Acids (OMEGA 3 PO) Take by mouth.   REPATHA SURECLICK 932 MG/ML SOAJ INJECT  140 MG INTO THE SKIN EVERY 14 (FOURTEEN) DAYS.   [DISCONTINUED] metoprolol tartrate (LOPRESSOR) 50 MG tablet Take 1 tablet (50 mg total) by mouth once for 1 dose. Take 2 hours prior to your CT scan.   No facility-administered encounter medications on file as of 01/06/2022.     Lab Results  Component Value Date   WBC 4.7 11/21/2021   HGB 15.3 11/21/2021   HCT 46.3 11/21/2021   PLT 189.0 11/21/2021   GLUCOSE 137 (H) 12/25/2021   CHOL 141 11/21/2021   TRIG 121.0 11/21/2021   HDL 52.30 11/21/2021   LDLCALC 64 11/21/2021   ALT 25 11/21/2021   AST 19 11/21/2021   NA 138 12/25/2021   K 3.8 12/25/2021   CL 106 12/25/2021   CREATININE 1.26 (H) 12/25/2021   BUN 19 12/25/2021   CO2 24 12/25/2021   TSH 2.94 11/21/2021   PSA 1.78 06/02/2021   INR 1.0 08/30/2017   HGBA1C 6.0 11/21/2021   MICROALBUR <0.7 07/20/2016    CT CORONARY FRACTIONAL FLOW RESERVE FLUID ANALYSIS  Result Date: 12/30/2021 EXAM: CT FFR ANALYSIS CLINICAL DATA:  Abnormal CCTA FINDINGS: FFRct analysis was performed on the original cardiac CT angiogram dataset. Diagrammatic representation of the FFRct analysis is provided in a separate PDF document in PACS. This dictation was created using the PDF document and an interactive 3D model of the results. 3D model is not available in the EMR/PACS. Normal FFR range is >0.80. 1. Left Main:  No significant stenosis. 2. LAD: No significant focal stenosis. 3. LCX: No significant stenosis.  FFRct 0.89 4. RCA: No significant stenosis.  FFRct 0.88 IMPRESSION: 1.  CT FFR analysis didn't show any significant stenosis.  Electronically Signed   By: Kate Sable M.D.   On: 12/30/2021 10:28   CT CORONARY MORPH W/CTA COR W/SCORE W/CA W/CM &/OR WO/CM  Addendum Date: 12/30/2021   ADDENDUM REPORT: 12/30/2021 09:29 EXAM: OVER-READ INTERPRETATION  CT CHEST The following report is an over-read performed by radiologist Dr. Norlene Duel [facility] on 12/30/2021. This over-read does not include interpretation of cardiac or coronary anatomy or pathology. The coronary calcium score/coronary CTA interpretation by the cardiologist is attached. COMPARISON:  CT chest 08/07/2015 FINDINGS: Vascular: No acute abnormality. Mediastinum/nodes: No mediastinal mass or adenopathy identified. Lungs/pleura:No pleural effusion, airspace consolidation, atelectasis, or pneumothorax identified. Within the right lung base there is a well-circumscribed, smoothly marginated, solid nodule containing central calcification measuring 1.8 cm. On 08/07/2015 this nodule measured 1.5 cm. Upper abdomen: No acute abnormality.  Previous cholecystectomy. Musculoskeletal: No acute or suspicious osseous findings. IMPRESSION: No acute, noncardiac supplemental findings identified. Right lower lobe nodule is smoothly marginated containing central calcification. This is mildly increased when compared with 2015 (on the order of 3 mm) and is favored to represent a benign indolent nodule such as pulmonary hamartoma or granuloma. Electronically Signed   By: Kerby Moors M.D.   On: 12/30/2021 09:29   Result Date: 12/30/2021 CLINICAL DATA:  Chest pain EXAM: Cardiac/Coronary  CTA TECHNIQUE: The patient was scanned on a Siemens Somatom go.Top scanner. : A retrospective scan was triggered in the descending thoracic aorta. Axial non-contrast 3 mm slices were carried out through the heart. The data set was analyzed on a dedicated work station and scored using the Derma. Gantry rotation speed was 330 msecs and collimation was .6 mm. 25 mg of metoprolol and  0.8 mg of sl NTG was given. The 3D data set was reconstructed in 5% intervals of the 60-95 %  of the R-R cycle. Diastolic phases were analyzed on a dedicated work station using MPR, MIP and VRT modes. The patient received 75 cc of contrast. FINDINGS: Aorta: Normal size. Aortic root and descending aorta calcifications. No dissection. Aortic Valve:  Trileaflet.  No calcifications. Coronary Arteries:  Normal coronary origin.  Right dominance. RCA is a dominant artery that gives rise to PDA and PLA. There is calcified plaque in the mid RCA causing minimal stenosis (<25%). Left main gives rise to LAD and LCX arteries.  LM has no stenosis LAD has calcified plaque in the proximal segment causing mild stenosis (25-49%). LCX is a non-dominant artery that gives rise to two obtuse marginal branches. There is calcified plaque in the proximal LCx causing moderate stenosis (50-69%). Other findings: Normal pulmonary vein drainage into the left atrium. Normal left atrial appendage without a thrombus. Normal size of the pulmonary artery. IMPRESSION: 1. Coronary calcium score of 299. This was 63rd percentile for age and sex matched control. 2. Normal coronary origin with right dominance. 3. Calcified plaque in the proximal LCx causing moderate stenosis (50-69%). 4. Calcified plaque in the proximal LAD causing mild stenosis (25-49%). 5. CAD-RADS 3. Moderate stenosis. Consider symptom-guided anti-ischemic pharmacotherapy as well as risk factor modification per guideline directed care. 6. Additional analysis with CT FFR will be submitted and reported separately. Electronically Signed: By: Kate Sable M.D. On: 12/29/2021 16:45       Assessment & Plan:   Problem List Items Addressed This Visit     Elevated coronary artery calcium score    Discussed results.  Discussed risk factor modification.  Continue repatha.  Start aspirin daily. Keep sugars under control.  Follow.       Hypercholesterolemia    On repatha.  Monitors  diet.  Cholesterol much improved.  Follow lipid panel.   Lab Results  Component Value Date   CHOL 141 11/21/2021   HDL 52.30 11/21/2021   LDLCALC 64 11/21/2021   TRIG 121.0 11/21/2021   CHOLHDL 3 11/21/2021       Hyperglycemia    Low carb diet and exercise.  Follow met b and a1c.       Lung nodule    Recent CT as outlined.  Relatively stable.  See CT report.       Myalgia due to statin    Intolerant to statins.  On repatha. Doing well.         [name], MD

## 2022-01-09 ENCOUNTER — Encounter: Payer: Self-pay | Admitting: Internal Medicine

## 2022-01-11 ENCOUNTER — Encounter: Payer: Self-pay | Admitting: Internal Medicine

## 2022-01-11 DIAGNOSIS — R931 Abnormal findings on diagnostic imaging of heart and coronary circulation: Secondary | ICD-10-CM | POA: Insufficient documentation

## 2022-01-11 DIAGNOSIS — T466X5A Adverse effect of antihyperlipidemic and antiarteriosclerotic drugs, initial encounter: Secondary | ICD-10-CM | POA: Insufficient documentation

## 2022-01-11 NOTE — Assessment & Plan Note (Signed)
Recent CT as outlined.  Relatively stable.  See CT report.

## 2022-01-11 NOTE — Assessment & Plan Note (Signed)
Intolerant to statins.  On repatha. Doing well.

## 2022-01-11 NOTE — Assessment & Plan Note (Addendum)
Discussed results.  Discussed risk factor modification.  Continue repatha.  Start aspirin daily. Keep sugars under control.  Follow.

## 2022-01-11 NOTE — Assessment & Plan Note (Signed)
Low carb diet and exercise.  Follow met b and a1c.  

## 2022-01-11 NOTE — Assessment & Plan Note (Signed)
On repatha.  Monitors diet.  Cholesterol much improved.  Follow lipid panel.   Lab Results  Component Value Date   CHOL 141 11/21/2021   HDL 52.30 11/21/2021   LDLCALC 64 11/21/2021   TRIG 121.0 11/21/2021   CHOLHDL 3 11/21/2021

## 2022-01-22 ENCOUNTER — Ambulatory Visit (INDEPENDENT_AMBULATORY_CARE_PROVIDER_SITE_OTHER): Payer: PPO

## 2022-01-22 DIAGNOSIS — R072 Precordial pain: Secondary | ICD-10-CM | POA: Diagnosis not present

## 2022-01-22 LAB — ECHOCARDIOGRAM COMPLETE
AR max vel: 2.22 cm2
AV Area VTI: 2.25 cm2
AV Area mean vel: 2.19 cm2
AV Mean grad: 5 mmHg
AV Peak grad: 10.1 mmHg
Ao pk vel: 1.59 m/s
Area-P 1/2: 3.74 cm2
Calc EF: 55.4 %
S' Lateral: 2.8 cm
Single Plane A2C EF: 54 %
Single Plane A4C EF: 54.2 %

## 2022-01-26 NOTE — Progress Notes (Signed)
Cardiology Clinic Note   Patient Name: Elijah Walker Date of Encounter: 01/29/2022  Primary Care Provider:  [name], MD Primary Cardiologist:  Kate Sable, MD  Patient Profile    71 year old male with a past medical history of pure hypercholesterolemia and been suffering from precordial pain who underwent outpatient testing and is here today to follow-up on his pericardial pain and results from previous cardiac testing.  Past Medical History    Past Medical History:  Diagnosis Date   Blood in stool    H/O   History of chicken pox    Hyperlipidemia    PONV (postoperative nausea and vomiting)    Past Surgical History:  Procedure Laterality Date   CHOLECYSTECTOMY  1998   COLONOSCOPY     SHOULDER ARTHROSCOPY WITH BANKART REPAIR Left 09/14/2017   Procedure: SHOULDER ARTHROSCOPY WITH MINI OPEN ROTATOR CUFF REPAIR, SUBACROMINAL DECOMPRESSION, LABRAL REPAIR,BICEP TENOTOMY, DISTAL CLAVICLE EXCISION ;  Surgeon: Thornton Park, MD;  Location: [facility];  Service: Orthopedics;  Laterality: Left;   SHOULDER ARTHROSCOPY WITH OPEN ROTATOR CUFF REPAIR AND DISTAL CLAVICLE ACROMINECTOMY Right 01/16/2021   Procedure: RIGHT SHOULDER ARTHROSCOPY WITH MINI-OPEN ROTATOR CUFF REPAIR, SUBACCROMINAL DECOMPRESSION AND DISTAL CLAVICLE EXCISION;  Surgeon: Thornton Park, MD;  Location: [facility];  Service: Orthopedics;  Laterality: Right;    Allergies  No Known Allergies  History of Present Illness    71 year old male with a previous past medical history of pure hypercholesterolemia that has been taking Repatha but continues to manage LDL in the 60's.  He had no known previous cardiac history but unfortunately been suffering from chest tightness that been ongoing over the past 2 to 3 months.  He stated he recently started an exercise regimen approximately 9 months prior which involved weightlifting and running on the treadmill.  He first noticed left-sided chest discomfort was not always  associated with exertion sometimes it was reproducible with deep breathing and palpation.  At first he felt that he may have pulled a muscle but he was unsure and was urged by family medicine to keep his appointment for consultation.  So he did undergo coronary CTA which revealed left main with no significant stenosis, LAD no significant focal stenosis, the left circumflex had no significant stenosis with FFR of 0.89 in the RCA with no significant stenosis with FFR 0.88 so the impression of the scan with CT FFR analysis did not show any significant stenosis.  He also underwent echocardiogram which revealed LVEF of 6065%, G2 DD,  and mild MR. He is back in clinic today to discuss his chest discomfort and the results of his outpatient cardiac testing. He has modified his exercise regimen and his no longer doing an increased amount of weightlifting and his pain has resolved. He has previously had surgery to both shoulders and believes that he had pulled a muscle as the pain that ran the width of his chest from shoulder to shoulder. 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   Home Medications    Current Outpatient Medications  Medication Sig Dispense Refill   aspirin EC 81 MG tablet Take 1 tablet (81 mg total) by mouth daily. Swallow whole. 90 tablet 3   MAGNESIUM PO Take by mouth.     Menaquinone-7 (VITAMIN K2 PO) Take 200 mg by mouth.     Multiple Vitamins-Minerals (VITAMIN D3 COMPLETE PO) Take by mouth.     Multiple Vitamins-Minerals (ZINC PO) Take by mouth.  Omega-3 Fatty Acids (OMEGA 3 PO) Take by mouth.     REPATHA SURECLICK 161 MG/ML SOAJ INJECT 140 MG INTO THE SKIN EVERY 14 (FOURTEEN) DAYS. 6 mL 1   No current facility-administered medications for this visit.     Family History    Family History  Problem Relation Age of Onset   Dementia Mother    Diabetes Father    Colitis Sister    Autoimmune disease Brother    Colitis  Daughter    He indicated that his mother is deceased. He indicated that his father is deceased. He indicated that his sister is alive. He indicated that his brother is alive. He indicated that his daughter is alive.  Social History    Social History   Socioeconomic History   Marital status: Married    Spouse name: Not on file   Number of children: Not on file   Years of education: Not on file   Highest education level: Not on file  Occupational History   Not on file  Tobacco Use   Smoking status: Never   Smokeless tobacco: Never  Vaping Use   Vaping Use: Never used  Substance and Sexual Activity   Alcohol use: No    Alcohol/week: 0.0 standard drinks of alcohol    Comment: occ   Drug use: No   Sexual activity: Yes  Other Topics Concern   Not on file  Social History Narrative   Not on file   Social Determinants of Health   Financial Resource Strain: Low Risk  (05/26/2021)   Overall Financial Resource Strain (CARDIA)    Difficulty of Paying Living Expenses: Not hard at all  Food Insecurity: No Food Insecurity (05/26/2021)   Hunger Vital Sign    Worried About Running Out of Food in the Last Year: Never true    Ran Out of Food in the Last Year: Never true  Transportation Needs: No Transportation Needs (05/26/2021)   PRAPARE - Hydrologist (Medical): No    Lack of Transportation (Non-Medical): No  Physical Activity: Sufficiently Active (05/26/2021)   Exercise Vital Sign    Days of Exercise per Week: 4 days    Minutes of Exercise per Session: 150+ min  Stress: No Stress Concern Present (05/26/2021)   [place]    Feeling of Stress : Not at all  Social Connections: Unknown (05/26/2021)   Social Connection and Isolation Panel [NHANES]    Frequency of Communication with Friends and Family: Not on file    Frequency of Social Gatherings with Friends and Family: Not on file     Attends Religious Services: Not on file    Active Member of Clubs or Organizations: Not on file    Attends Archivist Meetings: Not on file    Marital Status: Married  Intimate Partner Violence: Not At Risk (05/26/2021)   Humiliation, Afraid, Rape, and Kick questionnaire    Fear of Current or Ex-Partner: No    Emotionally Abused: No    Physically Abused: No    Sexually Abused: No     Review of Systems    General:  No chills, fever, night sweats or weight changes.  Cardiovascular:  No chest pain, dyspnea on exertion, edema, orthopnea, palpitations, paroxysmal nocturnal dyspnea. Dermatological: No rash, lesions/masses Respiratory: No cough, dyspnea Urologic: No hematuria, dysuria Abdominal:   No nausea, vomiting, diarrhea, bright red blood per rectum, melena, or hematemesis Neurologic:  No  visual changes, wkns, changes in mental status. All other systems reviewed and are otherwise negative except as noted above.     Physical Exam    VS:  BP 130/66   Pulse (!) 50   Ht '5\' 8"'$  (1.727 m)   Wt 165 lb 3.2 oz (74.9 kg)   BMI 25.12 kg/m  , BMI Body mass index is 25.12 kg/m.     GEN: Well nourished, well developed, in no acute distress. HEENT: normal. Neck: Supple, no JVD, carotid bruits, or masses. Cardiac: RRR, bradycardia, no murmurs, rubs, or gallops. No clubbing, cyanosis, edema.  Radials/DP/PT 2+ and equal bilaterally.  Respiratory:  Respirations regular and unlabored, clear to auscultation bilaterally. GI: Soft, nontender, nondistended, BS + x 4. MS: no deformity or atrophy. Skin: warm and dry, no rash. Neuro:  Strength and sensation are intact. Psych: Normal affect.  Accessory Clinical Findings    ECG personally reviewed by me today- sinus bradycardia rate of 50 with left axis deviation - No acute changes  Lab Results  Component Value Date   WBC 4.7 11/21/2021   HGB 15.3 11/21/2021   HCT 46.3 11/21/2021   MCV 95.5 11/21/2021   PLT 189.0 11/21/2021   Lab  Results  Component Value Date   CREATININE 1.26 (H) 12/25/2021   BUN 19 12/25/2021   NA 138 12/25/2021   K 3.8 12/25/2021   CL 106 12/25/2021   CO2 24 12/25/2021   Lab Results  Component Value Date   ALT 25 11/21/2021   AST 19 11/21/2021   ALKPHOS 78 11/21/2021   BILITOT 0.7 11/21/2021   Lab Results  Component Value Date   CHOL 141 11/21/2021   HDL 52.30 11/21/2021   LDLCALC 64 11/21/2021   TRIG 121.0 11/21/2021   CHOLHDL 3 11/21/2021    Lab Results  Component Value Date   HGBA1C 6.0 11/21/2021    Assessment & Plan   1.  Chest pain- resolved. Patient has stopped lifting weights and has not had any other reported episodes of chest discomfort. He did undergo coronary CTA with results discussed of moderate nonobstructive coronary artery disease.  He was encouraged to continue with his exercise regimen, dietary changes, and aspirin therapy of 81 mg daily.  He was advised by his PCP of borderline CKD and wanted to ensure that aspirin was safe for him to take with his fluctuating kidney function as he was advised not to take NSAIDs.  Patient was advised on not taking ibuprofen, Aleve, BC powders but was encouraged to take his aspirin therapy as directed.  2.  Pure hypercholesterolemia with his last LDL of 64 on 11/21/2021 which is under the goal of 70.  He has been encouraged to continue with his dietary changes, exercise regimen, and continue with his Repatha.  He is also to continue follow-up with his PCP who has been managing his hypercholesterolemia.  3.Sinus bradycardia found on EKG. Patient asymptomatic. Stable. Patient currently is not on any AVN blocking agents. Continue to monitor.   Disposition patient to return back to clinic in 6 months to see Dr. Garen Lah or APP.   Kristain Hu, NP 01/29/2022, 9:28 AM

## 2022-01-29 ENCOUNTER — Ambulatory Visit: Payer: PPO | Admitting: Cardiology

## 2022-01-29 ENCOUNTER — Encounter: Payer: Self-pay | Admitting: Physician Assistant

## 2022-01-29 ENCOUNTER — Encounter: Payer: Self-pay | Admitting: Internal Medicine

## 2022-01-29 VITALS — BP 130/66 | HR 50 | Ht 68.0 in | Wt 165.2 lb

## 2022-01-29 DIAGNOSIS — E7800 Pure hypercholesterolemia, unspecified: Secondary | ICD-10-CM

## 2022-01-29 DIAGNOSIS — R001 Bradycardia, unspecified: Secondary | ICD-10-CM | POA: Diagnosis not present

## 2022-01-29 DIAGNOSIS — R072 Precordial pain: Secondary | ICD-10-CM

## 2022-01-29 NOTE — Patient Instructions (Addendum)
Medication Instructions:  No changes at this time.   *If you need a refill on your cardiac medications before your next appointment, please call your pharmacy*   Lab Work: None  If you have labs (blood work) drawn today and your tests are completely normal, you will receive your results only by: [place] (if you have MyChart) OR A paper copy in the mail If you have any lab test that is abnormal or we need to change your treatment, we will call you to review the results.   Testing/Procedures: None   Follow-Up: At [facility], you and your health needs are our priority.  As part of our continuing mission to provide you with exceptional heart care, we have created designated Provider Care Teams.  These Care Teams include your primary Cardiologist (physician) and Advanced Practice Providers (APPs -  Physician Assistants and Nurse Practitioners) who all work together to provide you with the care you need, when you need it.   Your next appointment:   6 month(s)  The format for your next appointment:   In Person  Provider:   Kate Sable, MD or Christell Faith, PA-C      Important Information About Sugar

## 2022-02-01 ENCOUNTER — Encounter: Payer: Self-pay | Admitting: Internal Medicine

## 2022-02-03 ENCOUNTER — Ambulatory Visit: Payer: PPO | Admitting: Internal Medicine

## 2022-02-05 ENCOUNTER — Ambulatory Visit: Payer: PPO | Admitting: Internal Medicine

## 2022-02-05 ENCOUNTER — Ambulatory Visit: Payer: PPO | Admitting: Family Medicine

## 2022-02-22 ENCOUNTER — Encounter: Payer: Self-pay | Admitting: Internal Medicine

## 2022-03-17 ENCOUNTER — Encounter: Payer: Self-pay | Admitting: Internal Medicine

## 2022-03-24 ENCOUNTER — Encounter: Payer: Self-pay | Admitting: Internal Medicine

## 2022-03-24 ENCOUNTER — Other Ambulatory Visit (INDEPENDENT_AMBULATORY_CARE_PROVIDER_SITE_OTHER): Payer: PPO

## 2022-03-24 DIAGNOSIS — R739 Hyperglycemia, unspecified: Secondary | ICD-10-CM | POA: Diagnosis not present

## 2022-03-24 DIAGNOSIS — E7800 Pure hypercholesterolemia, unspecified: Secondary | ICD-10-CM

## 2022-03-24 DIAGNOSIS — R944 Abnormal results of kidney function studies: Secondary | ICD-10-CM

## 2022-03-24 LAB — BASIC METABOLIC PANEL
BUN: 20 mg/dL (ref 6–23)
CO2: 27 mEq/L (ref 19–32)
Calcium: 9.4 mg/dL (ref 8.4–10.5)
Chloride: 103 mEq/L (ref 96–112)
Creatinine, Ser: 1.55 mg/dL — ABNORMAL HIGH (ref 0.40–1.50)
GFR: 45 mL/min — ABNORMAL LOW (ref >60.00)
Glucose, Bld: 90 mg/dL (ref 70–99)
Potassium: 3.8 mEq/L (ref 3.5–5.1)
Sodium: 140 mEq/L (ref 135–145)

## 2022-03-24 LAB — HEPATIC FUNCTION PANEL
ALT: 18 U/L (ref 0–53)
AST: 19 U/L (ref 0–37)
Albumin: 4.3 g/dL (ref 3.5–5.2)
Alkaline Phosphatase: 60 U/L (ref 39–117)
Bilirubin, Direct: 0.2 mg/dL (ref 0.0–0.3)
Total Bilirubin: 1.1 mg/dL (ref 0.2–1.2)
Total Protein: 6.6 g/dL (ref 6.0–8.3)

## 2022-03-24 LAB — LIPID PANEL
Cholesterol: 138 mg/dL (ref 0–200)
HDL: 56.2 mg/dL (ref >39.00)
LDL Cholesterol: 61 mg/dL (ref 0–99)
NonHDL: 81.9
Total CHOL/HDL Ratio: 2
Triglycerides: 107 mg/dL (ref 0.0–149.0)
VLDL: 21.4 mg/dL (ref 0.0–40.0)

## 2022-03-24 LAB — HEMOGLOBIN A1C: Hgb A1c MFr Bld: 6.1 % (ref 4.6–6.5)

## 2022-03-25 NOTE — Telephone Encounter (Signed)
Should pt restart the Aspirin?

## 2022-03-25 NOTE — Telephone Encounter (Signed)
He can resume his aspirin. He is on a low dose '81mg'$  aspirin.  The benefit of low-dose aspirin outweighs any risk associated with nephropathy or other adverse effects even in the setting of severe kidney impairment. No dosage adjustment necessary.

## 2022-03-25 NOTE — Telephone Encounter (Signed)
(  Also see his my chart message).  I do not believe the low dose aspirin affected the kidney function.  We are going to do some other tests to evaluate further.

## 2022-03-26 ENCOUNTER — Other Ambulatory Visit: Payer: Self-pay | Admitting: Internal Medicine

## 2022-03-26 DIAGNOSIS — R944 Abnormal results of kidney function studies: Secondary | ICD-10-CM

## 2022-03-26 NOTE — Progress Notes (Signed)
Order placed for renal ultrasound.

## 2022-04-01 ENCOUNTER — Ambulatory Visit
Admission: RE | Admit: 2022-04-01 | Discharge: 2022-04-01 | Disposition: A | Discharge status: Home / Self Care | Payer: PPO | Source: Ambulatory Visit | Attending: Internal Medicine | Admitting: Internal Medicine

## 2022-04-01 DIAGNOSIS — R944 Abnormal results of kidney function studies: Secondary | ICD-10-CM | POA: Insufficient documentation

## 2022-04-03 ENCOUNTER — Other Ambulatory Visit (INDEPENDENT_AMBULATORY_CARE_PROVIDER_SITE_OTHER): Payer: PPO

## 2022-04-03 DIAGNOSIS — L84 Corns and callosities: Secondary | ICD-10-CM | POA: Diagnosis not present

## 2022-04-03 DIAGNOSIS — R944 Abnormal results of kidney function studies: Secondary | ICD-10-CM

## 2022-04-03 DIAGNOSIS — L821 Other seborrheic keratosis: Secondary | ICD-10-CM | POA: Diagnosis not present

## 2022-04-03 DIAGNOSIS — L218 Other seborrheic dermatitis: Secondary | ICD-10-CM | POA: Diagnosis not present

## 2022-04-03 DIAGNOSIS — R202 Paresthesia of skin: Secondary | ICD-10-CM | POA: Diagnosis not present

## 2022-04-03 LAB — BASIC METABOLIC PANEL
BUN: 16 mg/dL (ref 6–23)
CO2: 29 mEq/L (ref 19–32)
Calcium: 9.4 mg/dL (ref 8.4–10.5)
Chloride: 104 mEq/L (ref 96–112)
Creatinine, Ser: 1.31 mg/dL (ref 0.40–1.50)
GFR: 55.06 mL/min — ABNORMAL LOW (ref >60.00)
Glucose, Bld: 101 mg/dL — ABNORMAL HIGH (ref 70–99)
Potassium: 4.6 mEq/L (ref 3.5–5.1)
Sodium: 141 mEq/L (ref 135–145)

## 2022-04-03 NOTE — Telephone Encounter (Signed)
Order placed for f/u met b 

## 2022-04-09 ENCOUNTER — Other Ambulatory Visit: Payer: Self-pay | Admitting: Internal Medicine

## 2022-04-09 DIAGNOSIS — E7800 Pure hypercholesterolemia, unspecified: Secondary | ICD-10-CM

## 2022-04-10 ENCOUNTER — Ambulatory Visit (INDEPENDENT_AMBULATORY_CARE_PROVIDER_SITE_OTHER): Payer: PPO | Admitting: Internal Medicine

## 2022-04-10 ENCOUNTER — Encounter: Payer: Self-pay | Admitting: Internal Medicine

## 2022-04-10 VITALS — BP 110/62 | HR 70 | Temp 97.7°F | Ht 68.0 in | Wt 163.4 lb

## 2022-04-10 DIAGNOSIS — M25511 Pain in right shoulder: Secondary | ICD-10-CM

## 2022-04-10 DIAGNOSIS — T466X5A Adverse effect of antihyperlipidemic and antiarteriosclerotic drugs, initial encounter: Secondary | ICD-10-CM | POA: Diagnosis not present

## 2022-04-10 DIAGNOSIS — R931 Abnormal findings on diagnostic imaging of heart and coronary circulation: Secondary | ICD-10-CM | POA: Diagnosis not present

## 2022-04-10 DIAGNOSIS — R944 Abnormal results of kidney function studies: Secondary | ICD-10-CM

## 2022-04-10 DIAGNOSIS — E7800 Pure hypercholesterolemia, unspecified: Secondary | ICD-10-CM | POA: Diagnosis not present

## 2022-04-10 DIAGNOSIS — M25512 Pain in left shoulder: Secondary | ICD-10-CM

## 2022-04-10 DIAGNOSIS — M791 Myalgia, unspecified site: Secondary | ICD-10-CM

## 2022-04-10 DIAGNOSIS — R739 Hyperglycemia, unspecified: Secondary | ICD-10-CM | POA: Diagnosis not present

## 2022-04-10 DIAGNOSIS — Z86010 Personal history of colonic polyps: Secondary | ICD-10-CM

## 2022-04-10 NOTE — Progress Notes (Unsigned)
Patient ID: Brian Zeitlin, male   DOB: [birth year], 71 y.o.   MRN: [id number]   Subjective:    Patient ID: Badr Piedra, male    DOB: 1951-04-09, 71 y.o.   MRN: [id number]  This visit occurred during the SARS-CoV-2 public health emergency.  Safety protocols were in place, including screening questions prior to the visit, additional usage of staff PPE, and extensive cleaning of exam room while observing appropriate contact time as indicated for disinfecting solutions.   Patient here for  Chief Complaint  Patient presents with   Follow-up    Follow up   .   HPI    Past Medical History:  Diagnosis Date   Blood in stool    H/O   History of chicken pox    Hyperlipidemia    PONV (postoperative nausea and vomiting)    Past Surgical History:  Procedure Laterality Date   CHOLECYSTECTOMY  1998   COLONOSCOPY     SHOULDER ARTHROSCOPY WITH BANKART REPAIR Left 09/14/2017   Procedure: SHOULDER ARTHROSCOPY WITH MINI OPEN ROTATOR CUFF REPAIR, SUBACROMINAL DECOMPRESSION, LABRAL REPAIR,BICEP TENOTOMY, DISTAL CLAVICLE EXCISION ;  Surgeon: Thornton Park, MD;  Location: [facility] ORS;  Service: Orthopedics;  Laterality: Left;   SHOULDER ARTHROSCOPY WITH OPEN ROTATOR CUFF REPAIR AND DISTAL CLAVICLE ACROMINECTOMY Right 01/16/2021   Procedure: RIGHT SHOULDER ARTHROSCOPY WITH MINI-OPEN ROTATOR CUFF REPAIR, SUBACCROMINAL DECOMPRESSION AND DISTAL CLAVICLE EXCISION;  Surgeon: Thornton Park, MD;  Location: [facility] ORS;  Service: Orthopedics;  Laterality: Right;   Family History  Problem Relation Age of Onset   Dementia Mother    Diabetes Father    Colitis Sister    Autoimmune disease Brother    Colitis Daughter    Social History   Socioeconomic History   Marital status: Married    Spouse name: Not on file   Number of children: Not on file   Years of education: Not on file   Highest education level: Not on file  Occupational History   Not on file  Tobacco Use   Smoking status: Never   Smokeless  tobacco: Never  Vaping Use   Vaping Use: Never used  Substance and Sexual Activity   Alcohol use: No    Alcohol/week: 0.0 standard drinks of alcohol    Comment: occ   Drug use: No   Sexual activity: Yes  Other Topics Concern   Not on file  Social History Narrative   Not on file   Social Determinants of Health   Financial Resource Strain: Low Risk  (05/26/2021)   Overall Financial Resource Strain (CARDIA)    Difficulty of Paying Living Expenses: Not hard at all  Food Insecurity: No Food Insecurity (05/26/2021)   Hunger Vital Sign    Worried About Running Out of Food in the Last Year: Never true    [place] in the Last Year: Never true  Transportation Needs: No Transportation Needs (05/26/2021)   PRAPARE - Hydrologist (Medical): No    Lack of Transportation (Non-Medical): No  Physical Activity: Sufficiently Active (05/26/2021)   Exercise Vital Sign    Days of Exercise per Week: 4 days    Minutes of Exercise per Session: 150+ min  Stress: No Stress Concern Present (05/26/2021)   [place]    Feeling of Stress : Not at all  Social Connections: Unknown (05/26/2021)   Social Connection and Isolation Panel [NHANES]    Frequency of Communication with Friends  and Family: Not on file    Frequency of Social Gatherings with Friends and Family: Not on file    Attends Religious Services: Not on file    Active Member of Clubs or Organizations: Not on file    Attends Archivist Meetings: Not on file    Marital Status: Married     Review of Systems     Objective:     BP 110/62 (BP Location: Left Arm, Patient Position: Sitting, Cuff Size: Normal)   Pulse 70   Temp 97.7 F (36.5 C) (Oral)   Ht '5\' 8"'$  (1.727 m)   Wt 163 lb 6.4 oz (74.1 kg)   SpO2 96%   BMI 24.84 kg/m  Wt Readings from Last 3 Encounters:  04/10/22 163 lb 6.4 oz (74.1 kg)  01/29/22 165 lb 3.2 oz  (74.9 kg)  01/06/22 168 lb 6.4 oz (76.4 kg)    Physical Exam   Outpatient Encounter Medications as of 04/10/2022  Medication Sig   MAGNESIUM PO Take by mouth.   Menaquinone-7 (VITAMIN K2 PO) Take 200 mg by mouth.   Multiple Vitamins-Minerals (VITAMIN D3 COMPLETE PO) Take by mouth.   Multiple Vitamins-Minerals (ZINC PO) Take by mouth.   Omega-3 Fatty Acids (OMEGA 3 PO) Take by mouth.   REPATHA SURECLICK 062 MG/ML SOAJ INJECT 140 MG INTO THE SKIN EVERY 14 (FOURTEEN) DAYS.   aspirin EC 81 MG tablet Take 1 tablet (81 mg total) by mouth daily. Swallow whole. (Patient not taking: Reported on 04/10/2022)   No facility-administered encounter medications on file as of 04/10/2022.     Lab Results  Component Value Date   WBC 4.7 11/21/2021   HGB 15.3 11/21/2021   HCT 46.3 11/21/2021   PLT 189.0 11/21/2021   GLUCOSE 101 (H) 04/03/2022   CHOL 138 03/24/2022   TRIG 107.0 03/24/2022   HDL 56.20 03/24/2022   LDLCALC 61 03/24/2022   ALT 18 03/24/2022   AST 19 03/24/2022   NA 141 04/03/2022   K 4.6 04/03/2022   CL 104 04/03/2022   CREATININE 1.31 04/03/2022   BUN 16 04/03/2022   CO2 29 04/03/2022   TSH 2.94 11/21/2021   PSA 1.78 06/02/2021   INR 1.0 08/30/2017   HGBA1C 6.1 03/24/2022   MICROALBUR <0.7 07/20/2016    US Renal  Result Date: 04/02/2022 CLINICAL DATA:  Decreased GFR. EXAM: RENAL / URINARY TRACT ULTRASOUND COMPLETE COMPARISON:  None Available. FINDINGS: Right Kidney: Renal measurements: 10.9 x 4.7 x 5.1 cm = volume: 137 mL. Echogenicity within normal limits. No mass or hydronephrosis visualized. Left Kidney: Renal measurements: 11.0 x 5.1 x 4.4 cm = volume: 130 mL. Echogenicity within normal limits. No mass or hydronephrosis visualized. Bladder: Appears normal for degree of bladder distention. Other: None. IMPRESSION: Normal study.  No cause for decreasing GFR identified. Electronically Signed   By: Dorise Bullion III M.D.   On: 04/02/2022 11:39       Assessment & Plan:    Problem List Items Addressed This Visit   None    [name], MD

## 2022-04-13 ENCOUNTER — Encounter: Payer: Self-pay | Admitting: Internal Medicine

## 2022-04-13 NOTE — Assessment & Plan Note (Signed)
Has adjusted diet.  Feels better.  Follow met b and a1c.   Lab Results  Component Value Date   HGBA1C 6.1 03/24/2022   

## 2022-04-13 NOTE — Assessment & Plan Note (Signed)
Have discussed results.  Previously started on aspirin daily.  Off now.  Kidney function (GFR) decreased after starting. Discussed aspirin therapy.  Will hold on restarting at this time.  Monitor kidney function.  Discussed trial in the future.  Continue repatha.

## 2022-04-13 NOTE — Assessment & Plan Note (Signed)
Colonoscopy 10/2013.  Recommended f/u in 10 years.

## 2022-04-13 NOTE — Assessment & Plan Note (Signed)
Continue to stay hydrated.  Avoid anti-inflammatories.  Has adjusted diet.  Feels good. Follow metabolic panel.

## 2022-04-13 NOTE — Assessment & Plan Note (Signed)
S/p shoulder.  Improved.

## 2022-04-13 NOTE — Assessment & Plan Note (Signed)
Intolerant to statins.  On repatha. Doing well.

## 2022-04-13 NOTE — Assessment & Plan Note (Signed)
On repatha.  Monitors diet.  Cholesterol much improved.  Follow lipid panel.   Lab Results  Component Value Date   CHOL 138 03/24/2022   HDL 56.20 03/24/2022   LDLCALC 61 03/24/2022   TRIG 107.0 03/24/2022   CHOLHDL 2 03/24/2022

## 2022-04-15 ENCOUNTER — Telehealth: Payer: Self-pay | Admitting: Internal Medicine

## 2022-04-15 NOTE — Telephone Encounter (Signed)
Patient called his pharmacy to have his [place] 883 MG/ML SOAJ refilled. It was going to cost him $500. Patient called insurance and he is in the donut whole. Is there any way to get medication cheaper?

## 2022-04-16 NOTE — Telephone Encounter (Signed)
[place] for referral and if needs a sample - ok.

## 2022-04-17 ENCOUNTER — Other Ambulatory Visit: Payer: Self-pay

## 2022-04-17 DIAGNOSIS — E7800 Pure hypercholesterolemia, unspecified: Secondary | ICD-10-CM

## 2022-04-17 NOTE — Telephone Encounter (Signed)
Samples given today & patient knows to expect call in regards to referral.

## 2022-04-17 NOTE — Telephone Encounter (Signed)
L[facility] referral [id number] has been placed for patient.

## 2022-04-17 NOTE — Telephone Encounter (Signed)
Patient returned office phone call, note was read. Patient is on his way to pick up samples at office.

## 2022-04-20 ENCOUNTER — Telehealth: Payer: Self-pay | Admitting: Pharmacist

## 2022-04-20 NOTE — Progress Notes (Signed)
[facility])                                            [place] Team    04/20/2022  Elijah Walker 18-Mar-1951 [id number]   Left pt a HIPPA compliant message regarding his request for assistance with Repatha.  Will try to contact him at a later date.  Curlene Labrum, PharmD [place] Pharmacist Office: [phone]

## 2022-04-23 ENCOUNTER — Telehealth: Payer: Self-pay | Admitting: Pharmacist

## 2022-04-23 NOTE — Progress Notes (Signed)
[facility])                                            [place] Team    04/23/2022  Jmichael Gille [birth year] [id number]  Second attempt to reach patient was not successful.  Left a voicemail and will try to call him again at a later date.  Curlene Labrum, PharmD [place] Pharmacist Office: [phone]

## 2022-04-27 ENCOUNTER — Telehealth: Payer: Self-pay | Admitting: Pharmacist

## 2022-04-27 NOTE — Progress Notes (Signed)
[facility])  [place] Team   Reason for referral: [place] pharmacy referral is being closed due to the following reasons:  I spoke with Mr. Tribbett today and discussed options for assistance with Repatha. We have put him on the wait list for  the [employer] and [employer].  He medication through November.  He has my contact information if he needs additional assistance.  [place] is happy to assist the patient/family in the future for clinical pharmacy needs, following a discussion from your team about [place] outreach. Thank you for allowing [facility] to be a part of your patient's care.   Curlene Labrum, PharmD [place] Pharmacist Office: [phone]

## 2022-05-25 ENCOUNTER — Telehealth: Payer: Self-pay | Admitting: Internal Medicine

## 2022-05-25 NOTE — Telephone Encounter (Signed)
Copied from [place] [id number]. Topic: Medicare AWV >> May 25, 2022  9:17 AM Devoria Glassing wrote: Reason for CRM: Left message for patient to schedule Annual Wellness Visit.  Please schedule with Nurse Health Advisor Denisa O'Brien-Blaney, LPN at [facility]. This appt can be telephone or office visit.  Please call [phone] ask for [facility]

## 2022-06-05 ENCOUNTER — Encounter: Payer: Self-pay | Admitting: Internal Medicine

## 2022-06-08 ENCOUNTER — Telehealth: Payer: Self-pay

## 2022-06-08 DIAGNOSIS — H2513 Age-related nuclear cataract, bilateral: Secondary | ICD-10-CM | POA: Diagnosis not present

## 2022-06-08 NOTE — Telephone Encounter (Signed)
Samples of Repatha were given to the patient, quantity 2, Lot Number [id number]

## 2022-06-28 ENCOUNTER — Encounter: Payer: Self-pay | Admitting: Internal Medicine

## 2022-06-30 ENCOUNTER — Ambulatory Visit (INDEPENDENT_AMBULATORY_CARE_PROVIDER_SITE_OTHER): Payer: PPO

## 2022-06-30 VITALS — BP 116/60 | HR 55 | Ht 68.0 in | Wt 160.0 lb

## 2022-06-30 DIAGNOSIS — Z0000 Encounter for general adult medical examination without abnormal findings: Secondary | ICD-10-CM | POA: Diagnosis not present

## 2022-06-30 NOTE — Progress Notes (Signed)
Subjective:   Elijah Walker is a 71 y.o. male who presents for Medicare Annual/Subsequent preventive examination.  Review of Systems    No ROS.  Medicare Wellness Virtual Visit.  Visual/audio telehealth visit, UTA vital signs.   See social history for additional risk factors.   Cardiac Risk Factors include: advanced age (>7mn, >>68women);male gender     Objective:    Today's Vitals   06/30/22 0907  BP: 116/60  Pulse: (!) 55  SpO2: 98%  Weight: 160 lb (72.6 kg)  Height: '5\' 8"'$  (1.727 m)   Body mass index is 24.33 kg/m.     06/30/2022    9:13 AM 05/26/2021    8:36 AM 01/16/2021   11:03 AM 01/06/2021    9:50 AM 05/23/2020    8:46 AM 05/23/2019    8:44 AM 05/17/2018    9:33 AM  Advanced Directives  Does Patient Have a Medical Advance Directive? No Yes No No Yes No No  Type of ACorporate treasurerof AMolenaLiving will   HMerrillLiving will    Does patient want to make changes to medical advance directive?  No - Patient declined   No - Patient declined    Copy of [place] Chart?  No - copy requested   No - copy requested    Would patient like information on creating a medical advance directive? No - Patient declined  No - Patient declined No - Patient declined  No - Patient declined No - Patient declined    Current Medications (verified) Outpatient Encounter Medications as of 06/30/2022  Medication Sig   Multiple Vitamins-Minerals (VITAMIN D3 COMPLETE PO) Take by mouth.   Omega-3 Fatty Acids (OMEGA 3 PO) Take by mouth.   REPATHA SURECLICK 1814MG/ML SOAJ INJECT 140 MG INTO THE SKIN EVERY 14 (FOURTEEN) DAYS.   MAGNESIUM PO Take by mouth. (Patient not taking: Reported on 06/30/2022)   Menaquinone-7 (VITAMIN K2 PO) Take 200 mg by mouth. (Patient not taking: Reported on 06/30/2022)   No facility-administered encounter medications on file as of 06/30/2022.    Allergies (verified) Patient has no known allergies.    History: Past Medical History:  Diagnosis Date   Blood in stool    H/O   History of chicken pox    Hyperlipidemia    PONV (postoperative nausea and vomiting)    Past Surgical History:  Procedure Laterality Date   CHOLECYSTECTOMY  1998   COLONOSCOPY     SHOULDER ARTHROSCOPY WITH BANKART REPAIR Left 09/14/2017   Procedure: SHOULDER ARTHROSCOPY WITH MINI OPEN ROTATOR CUFF REPAIR, SUBACROMINAL DECOMPRESSION, LABRAL REPAIR,BICEP TENOTOMY, DISTAL CLAVICLE EXCISION ;  Surgeon: KThornton Park MD;  Location: [facility] ORS;  Service: Orthopedics;  Laterality: Left;   SHOULDER ARTHROSCOPY WITH OPEN ROTATOR CUFF REPAIR AND DISTAL CLAVICLE ACROMINECTOMY Right 01/16/2021   Procedure: RIGHT SHOULDER ARTHROSCOPY WITH MINI-OPEN ROTATOR CUFF REPAIR, SUBACCROMINAL DECOMPRESSION AND DISTAL CLAVICLE EXCISION;  Surgeon: KThornton Park MD;  Location: [facility] ORS;  Service: Orthopedics;  Laterality: Right;   Family History  Problem Relation Age of Onset   Dementia Mother    Diabetes Father    Colitis Sister    Autoimmune disease Brother    Colitis Daughter    Social History   Socioeconomic History   Marital status: Married    Spouse name: Not on file   Number of children: Not on file   Years of education: Not on file   Highest education level: Not on file  Occupational History   Not on file  Tobacco Use   Smoking status: Never   Smokeless tobacco: Never  Vaping Use   Vaping Use: Never used  Substance and Sexual Activity   Alcohol use: No    Alcohol/week: 0.0 standard drinks of alcohol    Comment: occ   Drug use: No   Sexual activity: Yes  Other Topics Concern   Not on file  Social History Narrative   Not on file   Social Determinants of Health   Financial Resource Strain: Low Risk  (06/30/2022)   Overall Financial Resource Strain (CARDIA)    Difficulty of Paying Living Expenses: Not hard at all  Food Insecurity: No Food Insecurity (06/30/2022)   Hunger Vital Sign    Worried About  Running Out of Food in the Last Year: Never true    Ran Out of Food in the Last Year: Never true  Transportation Needs: No Transportation Needs (06/30/2022)   PRAPARE - Hydrologist (Medical): No    Lack of Transportation (Non-Medical): No  Physical Activity: Insufficiently Active (06/30/2022)   Exercise Vital Sign    Days of Exercise per Week: 3 days    Minutes of Exercise per Session: 30 min  Stress: No Stress Concern Present (06/30/2022)   [place]    Feeling of Stress : Not at all  Social Connections: Unknown (06/30/2022)   Social Connection and Isolation Panel [NHANES]    Frequency of Communication with Friends and Family: Not on file    Frequency of Social Gatherings with Friends and Family: Not on file    Attends Religious Services: Not on file    Active Member of Clubs or Organizations: Not on file    Attends Archivist Meetings: Not on file    Marital Status: Married    Tobacco Counseling Counseling given: Not Answered   Clinical Intake:  Pre-visit preparation completed: Yes        Diabetes: No  How often do you need to have someone help you when you read instructions, pamphlets, or other written materials from your doctor or pharmacy?: 1 - Never   Interpreter Needed?: No      Activities of Daily Living    06/30/2022    8:57 AM  In your present state of health, do you have any difficulty performing the following activities:  Hearing? 0  Vision? 0  Difficulty concentrating or making decisions? 0  Walking or climbing stairs? 0  Dressing or bathing? 0  Doing errands, shopping? 0  Preparing Food and eating ? N  Using the Toilet? N  In the past six months, have you accidently leaked urine? N  Do you have problems with loss of bowel control? N  Managing your Medications? N  Managing your Finances? N  Housekeeping or managing your Housekeeping? N     Patient Care Team: [name], MD as PCP - General (Internal Medicine) Kate Sable, MD as PCP - Cardiology (Cardiology)  Indicate any recent Medical Services you may have received from other than [facility] providers in the past year (date may be approximate).     Assessment:   This is a routine wellness examination for Elijah Walker.  I connected with  Jonael Paradiso on 06/30/22 by a audio enabled telemedicine application and verified that I am speaking with the correct person using two identifiers.  Patient Location: Home  Provider Location: Office/Clinic  I discussed the limitations of  evaluation and management by telemedicine. The patient expressed understanding and agreed to proceed.   Hearing/Vision screen Hearing Screening - Comments:: Patient is able to hear conversational tones without difficulty. No issues reported. Vision Screening - Comments:: Followed by [facility] Wears corrective lenses They have regular follow up with the ophthalmologist    Dietary issues and exercise activities discussed: Current Exercise Habits: Home exercise routine, Type of exercise: treadmill, Time (Minutes): 30, Frequency (Times/Week): 3, Weekly Exercise (Minutes/Week): 90, Intensity: Mild Vegan diet with some exceptions; mostly cabbage, rice, vegetables, black beans. 40-'60mg'$  pea powder protein, mixed with blueberry and cranberry. 8-12 cups of water intake, daily. 3-4 cups decaf coffee with half water/half coffee,daily.   Goals Addressed               This Visit's Progress     Patient Stated     Increase physical activity (pt-stated)        Weight lift 10 minutes about 2 times daily, as  tolerated Use the treadmill up to 4 days       Depression Screen    06/30/2022    8:59 AM 04/10/2022    2:36 PM 01/06/2022   11:22 AM 12/23/2021   11:05 AM 12/02/2021    9:35 AM 05/26/2021    8:35 AM 05/23/2020    8:49 AM  PHQ 2/9 Scores  PHQ - 2 Score 0 0 0 0 0 0 0    Fall  Risk    06/30/2022    8:59 AM 04/10/2022    2:36 PM 01/06/2022   11:22 AM 12/23/2021   11:05 AM 12/02/2021    9:34 AM  [place] in the past year? 0 0 0 0 0  Number falls in past yr: 0   0   Injury with Fall? 0   0   Risk for fall due to : No Fall Risks No Fall Risks No Fall Risks No Fall Risks No Fall Risks  Follow up Falls evaluation completed Falls evaluation completed Falls evaluation completed Falls evaluation completed Falls evaluation completed    FALL RISK PREVENTION PERTAINING TO THE HOME: Home free of loose throw rugs in walkways, pet beds, electrical cords, etc? Yes  Adequate lighting in your home to reduce risk of falls? Yes   ASSISTIVE DEVICES UTILIZED TO PREVENT FALLS: Life alert? No  Use of a cane, walker or w/c? No  Grab bars in the bathroom? Yes  Shower chair or bench in shower? Yes  Elevated toilet seat or a handicapped toilet? No   TIMED UP AND GO: Was the test performed? No .   Cognitive Function: Patient is alert and oriented x3.  Denies any new difficulties with making decisions, memory loss, and concentrating.  Manages his own medications and finances.  100% independent.      05/17/2018    9:52 AM  MMSE - Mini Mental State Exam  Orientation to time 5  Orientation to Place 5  Registration 3  Attention/ Calculation 5  Recall 3  Language- name 2 objects 2  Language- repeat 1  Language- follow 3 step command 3  Language- read & follow direction 1  Write a sentence 1  Copy design 1  Total score 30        06/30/2022    9:25 AM 05/26/2021    8:42 AM 05/23/2019    8:58 AM  6CIT Screen  What Year? 0 points  0 points  What month? 0 points  0 points  What time? 0 points  0 points  Count back from 20 0 points  0 points  Months in reverse 0 points 0 points 0 points  Repeat phrase 0 points  0 points  Total Score 0 points  0 points    Immunizations Immunization History  Administered Date(s) Administered   Fluad Quad(high Dose 65+)  03/21/2019, 04/12/2022   Influenza Whole 06/22/2016   Influenza, High Dose Seasonal PF 04/08/2020   Influenza,inj,Quad PF,6+ Mos 05/08/2014, 05/22/2015   Influenza-Unspecified 04/05/2013, 03/18/2017, 04/26/2018, 04/21/2021   PFIZER(Purple Top)SARS-COV-2 Vaccination 08/27/2019, 09/20/2019, 05/09/2020   PPD Test 08/29/2014   Pfizer Covid-19 Vaccine Bivalent Booster 39yr & up 03/21/2021, 04/18/2022   Pneumococcal Conjugate-13 09/01/2016   Pneumococcal Polysaccharide-23 03/24/2018   Tdap 07/04/2008   Zoster, Live 01/23/2011   TDAP status: Due, Education has been provided regarding the importance of this vaccine. Advised may receive this vaccine at local pharmacy or Health Dept. Aware to provide a copy of the vaccination record if obtained from local pharmacy or Health Dept. Verbalized acceptance and understanding.   Shingrix Completed?: No.    Education has been provided regarding the importance of this vaccine. Patient has been advised to call insurance company to determine out of pocket expense if they have not yet received this vaccine. Advised may also receive vaccine at local pharmacy or Health Dept. Verbalized acceptance and understanding.  Screening Tests Health Maintenance  Topic Date Due   DTaP/Tdap/Td (2 - Td or Tdap) 07/04/2018   COVID-19 Vaccine (6 - 2023-24 season) 07/16/2022 (Originally 06/13/2022)   Zoster Vaccines- Shingrix (1 of 2) 09/29/2022 (Originally 07/07/2001)   [place] ([place]  07/01/2023   COLONOSCOPY (Pts 45-443yrInsurance coverage will need to be confirmed)  11/01/2023   Pneumonia Vaccine 6533Years old  Completed   INFLUENZA VACCINE  Completed   Hepatitis C Screening  Completed   HPV VACCINES  Aged Out   Health Maintenance Health Maintenance Due  Topic Date Due   DTaP/Tdap/Td (2 - Td or Tdap) 07/04/2018   Lung Cancer Screening: (Low Dose CT Chest recommended if Age 71-80ears, 30 pack-year currently smoking OR have quit w/in 15years.) does  not qualify.   Hepatitis C Screening: Completed 2019.  Vision Screening: Recommended annual ophthalmology exams for early detection of glaucoma and other disorders of the eye.  Dental Screening: Recommended annual dental exams for proper oral hygiene  Community Resource Referral / Chronic Care Management: CRR required this visit?  No   CCM required this visit?  No      Plan:     I have personally reviewed and noted the following in the patient's chart:   Medical and social history Use of alcohol, tobacco or illicit drugs  Current medications and supplements including opioid prescriptions. Patient is not currently taking opioid prescriptions. Functional ability and status Nutritional status Physical activity Advanced directives List of other physicians Hospitalizations, surgeries, and ER visits in previous 12 months Vitals Screenings to include cognitive, depression, and falls Referrals and appointments  In addition, I have reviewed and discussed with patient certain preventive protocols, quality metrics, and best practice recommendations. A written personalized care plan for preventive services as well as general preventive health recommendations were provided to patient.     DeLeta JunglingLPN   [phone]

## 2022-06-30 NOTE — Patient Instructions (Addendum)
Elijah Walker , Thank you for taking time to come for your Medicare Wellness Visit. I appreciate your ongoing commitment to your health goals. Please review the following plan we discussed and let me know if I can assist you in the future.   These are the goals we discussed:  Goals       Patient Stated     Increase physical activity (pt-stated)      Weight lift 10 minutes about 2 times daily, as  tolerated Use the treadmill up to 4 days      Low cholesterol/carb diet (pt-stated)      Stay active        This is a list of the screening recommended for you and due dates:  Health Maintenance  Topic Date Due   DTaP/Tdap/Td vaccine (2 - Td or Tdap) 07/04/2018   COVID-19 Vaccine (6 - 2023-24 season) 07/16/2022*   Zoster (Shingles) Vaccine (1 of 2) 09/29/2022*   Medicare Annual Wellness Visit  07/01/2023   Colon Cancer Screening  11/01/2023   Pneumonia Vaccine  Completed   Flu Shot  Completed   Hepatitis C Screening: USPSTF Recommendation to screen - Ages 18-79 yo.  Completed   HPV Vaccine  Aged Out  *Topic was postponed. The date shown is not the original due date.    Go to local pharmacy and complete shingrix vaccine and tdap vaccine.  End of life planning; Advance aging; Advanced directives discussed.  Unsure if HCPOA/Living Will is completed. Copy requested to put on file once confirmed or completed.  Additional information available in office as needed.  Conditions/risks identified: none new.  Next appointment: Follow up in one year for your annual wellness visit.   Preventive Care 61 Years and Older, Male  Preventive care refers to lifestyle choices and visits with your health care provider that can promote health and wellness. What does preventive care include? A yearly physical exam. This is also called an annual well check. Dental exams once or twice a year. Routine eye exams. Ask your health care provider how often you should have your eyes checked. Personal lifestyle  choices, including: Daily care of your teeth and gums. Regular physical activity. Eating a healthy diet. Avoiding tobacco and drug use. Limiting alcohol use. Practicing safe sex. Taking low doses of aspirin every day. Taking vitamin and mineral supplements as recommended by your health care provider. What happens during an annual well check? The services and screenings done by your health care provider during your annual well check will depend on your age, overall health, lifestyle risk factors, and family history of disease. Counseling  Your health care provider may ask you questions about your: Alcohol use. Tobacco use. Drug use. Emotional well-being. Home and relationship well-being. Sexual activity. Eating habits. History of falls. Memory and ability to understand (cognition). Work and work Statistician. Screening  You may have the following tests or measurements: Height, weight, and BMI. Blood pressure. Lipid and cholesterol levels. These may be checked every 5 years, or more frequently if you are over 33 years old. Skin check. Lung cancer screening. You may have this screening every year starting at age 17 if you have a 30-pack-year history of smoking and currently smoke or have quit within the past 15 years. Fecal occult blood test (FOBT) of the stool. You may have this test every year starting at age 62. Flexible sigmoidoscopy or colonoscopy. You may have a sigmoidoscopy every 5 years or a colonoscopy every 10 years starting at age  50. Prostate cancer screening. Recommendations will vary depending on your family history and other risks. Hepatitis C blood test. Hepatitis B blood test. Sexually transmitted disease (STD) testing. Diabetes screening. This is done by checking your blood sugar (glucose) after you have not eaten for a while (fasting). You may have this done every 1-3 years. Abdominal aortic aneurysm (AAA) screening. You may need this if you are a current or  former smoker. Osteoporosis. You may be screened starting at age 35 if you are at high risk. Talk with your health care provider about your test results, treatment options, and if necessary, the need for more tests. Vaccines  Your health care provider may recommend certain vaccines, such as: Influenza vaccine. This is recommended every year. Tetanus, diphtheria, and acellular pertussis (Tdap, Td) vaccine. You may need a Td booster every 10 years. Zoster vaccine. You may need this after age 72. Pneumococcal 13-valent conjugate (PCV13) vaccine. One dose is recommended after age 48. Pneumococcal polysaccharide (PPSV23) vaccine. One dose is recommended after age 52. Talk to your health care provider about which screenings and vaccines you need and how often you need them. This information is not intended to replace advice given to you by your health care provider. Make sure you discuss any questions you have with your health care provider. Document Released: 07/26/2015 Document Revised: 03/18/2016 Document Reviewed: 04/30/2015 Elsevier Interactive Patient Education  2017 [place] Prevention in the Home Falls can cause injuries. They can happen to people of all ages. There are many things you can do to make your home safe and to help prevent falls. What can I do on the outside of my home? Regularly fix the edges of walkways and driveways and fix any cracks. Remove anything that might make you trip as you walk through a door, such as a raised step or threshold. Trim any bushes or trees on the path to your home. Use bright outdoor lighting. Clear any walking paths of anything that might make someone trip, such as rocks or tools. Regularly check to see if handrails are loose or broken. Make sure that both sides of any steps have handrails. Any raised decks and porches should have guardrails on the edges. Have any leaves, snow, or ice cleared regularly. Use sand or salt on walking paths  during winter. Clean up any spills in your garage right away. This includes oil or grease spills. What can I do in the bathroom? Use night lights. Install grab bars by the toilet and in the tub and shower. Do not use towel bars as grab bars. Use non-skid mats or decals in the tub or shower. If you need to sit down in the shower, use a plastic, non-slip stool. Keep the floor dry. Clean up any water that spills on the floor as soon as it happens. Remove soap buildup in the tub or shower regularly. Attach bath mats securely with double-sided non-slip rug tape. Do not have throw rugs and other things on the floor that can make you trip. What can I do in the bedroom? Use night lights. Make sure that you have a light by your bed that is easy to reach. Do not use any sheets or blankets that are too big for your bed. They should not hang down onto the floor. Have a firm chair that has side arms. You can use this for support while you get dressed. Do not have throw rugs and other things on the floor that can make you  trip. What can I do in the kitchen? Clean up any spills right away. Avoid walking on wet floors. Keep items that you use a lot in easy-to-reach places. If you need to reach something above you, use a strong step stool that has a grab bar. Keep electrical cords out of the way. Do not use floor polish or wax that makes floors slippery. If you must use wax, use non-skid floor wax. Do not have throw rugs and other things on the floor that can make you trip. What can I do with my stairs? Do not leave any items on the stairs. Make sure that there are handrails on both sides of the stairs and use them. Fix handrails that are broken or loose. Make sure that handrails are as long as the stairways. Check any carpeting to make sure that it is firmly attached to the stairs. Fix any carpet that is loose or worn. Avoid having throw rugs at the top or bottom of the stairs. If you do have throw  rugs, attach them to the floor with carpet tape. Make sure that you have a light switch at the top of the stairs and the bottom of the stairs. If you do not have them, ask someone to add them for you. What else can I do to help prevent falls? Wear shoes that: Do not have high heels. Have rubber bottoms. Are comfortable and fit you well. Are closed at the toe. Do not wear sandals. If you use a stepladder: Make sure that it is fully opened. Do not climb a closed stepladder. Make sure that both sides of the stepladder are locked into place. Ask someone to hold it for you, if possible. Clearly mark and make sure that you can see: Any grab bars or handrails. First and last steps. Where the edge of each step is. Use tools that help you move around (mobility aids) if they are needed. These include: Canes. Walkers. Scooters. Crutches. Turn on the lights when you go into a dark area. Replace any light bulbs as soon as they burn out. Set up your furniture so you have a clear path. Avoid moving your furniture around. If any of your floors are uneven, fix them. If there are any pets around you, be aware of where they are. Review your medicines with your doctor. Some medicines can make you feel dizzy. This can increase your chance of falling. Ask your doctor what other things that you can do to help prevent falls. This information is not intended to replace advice given to you by your health care provider. Make sure you discuss any questions you have with your health care provider. Document Released: 04/25/2009 Document Revised: 12/05/2015 Document Reviewed: 08/03/2014 Elsevier Interactive Patient Education  2017 [employer].

## 2022-07-01 ENCOUNTER — Other Ambulatory Visit (INDEPENDENT_AMBULATORY_CARE_PROVIDER_SITE_OTHER): Payer: PPO

## 2022-07-01 DIAGNOSIS — R944 Abnormal results of kidney function studies: Secondary | ICD-10-CM

## 2022-07-01 LAB — BASIC METABOLIC PANEL
BUN: 13 mg/dL (ref 6–23)
CO2: 31 mEq/L (ref 19–32)
Calcium: 9.6 mg/dL (ref 8.4–10.5)
Chloride: 103 mEq/L (ref 96–112)
Creatinine, Ser: 1.15 mg/dL (ref 0.40–1.50)
GFR: 64.27 mL/min (ref >60.00)
Glucose, Bld: 104 mg/dL — ABNORMAL HIGH (ref 70–99)
Potassium: 4.3 mEq/L (ref 3.5–5.1)
Sodium: 141 mEq/L (ref 135–145)

## 2022-07-23 ENCOUNTER — Encounter: Payer: Self-pay | Admitting: Internal Medicine

## 2022-07-24 ENCOUNTER — Encounter: Payer: Self-pay | Admitting: Cardiology

## 2022-07-24 ENCOUNTER — Ambulatory Visit: Payer: PPO | Attending: Cardiology | Admitting: Cardiology

## 2022-07-24 VITALS — BP 124/70 | HR 64 | Ht 68.0 in | Wt 162.0 lb

## 2022-07-24 DIAGNOSIS — I2510 Atherosclerotic heart disease of native coronary artery without angina pectoris: Secondary | ICD-10-CM | POA: Diagnosis not present

## 2022-07-24 DIAGNOSIS — E7800 Pure hypercholesterolemia, unspecified: Secondary | ICD-10-CM

## 2022-07-24 NOTE — Progress Notes (Signed)
Cardiology Office Note:    Date:  07/24/2022   ID:  Elijah Walker, DOB [birth year], MRN [id number]  PCP:  [name], MD   [facility] Providers Cardiologist:  Kate Sable, MD     Referring MD: [name], MD   Chief Complaint  Patient presents with   Follow-up    6 month f/u, no new cardiac concerns     History of Present Illness:    Elijah Walker is a 72 y.o. 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    Previously seen due to chest pain.  Coronary CTA showed nonobstructive CAD.  Echo showed normal systolic function EF 60 to 65%.  Patient started baby aspirin after coronary artery disease diagnosis, renal function decreased while on aspirin.  He stopped aspirin with resulting improvement in renal function.  Denies chest pain, he is very active.  Tolerating medications with no adverse effects.  Has no concerns at this time.  Prior notes/studies Echo 01/2021 EF 60 to 65% Coronary CTA 12/2021 calcium score 299, moderate proximal LCx, mild proximal LAD.  Past Medical History:  Diagnosis Date   Blood in stool    H/O   History of chicken pox    Hyperlipidemia    PONV (postoperative nausea and vomiting)     Past Surgical History:  Procedure Laterality Date   CHOLECYSTECTOMY  1998   COLONOSCOPY     SHOULDER ARTHROSCOPY WITH BANKART REPAIR Left 09/14/2017   Procedure: SHOULDER ARTHROSCOPY WITH MINI OPEN ROTATOR CUFF REPAIR, SUBACROMINAL DECOMPRESSION, LABRAL REPAIR,BICEP TENOTOMY, DISTAL CLAVICLE EXCISION ;  Surgeon: Thornton Park, MD;  Location: [facility] ORS;  Service: Orthopedics;  Laterality: Left;   SHOULDER ARTHROSCOPY WITH OPEN ROTATOR CUFF REPAIR AND DISTAL CLAVICLE ACROMINECTOMY Right 01/16/2021   Procedure: RIGHT SHOULDER ARTHROSCOPY WITH MINI-OPEN ROTATOR CUFF REPAIR, SUBACCROMINAL DECOMPRESSION AND DISTAL CLAVICLE EXCISION;  Surgeon: Thornton Park, MD;  Location:  [facility] ORS;  Service: Orthopedics;  Laterality: Right;    Current Medications: Current Meds  Medication Sig   calcium-vitamin D (OSCAL WITH D) 500-5 MG-MCG tablet Take 1 tablet by mouth daily with breakfast.   Omega-3 Fatty Acids (OMEGA 3 PO) Take 4,000 mg by mouth daily.   REPATHA SURECLICK 737 MG/ML SOAJ INJECT 140 MG INTO THE SKIN EVERY 14 (FOURTEEN) DAYS.     Allergies:   Patient has no known allergies.   Social History   Socioeconomic History   Marital status: Married    Spouse name: Not on file   Number of children: Not on file   Years of education: Not on file   Highest education level: Not on file  Occupational History   Not on file  Tobacco Use   Smoking status: Never   Smokeless tobacco: Never  Vaping Use   Vaping Use: Never used  Substance and Sexual Activity   Alcohol use: No    Alcohol/week: 0.0 standard drinks of alcohol    Comment: occ   Drug use: No   Sexual activity: Yes  Other Topics Concern   Not on file  Social History Narrative   Not on file   Social Determinants of Health   Financial Resource Strain: Low Risk  (06/30/2022)   Overall Financial Resource Strain (CARDIA)    Difficulty of Paying Living Expenses: Not hard at all  Food Insecurity: No Food Insecurity (06/30/2022)   Hunger Vital Sign    Worried About Running Out of Food in the Last Year: Never  true    Ran Out of Food in the Last Year: Never true  Transportation Needs: No Transportation Needs (06/30/2022)   PRAPARE - Hydrologist (Medical): No    Lack of Transportation (Non-Medical): No  Physical Activity: Insufficiently Active (06/30/2022)   Exercise Vital Sign    Days of Exercise per Week: 3 days    Minutes of Exercise per Session: 30 min  Stress: No Stress Concern Present (06/30/2022)   [place]    Feeling of Stress : Not at all  Social Connections: Unknown (06/30/2022)   Social  Connection and Isolation Panel [NHANES]    Frequency of Communication with Friends and Family: Not on file    Frequency of Social Gatherings with Friends and Family: Not on file    Attends Religious Services: Not on file    Active Member of Clubs or Organizations: Not on file    Attends Archivist Meetings: Not on file    Marital Status: Married     Family History: The patient's family history includes Autoimmune disease in his brother; Colitis in his daughter and sister; Dementia in his mother; Diabetes in his father.  ROS:   Please see the history of present illness.     All other systems reviewed and are negative.  EKGs/Labs/Other Studies Reviewed:    The following studies were reviewed today:   EKG:  EKG is  ordered today.  The ekg ordered today demonstrates sinus bradycardia, otherwise normal, heart rate 56  Recent Labs: 11/21/2021: Hemoglobin 15.3; Platelets 189.0; TSH 2.94 12/23/2021: B Natriuretic Peptide 11.4 03/24/2022: ALT 18 07/01/2022: BUN 13; Creatinine, Ser 1.15; Potassium 4.3; Sodium 141  Recent Lipid Panel    Component Value Date/Time   CHOL 138 03/24/2022 0932   TRIG 107.0 03/24/2022 0932   HDL 56.20 03/24/2022 0932   CHOLHDL 2 03/24/2022 0932   VLDL 21.4 03/24/2022 0932   LDLCALC 61 03/24/2022 0932     Risk Assessment/Calculations:         Physical Exam:    VS:  BP 124/70 (BP Location: Left Arm, Patient Position: Sitting, Cuff Size: Normal)   Pulse 64   Ht '5\' 8"'$  (1.727 m)   Wt 162 lb (73.5 kg)   SpO2 98%   BMI 24.63 kg/m     Wt Readings from Last 3 Encounters:  07/24/22 162 lb (73.5 kg)  06/30/22 160 lb (72.6 kg)  04/10/22 163 lb 6.4 oz (74.1 kg)     GEN:  Well nourished, well developed in no acute distress HEENT: Normal NECK: No JVD; No carotid bruits CARDIAC: RRR, no murmurs, rubs, gallops RESPIRATORY:  Clear to auscultation without rales, wheezing or rhonchi  ABDOMEN: Soft, non-tender, non-distended MUSCULOSKELETAL:  No  edema; No deformity  SKIN: Warm and dry NEUROLOGIC:  Alert and oriented x 3 PSYCHIATRIC:  Normal affect   ASSESSMENT:    1. Coronary artery disease involving native heart without angina pectoris, unspecified vessel or lesion type   2. Pure hypercholesterolemia    PLAN:    In order of problems listed above:  Moderate proximal left circumflex stenosis 50%, mild LAD disease.  Calcium score 299.  Continue Repatha.  LDL at goal.  Not on aspirin due to prior renal injury while on aspirin. Hyperlipidemia, cholesterol controlled.  Continue Repatha.  Follow-up yearly.       Medication Adjustments/Labs and Tests Ordered: Current medicines are reviewed at length with the patient today.  Concerns regarding medicines are outlined above.  Orders Placed This Encounter  Procedures   EKG 12-Lead   No orders of the defined types were placed in this encounter.   Patient Instructions  Medication Instructions:   Your physician recommends that you continue on your current medications as directed. Please refer to the Current Medication list given to you today.  *If you need a refill on your cardiac medications before your next appointment, please call your pharmacy*   Lab Work:  None Ordered  If you have labs (blood work) drawn today and your tests are completely normal, you will receive your results only by: [place] (if you have MyChart) OR A paper copy in the mail If you have any lab test that is abnormal or we need to change your treatment, we will call you to review the results.   Testing/Procedures:  None Ordered   Follow-Up: At [facility], you and your health needs are our priority.  As part of our continuing mission to provide you with exceptional heart care, we have created designated Provider Care Teams.  These Care Teams include your primary Cardiologist (physician) and Advanced Practice Providers (APPs -  Physician Assistants and Nurse Practitioners) who  all work together to provide you with the care you need, when you need it.  We recommend signing up for the patient portal called "MyChart".  Sign up information is provided on this After Visit Summary.  MyChart is used to connect with patients for Virtual Visits (Telemedicine).  Patients are able to view lab/test results, encounter notes, upcoming appointments, etc.  Non-urgent messages can be sent to your provider as well.   To learn more about what you can do with MyChart, go to [email or web address].    Your next appointment:   12 month(s)  Provider:   You may see Kate Sable, MD or one of the following Advanced Practice Providers on your designated Care Team:   Murray Hodgkins, NP Christell Faith, PA-C Cadence Kathlen Mody, PA-C Gerrie Nordmann, NP   Signed, Kate Sable, MD  07/24/2022 1:59 PM    [place]

## 2022-07-24 NOTE — Patient Instructions (Signed)
Medication Instructions:   Your physician recommends that you continue on your current medications as directed. Please refer to the Current Medication list given to you today.  *If you need a refill on your cardiac medications before your next appointment, please call your pharmacy*   Lab Work:  None Ordered  If you have labs (blood work) drawn today and your tests are completely normal, you will receive your results only by: MyChart Message (if you have MyChart) OR A paper copy in the mail If you have any lab test that is abnormal or we need to change your treatment, we will call you to review the results.   Testing/Procedures:  None Ordered   Follow-Up: At [facility], you and your health needs are our priority.  As part of our continuing mission to provide you with exceptional heart care, we have created designated Provider Care Teams.  These Care Teams include your primary Cardiologist (physician) and Advanced Practice Providers (APPs -  Physician Assistants and Nurse Practitioners) who all work together to provide you with the care you need, when you need it.  We recommend signing up for the patient portal called "MyChart".  Sign up information is provided on this After Visit Summary.  MyChart is used to connect with patients for Virtual Visits (Telemedicine).  Patients are able to view lab/test results, encounter notes, upcoming appointments, etc.  Non-urgent messages can be sent to your provider as well.   To learn more about what you can do with MyChart, go to [email or web address].    Your next appointment:   12 month(s)  Provider:   You may see Brian Agbor-Etang, MD or one of the following Advanced Practice Providers on your designated Care Team:   Christopher Berge, NP Ryan Dunn, PA-C Cadence Furth, PA-C Sheri Hammock, NP  

## 2022-07-27 NOTE — Telephone Encounter (Signed)
documented

## 2022-08-17 ENCOUNTER — Encounter: Payer: Self-pay | Admitting: Internal Medicine

## 2022-08-17 NOTE — Telephone Encounter (Signed)
Pt scheduled for 840 tomorrow with Ollen Gross

## 2022-08-18 ENCOUNTER — Encounter: Payer: Self-pay | Admitting: Nurse Practitioner

## 2022-08-18 ENCOUNTER — Ambulatory Visit (INDEPENDENT_AMBULATORY_CARE_PROVIDER_SITE_OTHER): Payer: PPO | Admitting: Nurse Practitioner

## 2022-08-18 VITALS — BP 138/62 | HR 56 | Temp 97.5°F | Ht 68.0 in | Wt 162.4 lb

## 2022-08-18 DIAGNOSIS — R0789 Other chest pain: Secondary | ICD-10-CM | POA: Diagnosis not present

## 2022-08-18 NOTE — Progress Notes (Signed)
  Tomasita Morrow, NP-C Phone: [phone]  Elijah Walker is a 72 y.o. male who presents today for chest discomfort.   Patient reports recent intermittent chest discomfort on the left side of his chest. 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 He denies shortness of breath. Denies radiation. Reports mild tenderness to the touch on the left side of his chest. Reports the discomfort moves across his chest, not in one place. He has been exercising daily on the treadmill without difficulty.    Social History   Tobacco Use  Smoking Status Never  Smokeless Tobacco Never    Current Outpatient Medications on File Prior to Visit  Medication Sig Dispense Refill   calcium-vitamin D (OSCAL WITH D) 500-5 MG-MCG tablet Take 1 tablet by mouth daily with breakfast.     REPATHA SURECLICK 332 MG/ML SOAJ INJECT 140 MG INTO THE SKIN EVERY 14 (FOURTEEN) DAYS. 6 mL 1   MAGNESIUM PO Take by mouth. (Patient not taking: Reported on 06/30/2022)     Menaquinone-7 (VITAMIN K2 PO) Take 200 mg by mouth. (Patient not taking: Reported on 06/30/2022)     Multiple Vitamins-Minerals (VITAMIN D3 COMPLETE PO) Take by mouth.     Omega-3 Fatty Acids (OMEGA 3 PO) Take 4,000 mg by mouth daily.     No current facility-administered medications on file prior to visit.     ROS see history of present illness  Objective  Physical Exam Vitals:   08/18/22 0850  BP: 138/62  Pulse: (!) 56  Temp: (!) 97.5 F (36.4 C)  SpO2: 97%    BP Readings from Last 3 Encounters:  08/18/22 138/62  07/24/22 124/70  06/30/22 116/60   Wt Readings from Last 3 Encounters:  08/18/22 162 lb 6.4 oz (73.7 kg)  07/24/22 162 lb (73.5 kg)  06/30/22 160 lb (72.6 kg)    Physical Exam Constitutional:      General: He is not in acute distress.    Appearance: Normal appearance.  HENT:     Head: Normocephalic.  Cardiovascular:     Rate and Rhythm: Normal rate and regular rhythm.     Heart  sounds: Normal heart sounds. No murmur heard.    No friction rub. No gallop.  Pulmonary:     Effort: Pulmonary effort is normal.     Breath sounds: Normal breath sounds.  Musculoskeletal:        General: Tenderness (left chest with deep palpation) present. No swelling or deformity. Normal range of motion.  Skin:    General: Skin is warm and dry.  Neurological:     General: No focal deficit present.     Mental Status: He is alert.  Psychiatric:        Mood and Affect: Mood normal.        Behavior: Behavior normal.    Assessment/Plan: Please see individual problem list.  Chest discomfort Assessment & Plan: Consistent with musculoskeletal chest wall sprain from sledge hammer use. Discomfort is reproducible, left chest with mild tenderness on palpation. EKG without acute findings. Discussed with patient importance of seeking care if worsening or changing symptoms. Follow up with Cardiology.   Orders: -     EKG 12-Lead   Return if symptoms worsen or fail to improve.   Tomasita Morrow, NP-C [place]

## 2022-08-18 NOTE — Assessment & Plan Note (Addendum)
Consistent with musculoskeletal chest wall sprain from sledge hammer use. Discomfort is reproducible, left chest with mild tenderness on palpation. EKG without acute findings. Discussed with patient importance of seeking care if worsening or changing symptoms. Follow up with Cardiology.

## 2022-10-01 ENCOUNTER — Other Ambulatory Visit: Payer: Self-pay | Admitting: Internal Medicine

## 2022-10-01 DIAGNOSIS — S0502XA Injury of conjunctiva and corneal abrasion without foreign body, left eye, initial encounter: Secondary | ICD-10-CM | POA: Diagnosis not present

## 2022-10-01 DIAGNOSIS — E7800 Pure hypercholesterolemia, unspecified: Secondary | ICD-10-CM

## 2022-10-03 ENCOUNTER — Encounter: Payer: Self-pay | Admitting: Internal Medicine

## 2022-10-03 DIAGNOSIS — E7800 Pure hypercholesterolemia, unspecified: Secondary | ICD-10-CM

## 2022-10-03 DIAGNOSIS — D649 Anemia, unspecified: Secondary | ICD-10-CM

## 2022-10-03 DIAGNOSIS — R739 Hyperglycemia, unspecified: Secondary | ICD-10-CM

## 2022-10-06 DIAGNOSIS — S0502XD Injury of conjunctiva and corneal abrasion without foreign body, left eye, subsequent encounter: Secondary | ICD-10-CM | POA: Diagnosis not present

## 2022-10-15 ENCOUNTER — Other Ambulatory Visit (INDEPENDENT_AMBULATORY_CARE_PROVIDER_SITE_OTHER): Payer: PPO

## 2022-10-15 DIAGNOSIS — R739 Hyperglycemia, unspecified: Secondary | ICD-10-CM | POA: Diagnosis not present

## 2022-10-15 DIAGNOSIS — D649 Anemia, unspecified: Secondary | ICD-10-CM

## 2022-10-15 DIAGNOSIS — E7800 Pure hypercholesterolemia, unspecified: Secondary | ICD-10-CM | POA: Diagnosis not present

## 2022-10-15 LAB — CBC WITH DIFFERENTIAL/PLATELET
Basophils Absolute: 0 10*3/uL (ref 0.0–0.1)
Basophils Relative: 0.7 % (ref 0.0–3.0)
Eosinophils Absolute: 0.3 10*3/uL (ref 0.0–0.7)
Eosinophils Relative: 5.7 % — ABNORMAL HIGH (ref 0.0–5.0)
HCT: 47 % (ref 39.0–52.0)
Hemoglobin: 15.6 g/dL (ref 13.0–17.0)
Lymphocytes Relative: 33.3 % (ref 12.0–46.0)
Lymphs Abs: 1.7 10*3/uL (ref 0.7–4.0)
MCHC: 33.2 g/dL (ref 30.0–36.0)
MCV: 98.3 fl (ref 78.0–100.0)
Monocytes Absolute: 0.5 10*3/uL (ref 0.1–1.0)
Monocytes Relative: 9.7 % (ref 3.0–12.0)
Neutro Abs: 2.5 10*3/uL (ref 1.4–7.7)
Neutrophils Relative %: 50.6 % (ref 43.0–77.0)
Platelets: 212 10*3/uL (ref 150.0–400.0)
RBC: 4.78 Mil/uL (ref 4.22–5.81)
RDW: 13.5 % (ref 11.5–15.5)
WBC: 5 10*3/uL (ref 4.0–10.5)

## 2022-10-15 LAB — LIPID PANEL
Cholesterol: 136 mg/dL (ref 0–200)
HDL: 49.5 mg/dL (ref >39.00)
LDL Cholesterol: 64 mg/dL (ref 0–99)
NonHDL: 86.09
Total CHOL/HDL Ratio: 3
Triglycerides: 111 mg/dL (ref 0.0–149.0)
VLDL: 22.2 mg/dL (ref 0.0–40.0)

## 2022-10-15 LAB — HEPATIC FUNCTION PANEL
ALT: 15 U/L (ref 0–53)
AST: 17 U/L (ref 0–37)
Albumin: 4.4 g/dL (ref 3.5–5.2)
Alkaline Phosphatase: 68 U/L (ref 39–117)
Bilirubin, Direct: 0.2 mg/dL (ref 0.0–0.3)
Total Bilirubin: 0.8 mg/dL (ref 0.2–1.2)
Total Protein: 6.6 g/dL (ref 6.0–8.3)

## 2022-10-15 LAB — BASIC METABOLIC PANEL
BUN: 11 mg/dL (ref 6–23)
CO2: 29 mEq/L (ref 19–32)
Calcium: 9.5 mg/dL (ref 8.4–10.5)
Chloride: 104 mEq/L (ref 96–112)
Creatinine, Ser: 1.14 mg/dL (ref 0.40–1.50)
GFR: 64.81 mL/min (ref >60.00)
Glucose, Bld: 94 mg/dL (ref 70–99)
Potassium: 4.1 mEq/L (ref 3.5–5.1)
Sodium: 142 mEq/L (ref 135–145)

## 2022-10-15 LAB — TSH: TSH: 2.18 u[IU]/mL (ref 0.35–5.50)

## 2022-10-15 LAB — HEMOGLOBIN A1C: Hgb A1c MFr Bld: 5.8 % (ref 4.6–6.5)

## 2022-10-22 ENCOUNTER — Other Ambulatory Visit: Payer: PPO

## 2022-10-27 ENCOUNTER — Ambulatory Visit (INDEPENDENT_AMBULATORY_CARE_PROVIDER_SITE_OTHER): Payer: PPO | Admitting: Internal Medicine

## 2022-10-27 ENCOUNTER — Encounter: Payer: Self-pay | Admitting: Internal Medicine

## 2022-10-27 VITALS — BP 118/70 | HR 61 | Temp 98.0°F | Resp 16 | Ht 68.0 in | Wt 157.6 lb

## 2022-10-27 DIAGNOSIS — Z86010 Personal history of colonic polyps: Secondary | ICD-10-CM

## 2022-10-27 DIAGNOSIS — R931 Abnormal findings on diagnostic imaging of heart and coronary circulation: Secondary | ICD-10-CM | POA: Diagnosis not present

## 2022-10-27 DIAGNOSIS — Z125 Encounter for screening for malignant neoplasm of prostate: Secondary | ICD-10-CM

## 2022-10-27 DIAGNOSIS — E7800 Pure hypercholesterolemia, unspecified: Secondary | ICD-10-CM

## 2022-10-27 DIAGNOSIS — D649 Anemia, unspecified: Secondary | ICD-10-CM

## 2022-10-27 DIAGNOSIS — R739 Hyperglycemia, unspecified: Secondary | ICD-10-CM

## 2022-10-27 MED ORDER — REPATHA SURECLICK 140 MG/ML ~~LOC~~ SOAJ: 140.0000 mg | SUBCUTANEOUS | 1 refills | Status: DC

## 2022-10-27 NOTE — Progress Notes (Signed)
Subjective:    Patient ID: Elijah Walker, male    DOB: Oct 03, 1950, 72 y.o.   MRN: [id number]  Patient here for  Chief Complaint  Patient presents with   Medical Management of Chronic Issues    HPI Here to follow up regarding hypercholesterolemia.  Had f/u with cardiology 07/24/22.  Previously seen due to chest pain.  Coronary CTA showed nonobstructive CAD.  Echo showed normal systolic function EF 60 to 65%. No changes made at 07/24/22 visit - stable. Is very active.  Reviewed labs.  No chest pain or sob reported.  No abdominal pain or bowel change reported. Had an episode yesterday - working in direct sun - bending over - when stood up - felt a little "light headed" for a few seconds - resolved quickly.  Continued working after resolved.  Exercises 6 days per week  - hard work outs - no problem.    Past Medical History:  Diagnosis Date   Blood in stool    H/O   History of chicken pox    Hyperlipidemia    PONV (postoperative nausea and vomiting)    Past Surgical History:  Procedure Laterality Date   CHOLECYSTECTOMY  1998   COLONOSCOPY     SHOULDER ARTHROSCOPY WITH BANKART REPAIR Left 09/14/2017   Procedure: SHOULDER ARTHROSCOPY WITH MINI OPEN ROTATOR CUFF REPAIR, SUBACROMINAL DECOMPRESSION, LABRAL REPAIR,BICEP TENOTOMY, DISTAL CLAVICLE EXCISION ;  Surgeon: Juanell Fairly, MD;  Location: [facility] OR[facility];  Service: Orthopedics;  Laterality: Left;   SHOULDER ARTHROSCOPY WITH OPEN ROTATOR CUFF REPAIR AND DISTAL CLAVICLE ACROMINECTOMY Right 01/16/2021   Procedure: RIGHT SHOULDER ARTHROSCOPY WITH MINI-OPEN ROTATOR CUFF REPAIR, SUBACCROMINAL DECOMPRESSION AND DISTAL CLAVICLE EXCISION;  Surgeon: Juanell Fairly, MD;  Location: [facility];  Service: Orthopedics;  Laterality: Right;   Family History  Problem Relation Age of Onset   Dementia Mother    Diabetes Father    Colitis Sister    Autoimmune disease Brother    Colitis Daughter    Social History   Socioeconomic History   Marital status:  Married    Spouse name: Not on file   Number of children: Not on file   Years of education: Not on file   Highest education level: Not on file  Occupational History   Not on file  Tobacco Use   Smoking status: Never   Smokeless tobacco: Never  Vaping Use   Vaping Use: Never used  Substance and Sexual Activity   Alcohol use: No    Alcohol/week: 0.0 standard drinks of alcohol    Comment: occ   Drug use: No   Sexual activity: Yes  Other Topics Concern   Not on file  Social History Narrative   Not on file   Social Determinants of Health   Financial Resource Strain: Low Risk  (06/30/2022)   Overall Financial Resource Strain (CARDIA)    Difficulty of Paying Living Expenses: Not hard at all  Food Insecurity: No Food Insecurity (06/30/2022)   Hunger Vital Sign    Worried About Running Out of Food in the Last Year: Never true    Ran Out of Food in the Last Year: Never true  Transportation Needs: No Transportation Needs (06/30/2022)   PRAPARE - Administrator, Civil Service (Medical): No    Lack of Transportation (Non-Medical): No  Physical Activity: Insufficiently Active (06/30/2022)   Exercise Vital Sign    Days of Exercise per Week: 3 days    Minutes of Exercise per Session: 30 min  Stress: No Stress Concern Present (06/30/2022)   [employer] of Occupational Health - Occupational Stress Questionnaire    Feeling of Stress : Not at all  Social Connections: Unknown (06/30/2022)   Social Connection and Isolation Panel [NHANES]    Frequency of Communication with Friends and Family: Not on file    Frequency of Social Gatherings with Friends and Family: Not on file    Attends Religious Services: Not on file    Active Member of Clubs or Organizations: Not on file    Attends Banker Meetings: Not on file    Marital Status: Married     Review of Systems  Constitutional:  Negative for appetite change and unexpected weight change.  HENT:  Negative  for congestion and sinus pressure.   Respiratory:  Negative for cough, chest tightness and shortness of breath.   Cardiovascular:  Negative for chest pain and palpitations.  Gastrointestinal:  Negative for abdominal pain, diarrhea, nausea and vomiting.  Genitourinary:  Negative for difficulty urinating and dysuria.  Musculoskeletal:  Negative for joint swelling and myalgias.  Skin:  Negative for color change and rash.  Neurological:  Negative for dizziness and headaches.  Psychiatric/Behavioral:  Negative for agitation and dysphoric mood.        Objective:     BP 118/70   Pulse 61   Temp 98 F (36.7 C)   Resp 16   Ht 5\' 8"  (1.727 m)   Wt 157 lb 9.6 oz (71.5 kg)   SpO2 98%   BMI 23.96 kg/m  Wt Readings from Last 3 Encounters:  10/27/22 157 lb 9.6 oz (71.5 kg)  08/18/22 162 lb 6.4 oz (73.7 kg)  07/24/22 162 lb (73.5 kg)    Physical Exam Vitals reviewed.  Constitutional:      General: He is not in acute distress.    Appearance: Normal appearance. He is well-developed.  HENT:     Head: Normocephalic and atraumatic.     Right Ear: External ear normal.     Left Ear: External ear normal.  Eyes:     General: No scleral icterus.       Right eye: No discharge.        Left eye: No discharge.     Conjunctiva/sclera: Conjunctivae normal.  Cardiovascular:     Rate and Rhythm: Normal rate and regular rhythm.  Pulmonary:     Effort: Pulmonary effort is normal. No respiratory distress.     Breath sounds: Normal breath sounds.  Abdominal:     General: Bowel sounds are normal.     Palpations: Abdomen is soft.     Tenderness: There is no abdominal tenderness.  Musculoskeletal:        General: No swelling or tenderness.     Cervical back: Neck supple. No tenderness.  Lymphadenopathy:     Cervical: No cervical adenopathy.  Skin:    Findings: No erythema or rash.  Neurological:     Mental Status: He is alert.  Psychiatric:        Mood and Affect: Mood normal.         Behavior: Behavior normal.      Outpatient Encounter Medications as of 10/27/2022  Medication Sig   calcium-vitamin D (OSCAL WITH D) 500-5 MG-MCG tablet Take 1 tablet by mouth daily with breakfast.   Evolocumab (REPATHA SURECLICK) 140 MG/ML SOAJ Inject 140 mg into the skin every 14 (fourteen) days.   Omega 3 1200 MG CAPS Take 6,000 mg by mouth daily.   [  DISCONTINUED] Evolocumab (REPATHA SURECLICK) 140 MG/ML SOAJ INJECT 140 MG INTO THE SKIN EVERY 14 (FOURTEEN) DAYS.   No facility-administered encounter medications on file as of 10/27/2022.     Lab Results  Component Value Date   WBC 5.0 10/15/2022   HGB 15.6 10/15/2022   HCT 47.0 10/15/2022   PLT 212.0 10/15/2022   GLUCOSE 94 10/15/2022   CHOL 136 10/15/2022   TRIG 111.0 10/15/2022   HDL 49.50 10/15/2022   LDLCALC 64 10/15/2022   ALT 15 10/15/2022   AST 17 10/15/2022   NA 142 10/15/2022   K 4.1 10/15/2022   CL 104 10/15/2022   CREATININE 1.14 10/15/2022   BUN 11 10/15/2022   CO2 29 10/15/2022   TSH 2.18 10/15/2022   PSA 1.78 06/02/2021   INR 1.0 08/30/2017   HGBA1C 5.8 10/15/2022   MICROALBUR <0.7 07/20/2016    US Renal  Result Date: 04/02/2022 CLINICAL DATA:  Decreased GFR. EXAM: RENAL / URINARY TRACT ULTRASOUND COMPLETE COMPARISON:  None Available. FINDINGS: Right Kidney: Renal measurements: 10.9 x 4.7 x 5.1 cm = volume: 137 mL. Echogenicity within normal limits. No mass or hydronephrosis visualized. Left Kidney: Renal measurements: 11.0 x 5.1 x 4.4 cm = volume: 130 mL. Echogenicity within normal limits. No mass or hydronephrosis visualized. Bladder: Appears normal for degree of bladder distention. Other: None. IMPRESSION: Normal study.  No cause for decreasing GFR identified. Electronically Signed   By: Gerome Sam III M.D.   On: 04/02/2022 11:39       Assessment & Plan:  Hypercholesterolemia Assessment & Plan: On repatha.  Monitors diet.  Cholesterol much improved.  Follow lipid panel.   Lab Results  Component  Value Date   CHOL 136 10/15/2022   HDL 49.50 10/15/2022   LDLCALC 64 10/15/2022   TRIG 111.0 10/15/2022   CHOLHDL 3 10/15/2022    Orders: -     Lipid panel; Future -     Basic metabolic panel; Future -     Hepatic function panel; Future -     Repatha SureClick; Inject 140 mg into the skin every 14 (fourteen) days.  Dispense: 6 mL; Refill: 1  Hyperglycemia -     Hemoglobin A1c; Future  Prostate cancer screening -     PSA, Medicare; Future  Anemia, unspecified type Assessment & Plan: Follow cbc.    Elevated coronary artery calcium score Assessment & Plan: Coronary CTA showed nonobstructive CAD.  Echo showed normal systolic function EF 60 to 65%. No changes made at 07/24/22 visit - stable. Is very active.    History of colonic polyps Assessment & Plan: Colonoscopy 10/2013.  Recommended f/u in 10 years.        Dale [place], MD

## 2022-10-27 NOTE — Assessment & Plan Note (Signed)
Coronary CTA showed nonobstructive CAD.  Echo showed normal systolic function EF 60 to 65%. No changes made at 07/24/22 visit - stable. Is very active.

## 2022-10-27 NOTE — Assessment & Plan Note (Signed)
Colonoscopy 10/2013.  Recommended f/u in 10 years.   

## 2022-10-27 NOTE — Assessment & Plan Note (Signed)
Follow cbc.  

## 2022-10-27 NOTE — Assessment & Plan Note (Signed)
On repatha.  Monitors diet.  Cholesterol much improved.  Follow lipid panel.   Lab Results  Component Value Date   CHOL 136 10/15/2022   HDL 49.50 10/15/2022   LDLCALC 64 10/15/2022   TRIG 111.0 10/15/2022   CHOLHDL 3 10/15/2022

## 2023-01-18 ENCOUNTER — Encounter: Payer: Self-pay | Admitting: Internal Medicine

## 2023-01-18 DIAGNOSIS — E7800 Pure hypercholesterolemia, unspecified: Secondary | ICD-10-CM

## 2023-01-20 NOTE — Telephone Encounter (Signed)
He will be due for fasting labs in August. Do you want to repeat anything before then?

## 2023-01-20 NOTE — Telephone Encounter (Signed)
I am ok to schedule a met b if desires before next fasting labs.  Let him know that fasting labs will be done in 02/2023.

## 2023-02-21 ENCOUNTER — Encounter: Payer: Self-pay | Admitting: Internal Medicine

## 2023-02-25 ENCOUNTER — Other Ambulatory Visit (INDEPENDENT_AMBULATORY_CARE_PROVIDER_SITE_OTHER): Payer: PPO

## 2023-02-25 ENCOUNTER — Encounter: Payer: Self-pay | Admitting: Nurse Practitioner

## 2023-02-25 ENCOUNTER — Ambulatory Visit: Payer: PPO | Admitting: Nurse Practitioner

## 2023-02-25 VITALS — BP 130/60 | HR 52 | Temp 98.5°F | Ht 68.0 in | Wt 160.6 lb

## 2023-02-25 DIAGNOSIS — L989 Disorder of the skin and subcutaneous tissue, unspecified: Secondary | ICD-10-CM | POA: Diagnosis not present

## 2023-02-25 DIAGNOSIS — E7800 Pure hypercholesterolemia, unspecified: Secondary | ICD-10-CM

## 2023-02-25 LAB — BASIC METABOLIC PANEL
BUN: 13 mg/dL (ref 6–23)
CO2: 26 mEq/L (ref 19–32)
Calcium: 9.2 mg/dL (ref 8.4–10.5)
Chloride: 104 mEq/L (ref 96–112)
Creatinine, Ser: 1.01 mg/dL (ref 0.40–1.50)
GFR: 74.76 mL/min (ref >60.00)
Glucose, Bld: 104 mg/dL — ABNORMAL HIGH (ref 70–99)
Potassium: 4 mEq/L (ref 3.5–5.1)
Sodium: 137 mEq/L (ref 135–145)

## 2023-02-25 NOTE — Progress Notes (Signed)
  Bethanie Dicker, NP-C Phone: [phone]  Elijah Walker is a 72 y.o. male who presents today for skin lesions.  Patient with spot on left forearm for the last 6-9 months that is getting larger. The area is occasionally pruritic. He also notes another area on his right elbow that is getting darker in color. This spot does not itch. Neither are painful. Denies bleeding. He denies any new changes or exposures. He is already established with Dermatology. He has not had either area evaluated before.   Social History   Tobacco Use  Smoking Status Never  Smokeless Tobacco Never    Current Outpatient Medications on File Prior to Visit  Medication Sig Dispense Refill   calcium-vitamin D (OSCAL WITH D) 500-5 MG-MCG tablet Take 1 tablet by mouth daily with breakfast.     Evolocumab (REPATHA SURECLICK) 140 MG/ML SOAJ Inject 140 mg into the skin every 14 (fourteen) days. 6 mL 1   Omega 3 1200 MG CAPS Take 6,000 mg by mouth daily.     No current facility-administered medications on file prior to visit.    ROS see history of present illness  Objective  Physical Exam Vitals:   02/25/23 0840  BP: 130/60  Pulse: (!) 52  Temp: 98.5 F (36.9 C)  SpO2: 98%    BP Readings from Last 3 Encounters:  02/25/23 130/60  10/27/22 118/70  08/18/22 138/62   Wt Readings from Last 3 Encounters:  02/25/23 160 lb 9.6 oz (72.8 kg)  10/27/22 157 lb 9.6 oz (71.5 kg)  08/18/22 162 lb 6.4 oz (73.7 kg)    Physical Exam Constitutional:      General: He is not in acute distress.    Appearance: Normal appearance.  HENT:     Head: Normocephalic.  Cardiovascular:     Rate and Rhythm: Normal rate and regular rhythm.     Heart sounds: Normal heart sounds.  Pulmonary:     Effort: Pulmonary effort is normal.     Breath sounds: Normal breath sounds.  Skin:    General: Skin is warm and dry.     Findings: Lesion (x 2, see pictures below) present.  Neurological:     General: No focal deficit present.      Mental Status: He is alert.  Psychiatric:        Mood and Affect: Mood normal.        Behavior: Behavior normal.   Left Forearm    Right Elbow  Assessment/Plan: Please see individual problem list.  Skin lesions Assessment & Plan: Area on left forearm and right elbow. Established already with Dermatology. Encouraged to schedule follow up for evaluation of changing lesions.     Return if symptoms worsen or fail to improve.   Bethanie Dicker, NP-C [facility] [employer]

## 2023-03-09 NOTE — Assessment & Plan Note (Addendum)
Area on left forearm and right elbow. Established already with Dermatology. Encouraged to schedule follow up for evaluation of changing lesions.

## 2023-04-07 DIAGNOSIS — D2261 Melanocytic nevi of right upper limb, including shoulder: Secondary | ICD-10-CM | POA: Diagnosis not present

## 2023-04-07 DIAGNOSIS — D2272 Melanocytic nevi of left lower limb, including hip: Secondary | ICD-10-CM | POA: Diagnosis not present

## 2023-04-07 DIAGNOSIS — D2262 Melanocytic nevi of left upper limb, including shoulder: Secondary | ICD-10-CM | POA: Diagnosis not present

## 2023-04-07 DIAGNOSIS — L821 Other seborrheic keratosis: Secondary | ICD-10-CM | POA: Diagnosis not present

## 2023-04-07 DIAGNOSIS — D2271 Melanocytic nevi of right lower limb, including hip: Secondary | ICD-10-CM | POA: Diagnosis not present

## 2023-04-07 DIAGNOSIS — D225 Melanocytic nevi of trunk: Secondary | ICD-10-CM | POA: Diagnosis not present

## 2023-04-22 ENCOUNTER — Encounter: Payer: Self-pay | Admitting: Internal Medicine

## 2023-04-22 DIAGNOSIS — E7800 Pure hypercholesterolemia, unspecified: Secondary | ICD-10-CM

## 2023-04-22 NOTE — Telephone Encounter (Signed)
Can we refer him for medication assistance?  If not, he has tried lipitor, crestor and pravastatin. Please schedule him an appt to discuss treatment options.

## 2023-04-26 NOTE — Telephone Encounter (Signed)
Yes. I am ok with giving him a sample.

## 2023-04-26 NOTE — Telephone Encounter (Signed)
We have 2 samples of Repatha. Is it okay to give him one?

## 2023-04-29 ENCOUNTER — Telehealth: Payer: Self-pay

## 2023-04-29 NOTE — Progress Notes (Unsigned)
Care Guide Note  04/29/2023 Name: Amol Meah MRN: [id number] DOB: 10-09-1950  Referred by: Dale [place], MD Reason for referral : Care Coordination (Outreach to schedule with Pharm d )   Jhase Rothwell is a 72 y.o. year old male who is a primary care patient of Dale [place], MD. Ismaeel Birchard was referred to the pharmacist for assistance related to HLD.    An unsuccessful telephone outreach was attempted today to contact the patient who was referred to the pharmacy team for assistance with medication management. Additional attempts will be made to contact the patient.   Penne Lash, RMA Care Guide [facility]  [place], [place] [address] Direct Dial: [phone] [email or web address]

## 2023-05-03 NOTE — Progress Notes (Unsigned)
Care Guide Note  05/03/2023 Name: Elijah Walker MRN: [id number] DOB: Dec 09, 1950  Referred by: Dale [place], MD Reason for referral : Care Coordination (Outreach to schedule with Pharm d )   Elijah Walker is a 72 y.o. year old male who is a primary care patient of Dale [place], MD. Elijah Walker was referred to the pharmacist for assistance related to HLD.    A second unsuccessful telephone outreach was attempted today to contact the patient who was referred to the pharmacy team for assistance with medication assistance. Additional attempts will be made to contact the patient.  Penne Lash, RMA Care Guide [facility]  [place], [place] [address] Direct Dial: [phone] [email or web address]

## 2023-05-04 NOTE — Progress Notes (Signed)
Care Guide Note  05/04/2023 Name: Elijah Walker MRN: [id number] DOB: August 30, 1950  Referred by: Dale Lena, MD Reason for referral : Care Coordination (Outreach to schedule with Pharm d )   Elijah Walker is a 72 y.o. year old male who is a primary care patient of Dale [place], MD. Elijah Walker was referred to the pharmacist for assistance related to HLD.    Successful contact was made with the patient to discuss pharmacy services including being ready for the pharmacist to call at least 5 minutes before the scheduled appointment time, to have medication bottles and any blood sugar or blood pressure readings ready for review. The patient agreed to meet with the pharmacist via with the pharmacist via telephone visit on (date/time).  05/10/2023  Penne Lash, RMA Care Guide [facility]  [place], [place] [address] Direct Dial: [phone] [email or web address]

## 2023-05-10 ENCOUNTER — Other Ambulatory Visit: Payer: PPO | Admitting: Pharmacist

## 2023-05-10 NOTE — Patient Instructions (Signed)
We have started your application for the [employer] program. If approved, they will provide Repatha to you at no cost to you through the end of the year.  Upon receiving your signature and income documentation, we will fax the completed application to the program. Processing can take up to a week, and medication shipment up to 10-14 days through standard mail.    [email or web address] Address: [address] [address], [place] [address] Phone: [phone]  Fax: [phone]   What to expect You and your physician will both be notified once a decision is made. All applications are processed on a first come first served basis.  Applications missing any of the required information noted above will be put on hold, which may result in processing delays.  If you are approved, you will be contacted by a Patient Assistance Counselor to obtain your consent to schedule a shipment of your [employer] medicine. Medication will be shipped directly to your home.    [employer] Phone: [phone], Monday through Friday 8 a.m. to 8 p.m. Eastern Time (ET). Request updates on shipments, shipping status, refills, program enrollment status, re-enrollment, etc.      Loree Fee, PharmD Clinical Pharmacist [facility] [phone]

## 2023-05-10 NOTE — Progress Notes (Signed)
05/10/2023 Name: Elijah Walker MRN: [id number] DOB: [birth year]  Subjective  Chief Complaint  Patient presents with   Medication access   Hyperlipidemia    Reason for visit: Elijah Walker is a 72 y.o. year old male who presented for a telephone visit.  They were referred to the pharmacist by their PCP for assistance in managing hyperlipidemia and medication access.  Care Team: Primary Care Provider: Dale Stewart, MD  Reason for visit: ?  Elijah Walker is a 72 y.o. male who presents today for a phone visit with the pharmacist for hyperlipidemia management/medication access. Unable to tolerate statin medications. Repatha not affordable through insurance.   Medication Access: ?  Prescription drug coverage: Payor: [employer] / Plan: [employer] PPO / Product Type: *No Product type* / .  - Current Patient Assistance:  None - Patient lives in a household of 2 with an estimated combined monthly income of $1600  via Social Security Retirement. - Medicare LIS Eligible: No   HPI:  Current HLD Medications: Repatha (Used sample 2 weeks ago, no medication remaining).   HLD Medications Tried in the Past:  Statin: has tried atorvastatin, rosuvastatin and pravastatin with intolerance to each.  Repatha: Not affordable through insurance (reports $1700 copay which seems like cash price)  Assessment and Plan:   1. HLD/Medication Access. Through [employer], PCSK9i are Tier 3, PA required, QL. Unclear if in donut hole, or if PA was submitted though his copay seems closer to cash price. Will re-submit PA if denied for MAP. Last sample used 2 weeks ago. Will call clinic to see if additional samples are available. Pt aware that MAP process can take several weeks. Okay to miss doses in the meantime without c/f withdrawal/effects.  Patient lives in a household of 2 with an estimated combined annual income of $19,200  and therefore may be eligible for [employer] MAP.  [employer]   application filled out today, provider page placed in PCP inbox. Patient portion placed at front desk for signature.  Patient forms: Patient would like to sign forms in person. Patient portion placed in front office.  Income documentation: Patient plans to drop a copy off at the front desk later this week ~Wed-Thurs. Patient is not eligible for copay cards due to government insurance. Upon completion of signed forms, will fax full application to program and upload in Media Tab. [employer] Fax:  [phone].  No future appointments.  Loree Fee, PharmD Clinical Pharmacist [facility] [phone]

## 2023-05-12 NOTE — Telephone Encounter (Signed)
Ok to given pt samples.

## 2023-05-12 NOTE — Telephone Encounter (Signed)
Is it okay to give pt a sample of Repatha if we have any?

## 2023-05-13 ENCOUNTER — Encounter: Payer: Self-pay | Admitting: Internal Medicine

## 2023-05-13 NOTE — Telephone Encounter (Signed)
No samples in office. My chart sent to patient

## 2023-05-24 NOTE — Telephone Encounter (Signed)
Ok to check labs

## 2023-05-26 ENCOUNTER — Telehealth: Payer: Self-pay

## 2023-05-26 NOTE — Telephone Encounter (Signed)
Future labs in system.

## 2023-05-26 NOTE — Telephone Encounter (Signed)
Please see paper from [employer] about patient assistance denial. This is the patient I spoke with you about on Tuesday. Paper placed on your desk.

## 2023-05-26 NOTE — Telephone Encounter (Signed)
LM for patient. His patient assistance was denied. Dr Lorin Picket is ok with doing labs. I have sent message to pharmacist to assist with patient denial

## 2023-05-26 NOTE — Telephone Encounter (Signed)
Patient states he is returning a call from Rita Ohara, LPN.  I spoke with Bethann Berkshire who states patient assistance has been denied and they are not sure why.  Bethann Berkshire states they have sent a message to the pharmacy.  Bethann Berkshire states we need to schedule an appointment for patient to have fasting labs.  I relayed message to patient.  I scheduled an appointment for patient to have fasting labs drawn on 06/03/2023.

## 2023-06-01 ENCOUNTER — Other Ambulatory Visit: Payer: Self-pay | Admitting: Pharmacist

## 2023-06-01 ENCOUNTER — Encounter: Payer: Self-pay | Admitting: Pharmacist

## 2023-06-01 NOTE — Progress Notes (Signed)
Patient Repatha MAP application Denied. Liekly due to other possible coverage oportunities.   With recent re-opening of the [employer] HLD Medicare [employer], patient is likely eligible to receive copay support.   Called and spoke to patient regarding the grant. Assisted him with online enrollment.  Patient HealthWell Details outlines below:  Date Portal Grant Approval Letter Generated: 06/01/2023 Patient's Name: Zebediah Veillette HealthWell Identification Number: [id number] Fund: Hypercholesterolemia - Medicare Access Enrollment Period: 05/02/2023 through 04/30/2024 Kennedy Bucker Amount: $2,500.00  Pharmacy Card Card No [id number] BIN [id number] PCN [id number] Group [id number]  Help Desk [id number]   Reviewed with patient. Mychart message sent with program information.   Loree Fee, PharmD Clinical Pharmacist [facility] [phone]

## 2023-06-01 NOTE — Telephone Encounter (Signed)
Thank you Lillia Abed.  I really appreciate it.

## 2023-06-03 ENCOUNTER — Other Ambulatory Visit: Payer: PPO

## 2023-06-03 DIAGNOSIS — Z125 Encounter for screening for malignant neoplasm of prostate: Secondary | ICD-10-CM

## 2023-06-03 DIAGNOSIS — R739 Hyperglycemia, unspecified: Secondary | ICD-10-CM | POA: Diagnosis not present

## 2023-06-03 DIAGNOSIS — E7800 Pure hypercholesterolemia, unspecified: Secondary | ICD-10-CM | POA: Diagnosis not present

## 2023-06-03 LAB — LIPID PANEL
Cholesterol: 171 mg/dL (ref 0–200)
HDL: 47.5 mg/dL (ref >39.00)
LDL Cholesterol: 96 mg/dL (ref 0–99)
NonHDL: 123.08
Total CHOL/HDL Ratio: 4
Triglycerides: 137 mg/dL (ref 0.0–149.0)
VLDL: 27.4 mg/dL (ref 0.0–40.0)

## 2023-06-03 LAB — HEPATIC FUNCTION PANEL
ALT: 13 U/L (ref 0–53)
AST: 15 U/L (ref 0–37)
Albumin: 4.4 g/dL (ref 3.5–5.2)
Alkaline Phosphatase: 71 U/L (ref 39–117)
Bilirubin, Direct: 0.2 mg/dL (ref 0.0–0.3)
Total Bilirubin: 1.1 mg/dL (ref 0.2–1.2)
Total Protein: 6.6 g/dL (ref 6.0–8.3)

## 2023-06-03 LAB — PSA, MEDICARE: PSA: 1.73 ng/mL (ref 0.10–4.00)

## 2023-06-03 LAB — BASIC METABOLIC PANEL
BUN: 13 mg/dL (ref 6–23)
CO2: 30 meq/L (ref 19–32)
Calcium: 9.6 mg/dL (ref 8.4–10.5)
Chloride: 104 meq/L (ref 96–112)
Creatinine, Ser: 1.14 mg/dL (ref 0.40–1.50)
GFR: 64.53 mL/min (ref >60.00)
Glucose, Bld: 97 mg/dL (ref 70–99)
Potassium: 5.1 meq/L (ref 3.5–5.1)
Sodium: 140 meq/L (ref 135–145)

## 2023-06-03 LAB — HEMOGLOBIN A1C: Hgb A1c MFr Bld: 6 % (ref 4.6–6.5)

## 2023-06-04 ENCOUNTER — Telehealth: Payer: Self-pay

## 2023-06-04 ENCOUNTER — Other Ambulatory Visit: Payer: Self-pay

## 2023-06-04 DIAGNOSIS — E875 Hyperkalemia: Secondary | ICD-10-CM

## 2023-06-04 NOTE — Telephone Encounter (Signed)
Pt called back and I read the note and he stated he is taking omega, vitamin D and eating a lot of vegetables. Pt stated he has no salt substitutes and everything is organic. Pt stated he did not have a lot of water when he did his labs

## 2023-06-04 NOTE — Telephone Encounter (Signed)
Noted. STAT potassium ordered.

## 2023-06-04 NOTE — Telephone Encounter (Signed)
-----   Message from [place] sent at 06/04/2023  4:15 AM EST ----- Notify - PSA stable and wnl. Potassium wnl, but at upper limits of normal. Confirm not taking any potassium supplements.  Also confirm not using an increased amount of salt substitutes.  Would like to recheck potassium (stat) in 7-10 days. Kidney function overall relatively stable. Overall sugar control relatively stable. Cholesterol levels slightly increased from last check.  Continue diet and exercise. Liver panel wnl.

## 2023-06-13 HISTORY — PX: OTHER SURGICAL HISTORY: SHX169

## 2023-06-18 ENCOUNTER — Other Ambulatory Visit (INDEPENDENT_AMBULATORY_CARE_PROVIDER_SITE_OTHER): Payer: PPO

## 2023-06-18 DIAGNOSIS — E875 Hyperkalemia: Secondary | ICD-10-CM | POA: Diagnosis not present

## 2023-06-18 LAB — POTASSIUM: Potassium: 4.5 meq/L (ref 3.5–5.1)

## 2023-06-21 ENCOUNTER — Encounter: Payer: Self-pay | Admitting: Internal Medicine

## 2023-06-22 ENCOUNTER — Telehealth: Payer: Self-pay | Admitting: Internal Medicine

## 2023-06-22 NOTE — Telephone Encounter (Signed)
Copied from CRM [id number]. Topic: Medicare AWV >> Jun 22, 2023  9:14 AM Payton Doughty wrote: Reason for CRM: Called LVM 06/22/2023 to schedule Annual Wellness Visit  Verlee Rossetti; Care Guide Ambulatory Clinical Support [place] [facility] Direct Dial: [phone]

## 2023-06-25 ENCOUNTER — Encounter: Payer: Self-pay | Admitting: Internal Medicine

## 2023-06-28 NOTE — Telephone Encounter (Signed)
Called patient to schedule an appointment with Dr Lorin Picket. She has some openings end of week. Ok to use any of them. LM for patient to return call.

## 2023-07-05 ENCOUNTER — Encounter: Payer: Self-pay | Admitting: Family

## 2023-07-05 ENCOUNTER — Ambulatory Visit: Payer: PPO | Admitting: Family

## 2023-07-05 ENCOUNTER — Other Ambulatory Visit: Payer: Self-pay | Admitting: Family

## 2023-07-05 VITALS — BP 126/70 | HR 65 | Temp 98.0°F | Resp 16 | Ht 67.0 in | Wt 162.4 lb

## 2023-07-05 DIAGNOSIS — D489 Neoplasm of uncertain behavior, unspecified: Secondary | ICD-10-CM

## 2023-07-05 NOTE — Progress Notes (Signed)
Acute Office Visit  Subjective:     Patient ID: Elijah Walker, male    DOB: 03-12-1951, 72 y.o.   MRN: [id number]  Chief Complaint  Patient presents with  . leg lesion    HPI Patient is in today with concerns of a lesion on the back of the left leg that has been present several months. He report scratching it and it bleeds. Has a history of skin cancer and wanted to have it checked.   Review of Systems  Constitutional: Negative.   Respiratory: Negative.    Cardiovascular: Negative.   Skin:        Bleeding, itchy skin lesion on the back of the left leg.   All other systems reviewed and are negative. Past Medical History:  Diagnosis Date  . Blood in stool    H/O  . History of chicken pox   . Hyperlipidemia   . PONV (postoperative nausea and vomiting)     Social History   Socioeconomic History  . Marital status: Married    Spouse name: Not on file  . Number of children: Not on file  . Years of education: Not on file  . Highest education level: Not on file  Occupational History  . Not on file  Tobacco Use  . Smoking status: Never  . Smokeless tobacco: Never  Vaping Use  . Vaping status: Never Used  Substance and Sexual Activity  . Alcohol use: No    Alcohol/week: 0.0 standard drinks of alcohol    Comment: occ  . Drug use: No  . Sexual activity: Yes  Other Topics Concern  . Not on file  Social History Narrative  . Not on file   Social Drivers of Health   Financial Resource Strain: Low Risk  (06/30/2022)   Overall Financial Resource Strain (CARDIA)   . Difficulty of Paying Living Expenses: Not hard at all  Food Insecurity: No Food Insecurity (06/30/2022)   Hunger Vital Sign   . Worried About Programme researcher, broadcasting/film/video in the Last Year: Never true   . Ran Out of Food in the Last Year: Never true  Transportation Needs: No Transportation Needs (06/30/2022)   PRAPARE - Transportation   . Lack of Transportation (Medical): No   . Lack of Transportation  (Non-Medical): No  Physical Activity: Insufficiently Active (06/30/2022)   Exercise Vital Sign   . Days of Exercise per Week: 3 days   . Minutes of Exercise per Session: 30 min  Stress: No Stress Concern Present (06/30/2022)   [employer] of Occupational Health - Occupational Stress Questionnaire   . Feeling of Stress : Not at all  Social Connections: Unknown (06/30/2022)   Social Connection and Isolation Panel [NHANES]   . Frequency of Communication with Friends and Family: Not on file   . Frequency of Social Gatherings with Friends and Family: Not on file   . Attends Religious Services: Not on file   . Active Member of Clubs or Organizations: Not on file   . Attends Banker Meetings: Not on file   . Marital Status: Married  Catering manager Violence: Not At Risk (06/30/2022)   Humiliation, Afraid, Rape, and Kick questionnaire   . Fear of Current or Ex-Partner: No   . Emotionally Abused: No   . Physically Abused: No   . Sexually Abused: No    Past Surgical History:  Procedure Laterality Date  . CHOLECYSTECTOMY  1998  . COLONOSCOPY    . SHOULDER ARTHROSCOPY WITH BANKART  REPAIR Left 09/14/2017   Procedure: SHOULDER ARTHROSCOPY WITH MINI OPEN ROTATOR CUFF REPAIR, SUBACROMINAL DECOMPRESSION, LABRAL REPAIR,BICEP TENOTOMY, DISTAL CLAVICLE EXCISION ;  Surgeon: Juanell Fairly, MD;  Location: [facility];  Service: Orthopedics;  Laterality: Left;  . SHOULDER ARTHROSCOPY WITH OPEN ROTATOR CUFF REPAIR AND DISTAL CLAVICLE ACROMINECTOMY Right 01/16/2021   Procedure: RIGHT SHOULDER ARTHROSCOPY WITH MINI-OPEN ROTATOR CUFF REPAIR, SUBACCROMINAL DECOMPRESSION AND DISTAL CLAVICLE EXCISION;  Surgeon: Juanell Fairly, MD;  Location: [facility];  Service: Orthopedics;  Laterality: Right;    Family History  Problem Relation Age of Onset  . Dementia Mother   . Diabetes Father   . Colitis Sister   . Autoimmune disease Brother   . Colitis Daughter     No Known Allergies  Current  Outpatient Medications on File Prior to Visit  Medication Sig Dispense Refill  . calcium-vitamin D (OSCAL WITH D) 500-5 MG-MCG tablet Take 1 tablet by mouth daily with breakfast.    . Evolocumab (REPATHA SURECLICK) 140 MG/ML SOAJ Inject 140 mg into the skin every 14 (fourteen) days. 6 mL 1  . Omega 3 1200 MG CAPS Take 6,000 mg by mouth daily.     No current facility-administered medications on file prior to visit.    BP 126/70   Pulse 65   Temp 98 F (36.7 C)   Resp 16   Ht 5\' 7"  (1.702 m)   Wt 162 lb 6.4 oz (73.7 kg)   SpO2 97%   BMI 25.44 kg/m chart      Objective:    BP 126/70   Pulse 65   Temp 98 F (36.7 C)   Resp 16   Ht 5\' 7"  (1.702 m)   Wt 162 lb 6.4 oz (73.7 kg)   SpO2 97%   BMI 25.44 kg/m    Physical Exam Vitals reviewed.  Constitutional:      Appearance: Normal appearance. He is normal weight.  Cardiovascular:     Rate and Rhythm: Normal rate and regular rhythm.  Pulmonary:     Effort: Pulmonary effort is normal.     Breath sounds: Normal breath sounds.  Skin:         Comments: Informed consent was given by the patient for a shave biopsy. The site was prepped with Betadine. 1cc of Lidocaine with epi was used to anesthetize the area. Using a 15 blade a 1 cm shave biopsy was obtained. The specimin was placed in preservative and sent for pathology. Hemostasis was achieved with a cartery. Wound care was discussed with the patient. The patient was informed that it would be one to 2 weeks before the pathology will be interpreted.   Neurological:     General: No focal deficit present.     Mental Status: He is alert and oriented to person, place, and time.   No results found for any visits on 07/05/23.      Assessment & Plan:   Problem List Items Addressed This Visit   None Visit Diagnoses       Neoplasm of uncertain behavior    -  Primary   Relevant Orders   Derm biopsy       No orders of the defined types were placed in this encounter.   No  follow-ups on file.  Eulis Foster, FNP

## 2023-07-05 NOTE — Telephone Encounter (Signed)
Pt being seen today

## 2023-07-08 LAB — DERMATOLOGY PATHOLOGY

## 2023-07-09 ENCOUNTER — Encounter: Payer: Self-pay | Admitting: Family

## 2023-07-12 ENCOUNTER — Ambulatory Visit (INDEPENDENT_AMBULATORY_CARE_PROVIDER_SITE_OTHER): Payer: PPO | Admitting: *Deleted

## 2023-07-12 VITALS — Ht 67.0 in | Wt 156.0 lb

## 2023-07-12 DIAGNOSIS — Z0000 Encounter for general adult medical examination without abnormal findings: Secondary | ICD-10-CM

## 2023-07-12 NOTE — Patient Instructions (Signed)
Elijah Walker , Thank you for taking time to come for your Medicare Wellness Visit. I appreciate your ongoing commitment to your health goals. Please review the following plan we discussed and let me know if I can assist you in the future.   Referrals/Orders/Follow-Ups/Clinician Recommendations: Remember to update your tetanus and shingles vaccines.  This is a list of the screening recommended for you and due dates:  Health Maintenance  Topic Date Due   DTaP/Tdap/Td vaccine (2 - Td or Tdap) 07/04/2018   Colon Cancer Screening  11/01/2023   COVID-19 Vaccine (6 - 2024-25 season) 07/21/2023*   Zoster (Shingles) Vaccine (1 of 2) 10/03/2023*   Medicare Annual Wellness Visit  07/11/2024   Pneumonia Vaccine  Completed   Flu Shot  Completed   Hepatitis C Screening  Completed   HPV Vaccine  Aged Out  *Topic was postponed. The date shown is not the original due date.    Advanced directives: (Copy Requested) Please bring a copy of your health care power of attorney and living will to the office to be added to your chart at your convenience.  Next Medicare Annual Wellness Visit scheduled for next year: Yes 07/17/24 @ 3:00

## 2023-07-12 NOTE — Progress Notes (Signed)
Subjective:   Elijah Walker is a 72 y.o. male who presents for Medicare Annual/Subsequent preventive examination.  Visit Complete: Virtual I connected with  [name] on 07/12/23 by a audio enabled telemedicine application and verified that I am speaking with the correct person using two identifiers.  Patient Location: Home  Provider Location: Office/Clinic  I discussed the limitations of evaluation and management by telemedicine. The patient expressed understanding and agreed to proceed.  Vital Signs: Because this visit was a virtual/telehealth visit, some criteria may be missing or patient reported. Any vitals not documented were not able to be obtained and vitals that have been documented are patient reported.   Cardiac Risk Factors include: advanced age (>63men, >53 women);male gender;dyslipidemia     Objective:    Today's Vitals   07/12/23 1401  Weight: 156 lb (70.8 kg)  Height: 5\' 7"  (1.702 m)   Body mass index is 24.43 kg/m.     07/12/2023    2:15 PM 06/30/2022    9:13 AM 05/26/2021    8:36 AM 01/16/2021   11:03 AM 01/06/2021    9:50 AM 05/23/2020    8:46 AM 05/23/2019    8:44 AM  Advanced Directives  Does Patient Have a Medical Advance Directive? Yes No Yes No No Yes No  Type of Estate agent of [place];Living will  Healthcare Power of [place];Living will   Healthcare Power of [place];Living will   Does patient want to make changes to medical advance directive?   No - Patient declined   No - Patient declined   Copy of Healthcare Power of Attorney in Chart? No - copy requested  No - copy requested   No - copy requested   Would patient like information on creating a medical advance directive?  No - Patient declined  No - Patient declined No - Patient declined  No - Patient declined    Current Medications (verified) Outpatient Encounter Medications as of 07/12/2023  Medication Sig   calcium-vitamin D (OSCAL WITH D) 500-5 MG-MCG tablet  Take 1 tablet by mouth daily with breakfast.   Cholecalciferol (VITAMIN D) 50 MCG (2000 UT) CAPS Take by mouth daily.   Evolocumab (REPATHA SURECLICK) 140 MG/ML SOAJ Inject 140 mg into the skin every 14 (fourteen) days.   Omega 3 1200 MG CAPS Take 8,000 mg by mouth daily.   No facility-administered encounter medications on file as of 07/12/2023.    Allergies (verified) Patient has no known allergies.   History: Past Medical History:  Diagnosis Date   Blood in stool    H/O   History of chicken pox    Hyperlipidemia    PONV (postoperative nausea and vomiting)    Past Surgical History:  Procedure Laterality Date   CHOLECYSTECTOMY  1998   COLONOSCOPY     SHOULDER ARTHROSCOPY WITH BANKART REPAIR Left 09/14/2017   Procedure: SHOULDER ARTHROSCOPY WITH MINI OPEN ROTATOR CUFF REPAIR, SUBACROMINAL DECOMPRESSION, LABRAL REPAIR,BICEP TENOTOMY, DISTAL CLAVICLE EXCISION ;  Surgeon: Juanell Fairly, MD;  Location: [facility];  Service: Orthopedics;  Laterality: Left;   SHOULDER ARTHROSCOPY WITH OPEN ROTATOR CUFF REPAIR AND DISTAL CLAVICLE ACROMINECTOMY Right 01/16/2021   Procedure: RIGHT SHOULDER ARTHROSCOPY WITH MINI-OPEN ROTATOR CUFF REPAIR, SUBACCROMINAL DECOMPRESSION AND DISTAL CLAVICLE EXCISION;  Surgeon: Juanell Fairly, MD;  Location: [facility];  Service: Orthopedics;  Laterality: Right;   skin lesion on leg Left 06/2023   Family History  Problem Relation Age of Onset   Dementia Mother    Diabetes Father  Colitis Sister    Autoimmune disease Brother    Colitis Daughter    Social History   Socioeconomic History   Marital status: Married    Spouse name: Not on file   Number of children: Not on file   Years of education: Not on file   Highest education level: Not on file  Occupational History   Not on file  Tobacco Use   Smoking status: Never   Smokeless tobacco: Never  Vaping Use   Vaping status: Never Used  Substance and Sexual Activity   Alcohol use: No     Alcohol/week: 0.0 standard drinks of alcohol    Comment: occ   Drug use: No   Sexual activity: Yes  Other Topics Concern   Not on file  Social History Narrative   Not on file   Social Drivers of Health   Financial Resource Strain: Low Risk  (07/12/2023)   Overall Financial Resource Strain (CARDIA)    Difficulty of Paying Living Expenses: Not hard at all  Food Insecurity: No Food Insecurity (07/12/2023)   Hunger Vital Sign    Worried About Running Out of Food in the Last Year: Never true    Ran Out of Food in the Last Year: Never true  Transportation Needs: No Transportation Needs (07/12/2023)   PRAPARE - Administrator, Civil Service (Medical): No    Lack of Transportation (Non-Medical): No  Physical Activity: Sufficiently Active (07/12/2023)   Exercise Vital Sign    Days of Exercise per Week: 6 days    Minutes of Exercise per Session: 40 min  Stress: No Stress Concern Present (07/12/2023)   [employer] of Occupational Health - Occupational Stress Questionnaire    Feeling of Stress : Not at all  Social Connections: Socially Integrated (07/12/2023)   Social Connection and Isolation Panel [NHANES]    Frequency of Communication with Friends and Family: More than three times a week    Frequency of Social Gatherings with Friends and Family: More than three times a week    Attends Religious Services: More than 4 times per year    Active Member of [employer] or Organizations: Yes    Attends Engineer, structural: More than 4 times per year    Marital Status: Married    Tobacco Counseling Counseling given: Not Answered   Clinical Intake:  Pre-visit preparation completed: Yes  Pain : No/denies pain     BMI - recorded: 24.43 Nutritional Status: BMI of 19-24  Normal Nutritional Risks: None Diabetes: No  How often do you need to have someone help you when you read instructions, pamphlets, or other written materials from your doctor or pharmacy?: 1 -  Never  Interpreter Needed?: No  Information entered by :: R. Watson Robarge LPN   Activities of Daily Living    07/12/2023    2:03 PM  In your present state of health, do you have any difficulty performing the following activities:  Hearing? 0  Vision? 0  Comment glasses  Difficulty concentrating or making decisions? 0  Walking or climbing stairs? 0  Dressing or bathing? 0  Doing errands, shopping? 0  Preparing Food and eating ? N  Using the Toilet? N  In the past six months, have you accidently leaked urine? Y  Do you have problems with loss of bowel control? N  Managing your Medications? N  Managing your Finances? N  Housekeeping or managing your Housekeeping? N    Patient Care Team: Dale Mif[place], MD  as PCP - General (Internal Medicine) Debbe Odea, MD as PCP - Cardiology (Cardiology)  Indicate any recent Medical Services you may have received from other than [facility] providers in the past year (date may be approximate).     Assessment:   This is a routine wellness examination for [place].  Hearing/Vision screen Hearing Screening - Comments:: No issues Vision Screening - Comments:: glasses   Goals Addressed             This Visit's Progress    Patient Stated       Wants to continue to exercise        Depression Screen    07/12/2023    2:10 PM 08/18/2022    8:51 AM 06/30/2022    8:59 AM 04/10/2022    2:36 PM 01/06/2022   11:22 AM 12/23/2021   11:05 AM 12/02/2021    9:35 AM  PHQ 2/9 Scores  PHQ - 2 Score 0 0 0 0 0 0 0  PHQ- 9 Score 0          Fall Risk    07/12/2023    2:05 PM 08/18/2022    8:51 AM 06/30/2022    8:59 AM 04/10/2022    2:36 PM 01/06/2022   11:22 AM  Fall Risk   Falls in the past year? 0 0 0 0 0  Number falls in past yr: 0 0 0    Injury with Fall? 0 0 0    Risk for fall due to : No Fall Risks No Fall Risks No Fall Risks No Fall Risks No Fall Risks  Follow up Falls prevention discussed;Falls evaluation completed Falls evaluation  completed Falls evaluation completed Falls evaluation completed Falls evaluation completed    MEDICARE RISK AT HOME: Medicare Risk at Home Any stairs in or around the home?: Yes If so, are there any without handrails?: No Home free of loose throw rugs in walkways, pet beds, electrical cords, etc?: No Adequate lighting in your home to reduce risk of falls?: Yes Life alert?: No Use of a cane, walker or w/c?: No Grab bars in the bathroom?: Yes Shower chair or bench in shower?: No Elevated toilet seat or a handicapped toilet?: No  Cognitive Function:    05/17/2018    9:52 AM  MMSE - Mini Mental State Exam  Orientation to time 5  Orientation to Place 5  Registration 3  Attention/ Calculation 5  Recall 3  Language- name 2 objects 2  Language- repeat 1  Language- follow 3 step command 3  Language- read & follow direction 1  Write a sentence 1  Copy design 1  Total score 30        07/12/2023    2:15 PM 06/30/2022    9:25 AM 05/26/2021    8:42 AM 05/23/2019    8:58 AM  6CIT Screen  What Year? 0 points 0 points  0 points  What month? 0 points 0 points  0 points  What time? 0 points 0 points  0 points  Count back from 20 0 points 0 points  0 points  Months in reverse 0 points 0 points 0 points 0 points  Repeat phrase 2 points 0 points  0 points  Total Score 2 points 0 points  0 points    Immunizations Immunization History  Administered Date(s) Administered   Fluad Quad(high Dose 65+) 03/21/2019, 04/12/2022   Influenza Whole 06/22/2016   Influenza, High Dose Seasonal PF 04/08/2020   Influenza,inj,Quad PF,6+ Mos 05/08/2014,  05/22/2015   Influenza-Unspecified 04/05/2013, 03/18/2017, 04/26/2018, 04/21/2021, 03/15/2023   PFIZER(Purple Top)SARS-COV-2 Vaccination 08/27/2019, 09/20/2019, 05/09/2020   PPD Test 08/29/2014   Pfizer Covid-19 Vaccine Bivalent Booster 57yrs & up 03/21/2021, 04/18/2022   Pneumococcal Conjugate-13 09/01/2016   Pneumococcal Polysaccharide-23  03/24/2018   Respiratory Syncytial Virus Vaccine,Recomb Aduvanted(Arexvy) 07/21/2022   Tdap 07/04/2008   Zoster, Live 01/23/2011    TDAP status: Due, Education has been provided regarding the importance of this vaccine. Advised may receive this vaccine at local pharmacy or Health Dept. Aware to provide a copy of the vaccination record if obtained from local pharmacy or Health Dept. Verbalized acceptance and understanding.  Flu Vaccine status: Up to date  Pneumococcal vaccine status: Up to date  Covid-19 vaccine status: Completed vaccines  Qualifies for Shingles Vaccine? Yes   Zostavax completed Yes   Shingrix Completed?: No.    Education has been provided regarding the importance of this vaccine. Patient has been advised to call insurance company to determine out of pocket expense if they have not yet received this vaccine. Advised may also receive vaccine at local pharmacy or Health Dept. Verbalized acceptance and understanding.  Screening Tests Health Maintenance  Topic Date Due   DTaP/Tdap/Td (2 - Td or Tdap) 07/04/2018   Medicare Annual Wellness (AWV)  07/01/2023   Colonoscopy  11/01/2023   COVID-19 Vaccine (6 - 2024-25 season) 07/21/2023 (Originally 03/14/2023)   Zoster Vaccines- Shingrix (1 of 2) 10/03/2023 (Originally 07/07/2001)   Pneumonia Vaccine 47+ Years old  Completed   INFLUENZA VACCINE  Completed   Hepatitis C Screening  Completed   HPV VACCINES  Aged Out    Health Maintenance  Health Maintenance Due  Topic Date Due   DTaP/Tdap/Td (2 - Td or Tdap) 07/04/2018   Medicare Annual Wellness (AWV)  07/01/2023   Colonoscopy  11/01/2023    Colorectal cancer screening: Type of screening: Colonoscopy. Completed 10/2013. Repeat every 10 years Will discuss with PCP at next visit  Lung Cancer Screening: (Low Dose CT Chest recommended if Age 65-80 years, 20 pack-year currently smoking OR have quit w/in 15years.) does not qualify.     Additional Screening:  Hepatitis C  Screening: does qualify; Completed 05/2018  Vision Screening: Recommended annual ophthalmology exams for early detection of glaucoma and other disorders of the eye. Is the patient up to date with their annual eye exam?  Yes  Who is the provider or what is the name of the office in which the patient attends annual eye exams? [facility] If pt is not established with a provider, would they like to be referred to a provider to establish care? No .   Dental Screening: Recommended annual dental exams for proper oral hygiene    Community Resource Referral / Chronic Care Management: CRR required this visit?  No   CCM required this visit?  No     Plan:     I have personally reviewed and noted the following in the patient's chart:   Medical and social history Use of alcohol, tobacco or illicit drugs  Current medications and supplements including opioid prescriptions. Patient is not currently taking opioid prescriptions. Functional ability and status Nutritional status Physical activity Advanced directives List of other physicians Hospitalizations, surgeries, and ER visits in previous 12 months Vitals Screenings to include cognitive, depression, and falls Referrals and appointments  In addition, I have reviewed and discussed with patient certain preventive protocols, quality metrics, and best practice recommendations. A written personalized care plan for preventive services as well  as general preventive health recommendations were provided to patient.     Sydell Axon, LPN   19/14/7829   After Visit Summary: (MyChart) Due to this being a telephonic visit, the after visit summary with patients personalized plan was offered to patient via MyChart   Nurse Notes: None

## 2023-07-19 DIAGNOSIS — H2513 Age-related nuclear cataract, bilateral: Secondary | ICD-10-CM | POA: Diagnosis not present

## 2023-07-26 ENCOUNTER — Ambulatory Visit: Payer: Self-pay | Admitting: Internal Medicine

## 2023-07-26 ENCOUNTER — Ambulatory Visit (INDEPENDENT_AMBULATORY_CARE_PROVIDER_SITE_OTHER): Payer: PPO | Admitting: Family

## 2023-07-26 ENCOUNTER — Telehealth: Payer: Self-pay

## 2023-07-26 ENCOUNTER — Encounter: Payer: Self-pay | Admitting: Family

## 2023-07-26 ENCOUNTER — Ambulatory Visit: Payer: PPO | Admitting: Family

## 2023-07-26 VITALS — BP 130/70 | HR 62 | Temp 97.5°F | Ht 68.0 in | Wt 158.0 lb

## 2023-07-26 DIAGNOSIS — S61210A Laceration without foreign body of right index finger without damage to nail, initial encounter: Secondary | ICD-10-CM | POA: Insufficient documentation

## 2023-07-26 DIAGNOSIS — Z23 Encounter for immunization: Secondary | ICD-10-CM

## 2023-07-26 NOTE — Telephone Encounter (Signed)
 noted

## 2023-07-26 NOTE — Progress Notes (Signed)
 Assessment & Plan:  Laceration of right index finger without foreign body without damage to nail, initial encounter Assessment & Plan: Laceration from mandolin 4 days ago.  Wound appears well-perfused without infection at this time.   Cleaned with sterile saline and applied 2 x 2 with Kerlix.  Encouraged use of Vaseline and keeping area covered not overly saturated.  He will stay vigilant and let me know of any concerns in regards to perfusion or infection. Tdap provided.   Orders: -     Tdap vaccine greater than or equal to 7yo IM     Return precautions given.   Risks, benefits, and alternatives of the medications and treatment plan prescribed today were discussed, and patient expressed understanding.   Education regarding symptom management and diagnosis given to patient on AVS either electronically or printed.  No follow-ups on file.  Rollene Northern, FNP  Subjective:    Patient ID: Elijah Walker, male    DOB: 1951/05/03, 73 y.o.   MRN: [id number]  CC: Elijah Walker is a 73 y.o. male who presents today for an acute visit.    HPI: Complains of right 2nd finger laceration x 3 days ago with potato slicer  He applied direct pressure to stop the bleeding.  He has been wearing a Band-Aid throughout the weekend  He has not removed the bandage until this morning while in the office .   Tdap 2009      Allergies: Patient has no known allergies. Current Outpatient Medications on File Prior to Visit  Medication Sig Dispense Refill   calcium -vitamin D (OSCAL WITH D) 500-5 MG-MCG tablet Take 1 tablet by mouth daily with breakfast.     Cholecalciferol (VITAMIN D) 50 MCG (2000 UT) CAPS Take by mouth daily.     Evolocumab  (REPATHA  SURECLICK) 140 MG/ML SOAJ Inject 140 mg into the skin every 14 (fourteen) days. 6 mL 1   Omega 3 1200 MG CAPS Take 8,000 mg by mouth daily.     No current facility-administered medications on file prior to visit.    Review of Systems  Constitutional:   Negative for chills and fever.  Respiratory:  Negative for cough.   Cardiovascular:  Negative for chest pain and palpitations.  Gastrointestinal:  Negative for nausea and vomiting.  Skin:  Positive for wound.      Objective:    BP 130/70   Pulse 62   Temp (!) 97.5 F (36.4 C) (Oral)   Ht 5' 8 (1.727 m)   Wt 158 lb (71.7 kg)   SpO2 97%   BMI 24.02 kg/m   BP Readings from Last 3 Encounters:  07/26/23 130/70  07/05/23 126/70  02/25/23 130/60   Wt Readings from Last 3 Encounters:  07/26/23 158 lb (71.7 kg)  07/12/23 156 lb (70.8 kg)  07/05/23 162 lb 6.4 oz (73.7 kg)    Physical Exam Vitals reviewed.  Constitutional:      Appearance: He is well-developed.  Cardiovascular:     Rate and Rhythm: Regular rhythm.     Heart sounds: Normal heart sounds.  Pulmonary:     Effort: Pulmonary effort is normal. No respiratory distress.     Breath sounds: Normal breath sounds. No wheezing, rhonchi or rales.  Skin:    General: Skin is warm and dry.     Comments: 1 cm U-shaped laceration.  Nailbed intact . well-approximated without purulent discharge.  Tender with palpation.  Sensation intact.  Palpable radial pulse.  Neurological:     Mental  Status: He is alert.  Psychiatric:        Speech: Speech normal.        Behavior: Behavior normal.

## 2023-07-26 NOTE — Telephone Encounter (Signed)
 Appt reschedule we do not do stiches in office

## 2023-07-26 NOTE — Assessment & Plan Note (Addendum)
 Laceration from mandolin 4 days ago.  Wound appears well-perfused without infection at this time.   Cleaned with sterile saline and applied 2 x 2 with Kerlix.  Encouraged use of Vaseline and keeping area covered not overly saturated.  He will stay vigilant and let me know of any concerns in regards to perfusion or infection. Tdap provided.

## 2023-07-26 NOTE — Telephone Encounter (Signed)
 Copied from CRM [id number]. Topic: Clinical - Red Word Triage >> Jul 26, 2023  8:31 AM Thersia BROCKS wrote: [facility] that prompted transfer to Nurse Triage: Patient cut his finger, stated he put about 8 small bandage wrapped it around the tip of the finger to press it back together, but wants to be seen in case he needs stitches  Chief Complaint: Laceration on finger Symptoms: Swelling Frequency: 36 hours ago Pertinent Negatives: Patient denies bleeding at this time Disposition: [] ED /[] Urgent Care (no appt availability in office) / [x] Appointment(In office/virtual)/ []  [place] [facility]/ [] Home Care/ [] Refused Recommended Disposition /[] [place] [facility]/ []  Follow-up with PCP Additional Notes: Patient called in to report a laceration on his right index finger. Patient stated he was cutting a potato about 36 hours ago and sliced his finger. Patient reported the cut is on the top and bottom of his finger, extending from the finger tip to the knuckle. Patient stated that the cut produced a skin flap the size of a dime. Patient stated that finger was bleeding profusely at the time, but bleeding has stopped. Patient stated he used 8 Band-Aids to dress the finger. Patient is concerned that he may need stitches. Patient stated that it was a clean cut, but that his tetanus shot may not be up to date. Advised patient to be seen in office. Scheduled patient for this morning. Advised patient to keep wound dressed until appointment.   Reason for Disposition  [1] Last tetanus shot > 10 years ago AND [2] CLEAN cut  Answer Assessment - Initial Assessment Questions 1. APPEARANCE of INJURY: What does the injury look like?      Patient states finger bled profusely when it happened, skin flap the size of a dime, swelling and throbbing  2. SIZE: How large is the cut?      Patient states finger is cut on the top and the bottom, from finger tip to knuckle  3. BLEEDING: Is it bleeding now? If Yes, ask: Is  it difficult to stop?      Patient states bleeding has stopped, but he had to apply 8 bandaids  4. LOCATION: Where is the injury located?      Right index finger  5. ONSET: How long ago did the injury occur?      36 hours ago  6. 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

## 2023-07-26 NOTE — Telephone Encounter (Signed)
 I left a voicemail for patient letting him know that we are unable to do stitches here, so he would need to go to an Urgent Care if he needs stitches. (Per KD)

## 2023-07-28 NOTE — Patient Instructions (Signed)
 Keep finger dry and covered.  You may use Vaseline.  Do not overly saturate.  Please let me know if concerns of infection.

## 2023-08-13 ENCOUNTER — Encounter: Payer: Self-pay | Admitting: Internal Medicine

## 2023-08-16 ENCOUNTER — Other Ambulatory Visit: Payer: Self-pay

## 2023-08-16 DIAGNOSIS — E7800 Pure hypercholesterolemia, unspecified: Secondary | ICD-10-CM

## 2023-08-16 MED ORDER — REPATHA SURECLICK 140 MG/ML ~~LOC~~ SOAJ: 140.0000 mg | SUBCUTANEOUS | 1 refills | Status: DC

## 2023-08-23 ENCOUNTER — Telehealth (HOSPITAL_COMMUNITY): Payer: Self-pay

## 2023-08-23 NOTE — Telephone Encounter (Signed)
 Pharmacy Patient Advocate Encounter   Received notification from CoverMyMeds that prior authorization for Repatha  Sureclick 140 mg/ml  is required/requested.   Insurance verification completed.   The patient is insured through [employer] Electronic Prior Authorization Form 2017 NCPDP .   Per test claim: PA required; PA started via CoverMyMeds. KEY [id number] . Waiting for clinical questions to populate.

## 2023-08-25 ENCOUNTER — Other Ambulatory Visit (HOSPITAL_COMMUNITY): Payer: Self-pay

## 2023-08-25 NOTE — Telephone Encounter (Signed)
Clinical questions answered and PA submitted.

## 2023-08-26 ENCOUNTER — Telehealth: Payer: Self-pay

## 2023-08-26 NOTE — Telephone Encounter (Signed)
Copied from CRM [id number]. Topic: Clinical - Prescription Issue >> Aug 26, 2023  8:55 AM Elizebeth Brooking wrote: Reason for CRM: [employer] called in wanting to speak with someone regarding patients prior auth request for Repatha Sureclick 140 mg/ml  , have some questions regarding it. Is asking for a callback at [phone] option 2

## 2023-08-26 NOTE — Telephone Encounter (Signed)
Placed call to the insurance and provided additional information regarding the prior authorization.   Per the representative we should receive a determination response by 08/28/23.

## 2023-08-26 NOTE — Telephone Encounter (Signed)
Sent to PA team.

## 2023-08-27 ENCOUNTER — Other Ambulatory Visit (HOSPITAL_COMMUNITY): Payer: Self-pay

## 2023-08-27 NOTE — Telephone Encounter (Signed)
See phone note from pharmacy team

## 2023-08-27 NOTE — Telephone Encounter (Signed)
Pharmacy Patient Advocate Encounter  Received notification from [facility] [employer] that Prior Authorization for Repatha SureClick 140mg /ml has been APPROVED from 08/26/23 to 08/25/24. Ran test claim, Copay is $117.50. This test claim was processed through [facility]- copay amounts may vary at other pharmacies due to pharmacy/plan contracts, or as the patient moves through the different stages of their insurance plan.   PA #/Case ID/Reference #: [id number]  Approval letter indexed to media tab

## 2023-08-27 NOTE — Telephone Encounter (Signed)
Pt aware. See note from pharmacy team

## 2023-10-15 ENCOUNTER — Encounter: Payer: Self-pay | Admitting: Cardiology

## 2023-10-15 ENCOUNTER — Ambulatory Visit: Payer: PPO | Attending: Cardiology | Admitting: Cardiology

## 2023-10-15 VITALS — BP 120/66 | HR 65 | Resp 17 | Ht 68.0 in | Wt 157.0 lb

## 2023-10-15 DIAGNOSIS — I2510 Atherosclerotic heart disease of native coronary artery without angina pectoris: Secondary | ICD-10-CM | POA: Diagnosis not present

## 2023-10-15 DIAGNOSIS — E7800 Pure hypercholesterolemia, unspecified: Secondary | ICD-10-CM

## 2023-10-15 NOTE — Progress Notes (Signed)
 Cardiology Office Note:    Date:  10/15/2023   ID:  Elijah Walker, DOB 24-Jan-1951, MRN [id number]  PCP:  Dale [place], MD   [facility] Providers Cardiologist:  Debbe Odea, MD     Referring MD: Dale [place], MD   Chief Complaint  Patient presents with   Coronary artery disease involving native heart without angi   Follow-up    12 month     History of Present Illness:    Elijah Walker is a 73 y.o. 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   He denies chest pain or shortness of breath.  Exercises by running about 3 miles daily with no symptoms.  Compliant with Repatha as prescribed.  Feels well, has no concerns at this time.  Prior notes/studies Echo 01/2021 EF 60 to 65% Coronary CTA 12/2021 calcium score 299, moderate proximal LCx, mild proximal LAD.  Past Medical History:  Diagnosis Date   Blood in stool    H/O   History of chicken pox    Hyperlipidemia    PONV (postoperative nausea and vomiting)     Past Surgical History:  Procedure Laterality Date   CHOLECYSTECTOMY  1998   COLONOSCOPY     SHOULDER ARTHROSCOPY WITH BANKART REPAIR Left 09/14/2017   Procedure: SHOULDER ARTHROSCOPY WITH MINI OPEN ROTATOR CUFF REPAIR, SUBACROMINAL DECOMPRESSION, LABRAL REPAIR,BICEP TENOTOMY, DISTAL CLAVICLE EXCISION ;  Surgeon: Juanell Fairly, MD;  Location: [facility] ORS;  Service: Orthopedics;  Laterality: Left;   SHOULDER ARTHROSCOPY WITH OPEN ROTATOR CUFF REPAIR AND DISTAL CLAVICLE ACROMINECTOMY Right 01/16/2021   Procedure: RIGHT SHOULDER ARTHROSCOPY WITH MINI-OPEN ROTATOR CUFF REPAIR, SUBACCROMINAL DECOMPRESSION AND DISTAL CLAVICLE EXCISION;  Surgeon: Juanell Fairly, MD;  Location: [facility] ORS;  Service: Orthopedics;  Laterality: Right;   skin lesion on leg Left 06/2023    Current Medications: Current Meds  Medication Sig   Cholecalciferol (VITAMIN D) 50 MCG (2000 UT) CAPS Take by  mouth daily.   Evolocumab (REPATHA SURECLICK) 140 MG/ML SOAJ Inject 140 mg into the skin every 14 (fourteen) days.   Omega 3 1200 MG CAPS Take 8,000 mg by mouth daily.     Allergies:   Patient has no known allergies.   Social History   Socioeconomic History   Marital status: Married    Spouse name: Not on file   Number of children: 2   Years of education: Not on file   Highest education level: Not on file  Occupational History   Not on file  Tobacco Use   Smoking status: Never   Smokeless tobacco: Never  Vaping Use   Vaping status: Never Used  Substance and Sexual Activity   Alcohol use: No    Alcohol/week: 0.0 standard drinks of alcohol    Comment: occ   Drug use: No   Sexual activity: Yes  Other Topics Concern   Not on file  Social History Narrative   Not on file   Social Drivers of Health   Financial Resource Strain: Low Risk  (07/12/2023)   Overall Financial Resource Strain (CARDIA)    Difficulty of Paying Living Expenses: Not hard at all  Food Insecurity: No Food Insecurity (07/12/2023)   Hunger Vital Sign    Worried About Running Out of Food in the Last Year: Never true    Ran Out of Food in the Last Year: Never true  Transportation Needs: No Transportation Needs (07/12/2023)   PRAPARE - Transportation    Lack  of Transportation (Medical): No    Lack of Transportation (Non-Medical): No  Physical Activity: Sufficiently Active (07/12/2023)   Exercise Vital Sign    Days of Exercise per Week: 6 days    Minutes of Exercise per Session: 40 min  Stress: No Stress Concern Present (07/12/2023)   [employer] of Occupational Health - Occupational Stress Questionnaire    Feeling of Stress : Not at all  Social Connections: Socially Integrated (07/12/2023)   Social Connection and Isolation Panel [NHANES]    Frequency of Communication with Friends and Family: More than three times a week    Frequency of Social Gatherings with Friends and Family: More than three  times a week    Attends Religious Services: More than 4 times per year    Active Member of [employer] or Organizations: Yes    Attends Engineer, structural: More than 4 times per year    Marital Status: Married     Family History: The patient's family history includes Autoimmune disease in his brother; Colitis in his daughter and sister; Dementia in his mother; Diabetes in his father.  ROS:   Please see the history of present illness.     All other systems reviewed and are negative.  EKGs/Labs/Other Studies Reviewed:    The following studies were reviewed today:   EKG Interpretation Date/Time:  Friday October 15 2023 10:28:05 EDT Ventricular Rate:  64 PR Interval:  168 QRS Duration:  80 QT Interval:  418 QTC Calculation: 431 R Axis:   -8  Text Interpretation: Normal sinus rhythm Possible Anterior infarct , age undetermined Confirmed by Debbe Odea ([id number]) on 10/15/2023 10:38:48 AM    Recent Labs: 10/15/2022: Hemoglobin 15.6; Platelets 212.0; TSH 2.18 06/03/2023: ALT 13; BUN 13; Creatinine, Ser 1.14; Sodium 140 06/18/2023: Potassium 4.5  Recent Lipid Panel    Component Value Date/Time   CHOL 171 06/03/2023 1029   TRIG 137.0 06/03/2023 1029   HDL 47.50 06/03/2023 1029   CHOLHDL 4 06/03/2023 1029   VLDL 27.4 06/03/2023 1029   LDLCALC 96 06/03/2023 1029     Risk Assessment/Calculations:         Physical Exam:    VS:  BP 120/66 (BP Location: Left Arm, Patient Position: Sitting, Cuff Size: Normal)   Pulse 65   Resp 17   Ht 5\' 8"  (1.727 m)   Wt 157 lb (71.2 kg)   SpO2 98%   BMI 23.87 kg/m     Wt Readings from Last 3 Encounters:  10/15/23 157 lb (71.2 kg)  07/26/23 158 lb (71.7 kg)  07/12/23 156 lb (70.8 kg)     GEN:  Well nourished, well developed in no acute distress HEENT: Normal NECK: No JVD; No carotid bruits CARDIAC: RRR, no murmurs, rubs, gallops RESPIRATORY:  Clear to auscultation without rales, wheezing or rhonchi  ABDOMEN: Soft,  non-tender, non-distended MUSCULOSKELETAL:  No edema; No deformity  SKIN: Warm and dry NEUROLOGIC:  Alert and oriented x 3 PSYCHIATRIC:  Normal affect   ASSESSMENT:    1. Coronary artery disease involving native heart without angina pectoris, unspecified vessel or lesion type   2. Pure hypercholesterolemia    PLAN:    In order of problems listed above:  Moderate proximal left circumflex stenosis 50%, mild LAD disease.  Calcium score 299.  Continue Repatha.  LDL at goal.  Not on aspirin due to prior renal injury while on aspirin. Hyperlipidemia, cholesterol controlled.  Continue Repatha.  Follow-up yearly.       Medication  Adjustments/Labs and Tests Ordered: Current medicines are reviewed at length with the patient today.  Concerns regarding medicines are outlined above.  Orders Placed This Encounter  Procedures   EKG 12-Lead   No orders of the defined types were placed in this encounter.   Patient Instructions  Medication Instructions:  No changes at this time.   *If you need a refill on your cardiac medications before your next appointment, please call your pharmacy*  Lab Work: None  If you have labs (blood work) drawn today and your tests are completely normal, you will receive your results only by: MyChart Message (if you have MyChart) OR A paper copy in the mail If you have any lab test that is abnormal or we need to change your treatment, we will call you to review the results.  Testing/Procedures: None  Follow-Up: At [facility], you and your health needs are our priority.  As part of our continuing mission to provide you with exceptional heart care, our providers are all part of one team.  This team includes your primary Cardiologist (physician) and Advanced Practice Providers or APPs (Physician Assistants and Nurse Practitioners) who all work together to provide you with the care you need, when you need it.  Your next appointment:   1  year(s)  Provider:   Debbe Odea, MD          Signed, Debbe Odea, MD  10/15/2023 11:26 AM    [facility]

## 2023-10-15 NOTE — Patient Instructions (Signed)
 Medication Instructions:  No changes at this time.   *If you need a refill on your cardiac medications before your next appointment, please call your pharmacy*  Lab Work: None  If you have labs (blood work) drawn today and your tests are completely normal, you will receive your results only by: MyChart Message (if you have MyChart) OR A paper copy in the mail If you have any lab test that is abnormal or we need to change your treatment, we will call you to review the results.  Testing/Procedures: None   Follow-Up: At [facility], you and your health needs are our priority.  As part of our continuing mission to provide you with exceptional heart care, our providers are all part of one team.  This team includes your primary Cardiologist (physician) and Advanced Practice Providers or APPs (Physician Assistants and Nurse Practitioners) who all work together to provide you with the care you need, when you need it.  Your next appointment:   1 year(s)  Provider:   Debbe Odea, MD

## 2023-11-03 ENCOUNTER — Other Ambulatory Visit: Payer: Self-pay | Admitting: Internal Medicine

## 2023-11-03 DIAGNOSIS — E7800 Pure hypercholesterolemia, unspecified: Secondary | ICD-10-CM

## 2023-11-03 MED ORDER — REPATHA SURECLICK 140 MG/ML ~~LOC~~ SOAJ: 140.0000 mg | SUBCUTANEOUS | 1 refills | Status: DC

## 2023-11-03 NOTE — Telephone Encounter (Signed)
 Copied from CRM [id number]. Topic: Clinical - Medication Refill >> Nov 03, 2023  8:57 AM Dimple Francis wrote: Most Recent Primary Care Visit:  Provider: Calista Catching  Department: [facility]  Visit Type: OFFICE VISIT  Date: 07/26/2023  Medication: Evolocumab  (REPATHA  SURECLICK) 140 MG/ML SOAJ  Has the patient contacted their pharmacy? Yes (Agent: If no, request that the patient contact the pharmacy for the refill. If patient does not wish to contact the pharmacy document the reason why and proceed with request.) (Agent: If yes, when and what did the pharmacy advise?)  Is this the correct pharmacy for this prescription? Yes If no, delete pharmacy and type the correct one.  This is the patient's preferred pharmacy:  [phone] [facility] [place] - [address] [place] [address] Phone: [phone] Fax: [phone]   Has the prescription been filled recently? Yes  Is the patient out of the medication? Yes  Has the patient been seen for an appointment in the last year OR does the patient have an upcoming appointment? Yes  Can we respond through MyChart? Yes  Agent: Please be advised that Rx refills may take up to 3 business days. We ask that you follow-up with your pharmacy.

## 2024-02-11 LAB — FECAL OCCULT BLOOD, IMMUNOCHEMICAL: IFOBT: NEGATIVE

## 2024-02-18 ENCOUNTER — Encounter: Payer: Self-pay | Admitting: Internal Medicine

## 2024-02-18 DIAGNOSIS — E7800 Pure hypercholesterolemia, unspecified: Secondary | ICD-10-CM

## 2024-02-18 DIAGNOSIS — R739 Hyperglycemia, unspecified: Secondary | ICD-10-CM

## 2024-02-18 NOTE — Telephone Encounter (Signed)
 I have placed the order for the labs.  Please schedule lab appt before he leaves. Thanks.

## 2024-02-18 NOTE — Telephone Encounter (Signed)
 Please confirm it was cologuard and abstract.

## 2024-02-21 ENCOUNTER — Other Ambulatory Visit (INDEPENDENT_AMBULATORY_CARE_PROVIDER_SITE_OTHER)

## 2024-02-21 DIAGNOSIS — E7800 Pure hypercholesterolemia, unspecified: Secondary | ICD-10-CM | POA: Diagnosis not present

## 2024-02-21 DIAGNOSIS — R739 Hyperglycemia, unspecified: Secondary | ICD-10-CM

## 2024-02-21 LAB — BASIC METABOLIC PANEL WITH GFR
BUN: 17 mg/dL (ref 6–23)
CO2: 27 meq/L (ref 19–32)
Calcium: 9.1 mg/dL (ref 8.4–10.5)
Chloride: 104 meq/L (ref 96–112)
Creatinine, Ser: 1.15 mg/dL (ref 0.40–1.50)
GFR: 63.53 mL/min (ref >60.00)
Glucose, Bld: 110 mg/dL — ABNORMAL HIGH (ref 70–99)
Potassium: 3.8 meq/L (ref 3.5–5.1)
Sodium: 139 meq/L (ref 135–145)

## 2024-02-21 LAB — LIPID PANEL
Cholesterol: 137 mg/dL (ref 0–200)
HDL: 48 mg/dL (ref >39.00)
LDL Cholesterol: 68 mg/dL (ref 0–99)
NonHDL: 89.11
Total CHOL/HDL Ratio: 3
Triglycerides: 107 mg/dL (ref 0.0–149.0)
VLDL: 21.4 mg/dL (ref 0.0–40.0)

## 2024-02-21 LAB — CBC WITH DIFFERENTIAL/PLATELET
Basophils Absolute: 0 K/uL (ref 0.0–0.1)
Basophils Relative: 0.7 % (ref 0.0–3.0)
Eosinophils Absolute: 0.1 K/uL (ref 0.0–0.7)
Eosinophils Relative: 2.5 % (ref 0.0–5.0)
HCT: 45 % (ref 39.0–52.0)
Hemoglobin: 15.1 g/dL (ref 13.0–17.0)
Lymphocytes Relative: 40.8 % (ref 12.0–46.0)
Lymphs Abs: 1.9 K/uL (ref 0.7–4.0)
MCHC: 33.6 g/dL (ref 30.0–36.0)
MCV: 96.1 fl (ref 78.0–100.0)
Monocytes Absolute: 0.4 K/uL (ref 0.1–1.0)
Monocytes Relative: 8.9 % (ref 3.0–12.0)
Neutro Abs: 2.2 K/uL (ref 1.4–7.7)
Neutrophils Relative %: 47.1 % (ref 43.0–77.0)
Platelets: 192 K/uL (ref 150.0–400.0)
RBC: 4.68 Mil/uL (ref 4.22–5.81)
RDW: 13.8 % (ref 11.5–15.5)
WBC: 4.6 K/uL (ref 4.0–10.5)

## 2024-02-21 LAB — HEPATIC FUNCTION PANEL
ALT: 18 U/L (ref 0–53)
AST: 17 U/L (ref 0–37)
Albumin: 4.4 g/dL (ref 3.5–5.2)
Alkaline Phosphatase: 50 U/L (ref 39–117)
Bilirubin, Direct: 0.2 mg/dL (ref 0.0–0.3)
Total Bilirubin: 1 mg/dL (ref 0.2–1.2)
Total Protein: 6.5 g/dL (ref 6.0–8.3)

## 2024-02-21 LAB — TSH: TSH: 3.07 u[IU]/mL (ref 0.35–5.50)

## 2024-02-21 LAB — HEMOGLOBIN A1C: Hgb A1c MFr Bld: 6.1 % (ref 4.6–6.5)

## 2024-02-22 ENCOUNTER — Ambulatory Visit: Payer: Self-pay | Admitting: Internal Medicine

## 2024-03-27 ENCOUNTER — Telehealth: Payer: Self-pay

## 2024-03-27 NOTE — Telephone Encounter (Signed)
 Copied from CRM [id number]. Topic: Clinical - Medical Advice >> Mar 27, 2024 12:43 PM Chasity T wrote: Reason for CRM: Patient states that when doing the injection for Evolocumab  (REPATHA  SURECLICK) 140 MG/ML SOAJ it was dripping fluid and bleeding. The needle was still sticking out and he doesn't know if he got the full dose or not. And is wondering if he can get an other one to replace the one that just messed up.

## 2024-03-27 NOTE — Telephone Encounter (Signed)
 Noted. Let me know if he needs to be seen or needs anything.

## 2024-03-27 NOTE — Telephone Encounter (Signed)
 LMTCB

## 2024-03-27 NOTE — Telephone Encounter (Unsigned)
 Copied from CRM [id number]. Topic: General - Call Back - No Documentation >> Mar 27, 2024  3:30 PM Macario HERO wrote: Reason for CRM: Patient returning Trisha's call. Called CAL. Advised to send CRM for a call back.

## 2024-03-27 NOTE — Telephone Encounter (Signed)
 FYI for you- Patient is going to call his pharmacy about getting a replacement repatha  injection.   He took his injection 3 days ago and the auto injector pen was messed up. The needle did not retract like it is supposed to and the medicine leaked out after he pulled the needle out of his skin. Yesterday, he injected himself again because he said all of his medicine from the other pen leaked out. The area he gave the first injection is bruised but he is not having any lingering side effects. He says the bruise is getting lighter each day. No pain, redness, irritation, tenderness to the area. He just wanted to know how to get a replacement pen since one of his was messed up. He will let us  know if the pharmacy needs us  to do anything.

## 2024-03-28 NOTE — Telephone Encounter (Signed)
 noted

## 2024-04-12 DIAGNOSIS — D2261 Melanocytic nevi of right upper limb, including shoulder: Secondary | ICD-10-CM | POA: Diagnosis not present

## 2024-04-12 DIAGNOSIS — D2262 Melanocytic nevi of left upper limb, including shoulder: Secondary | ICD-10-CM | POA: Diagnosis not present

## 2024-04-12 DIAGNOSIS — D225 Melanocytic nevi of trunk: Secondary | ICD-10-CM | POA: Diagnosis not present

## 2024-04-12 DIAGNOSIS — D2272 Melanocytic nevi of left lower limb, including hip: Secondary | ICD-10-CM | POA: Diagnosis not present

## 2024-04-12 DIAGNOSIS — L821 Other seborrheic keratosis: Secondary | ICD-10-CM | POA: Diagnosis not present

## 2024-04-12 DIAGNOSIS — L720 Epidermal cyst: Secondary | ICD-10-CM | POA: Diagnosis not present

## 2024-04-12 DIAGNOSIS — D2271 Melanocytic nevi of right lower limb, including hip: Secondary | ICD-10-CM | POA: Diagnosis not present

## 2024-05-04 ENCOUNTER — Encounter: Payer: Self-pay | Admitting: Pharmacist

## 2024-05-04 ENCOUNTER — Telehealth: Payer: Self-pay | Admitting: Internal Medicine

## 2024-05-04 NOTE — Progress Notes (Signed)
 [employer] - Re-enrollment   Medication(s): All cholesterol medications (Repatha )   Currently Enrolled: Through 04/27/2024; Expired   Application Status:  Approved for re-enrollment    [employer] ID: [id number] Fund: Hypercholesterolemia - Medicare Access Assistance Type: Co-pay Start Date: 05/01/2024 End Date: 04/30/2025               Rx Card: Card No.  [id number] RX BIN:  [id number] PCN:  [id number] Group:  [id number]    Patient updated via MyChart message.     Manuelita FABIENE Kobs, PharmD Clinical Pharmacist [facility] [phone]

## 2024-05-04 NOTE — Telephone Encounter (Signed)
 FYI..The form was placed underneath the [employer].

## 2024-05-04 NOTE — Telephone Encounter (Signed)
 Noted

## 2024-05-04 NOTE — Telephone Encounter (Signed)
 Pt came into office with a form from the pharmacist stating that the pharmacist is rejecting his repatha . Pt stated that the pharmacist ask for the pt to give this form to Dr. Glendia so he can  be put back on for assistance with his medication. Please advise.

## 2024-06-15 ENCOUNTER — Emergency Department (HOSPITAL_COMMUNITY)

## 2024-06-15 ENCOUNTER — Emergency Department (HOSPITAL_COMMUNITY): Admission: EM | Admit: 2024-06-15 | Discharge: 2024-06-15 | Disposition: A | Discharge status: Home / Self Care

## 2024-06-15 ENCOUNTER — Encounter (HOSPITAL_COMMUNITY): Payer: Self-pay | Admitting: Emergency Medicine

## 2024-06-15 ENCOUNTER — Ambulatory Visit: Payer: Self-pay | Admitting: Internal Medicine

## 2024-06-15 DIAGNOSIS — R0781 Pleurodynia: Secondary | ICD-10-CM | POA: Diagnosis not present

## 2024-06-15 DIAGNOSIS — M542 Cervicalgia: Secondary | ICD-10-CM | POA: Diagnosis not present

## 2024-06-15 DIAGNOSIS — S8012XA Contusion of left lower leg, initial encounter: Secondary | ICD-10-CM | POA: Diagnosis not present

## 2024-06-15 DIAGNOSIS — S299XXA Unspecified injury of thorax, initial encounter: Secondary | ICD-10-CM | POA: Diagnosis not present

## 2024-06-15 DIAGNOSIS — M546 Pain in thoracic spine: Secondary | ICD-10-CM | POA: Diagnosis not present

## 2024-06-15 DIAGNOSIS — S0990XA Unspecified injury of head, initial encounter: Secondary | ICD-10-CM | POA: Diagnosis not present

## 2024-06-15 DIAGNOSIS — M79605 Pain in left leg: Secondary | ICD-10-CM | POA: Diagnosis not present

## 2024-06-15 DIAGNOSIS — I700 Atherosclerosis of aorta: Secondary | ICD-10-CM | POA: Diagnosis not present

## 2024-06-15 DIAGNOSIS — S3993XA Unspecified injury of pelvis, initial encounter: Secondary | ICD-10-CM | POA: Diagnosis not present

## 2024-06-15 DIAGNOSIS — M4802 Spinal stenosis, cervical region: Secondary | ICD-10-CM | POA: Diagnosis not present

## 2024-06-15 DIAGNOSIS — S199XXA Unspecified injury of neck, initial encounter: Secondary | ICD-10-CM | POA: Diagnosis not present

## 2024-06-15 DIAGNOSIS — M545 Low back pain, unspecified: Secondary | ICD-10-CM | POA: Diagnosis not present

## 2024-06-15 DIAGNOSIS — R0789 Other chest pain: Secondary | ICD-10-CM | POA: Diagnosis not present

## 2024-06-15 DIAGNOSIS — R1030 Lower abdominal pain, unspecified: Secondary | ICD-10-CM | POA: Diagnosis not present

## 2024-06-15 DIAGNOSIS — M47812 Spondylosis without myelopathy or radiculopathy, cervical region: Secondary | ICD-10-CM | POA: Diagnosis not present

## 2024-06-15 DIAGNOSIS — S3991XA Unspecified injury of abdomen, initial encounter: Secondary | ICD-10-CM | POA: Diagnosis not present

## 2024-06-15 LAB — I-STAT CHEM 8, ED
BUN: 15 mg/dL (ref 8–23)
Calcium, Ion: 1.13 mmol/L — ABNORMAL LOW (ref 1.15–1.40)
Chloride: 105 mmol/L (ref 98–111)
Creatinine, Ser: 1.3 mg/dL — ABNORMAL HIGH (ref 0.61–1.24)
Glucose, Bld: 98 mg/dL (ref 70–99)
HCT: 44 % (ref 39.0–52.0)
Hemoglobin: 15 g/dL (ref 13.0–17.0)
Potassium: 4.3 mmol/L (ref 3.5–5.1)
Sodium: 142 mmol/L (ref 135–145)
TCO2: 25 mmol/L (ref 22–32)

## 2024-06-15 LAB — CBC
HCT: 44.2 % (ref 39.0–52.0)
Hemoglobin: 15 g/dL (ref 13.0–17.0)
MCH: 32.5 pg (ref 26.0–34.0)
MCHC: 33.9 g/dL (ref 30.0–36.0)
MCV: 95.9 fL (ref 80.0–100.0)
Platelets: 203 K/uL (ref 150–400)
RBC: 4.61 MIL/uL (ref 4.22–5.81)
RDW: 12.9 % (ref 11.5–15.5)
WBC: 5.9 K/uL (ref 4.0–10.5)
nRBC: 0 % (ref 0.0–0.2)

## 2024-06-15 LAB — COMPREHENSIVE METABOLIC PANEL WITH GFR
ALT: 19 U/L (ref 0–44)
AST: 20 U/L (ref 15–41)
Albumin: 3.6 g/dL (ref 3.5–5.0)
Alkaline Phosphatase: 58 U/L (ref 38–126)
Anion gap: 8 (ref 5–15)
BUN: 14 mg/dL (ref 8–23)
CO2: 24 mmol/L (ref 22–32)
Calcium: 8.9 mg/dL (ref 8.9–10.3)
Chloride: 108 mmol/L (ref 98–111)
Creatinine, Ser: 1.21 mg/dL (ref 0.61–1.24)
GFR, Estimated: >60 mL/min (ref >60)
Glucose, Bld: 102 mg/dL — ABNORMAL HIGH (ref 70–99)
Potassium: 4.3 mmol/L (ref 3.5–5.1)
Sodium: 140 mmol/L (ref 135–145)
Total Bilirubin: 1.3 mg/dL — ABNORMAL HIGH (ref 0.0–1.2)
Total Protein: 6.1 g/dL — ABNORMAL LOW (ref 6.5–8.1)

## 2024-06-15 MED ORDER — LIDOCAINE 5 % EX PTCH: 1.0000 | MEDICATED_PATCH | CUTANEOUS | 0 refills | Status: DC

## 2024-06-15 MED ORDER — OXYCODONE-ACETAMINOPHEN 5-325 MG PO TABS
2.0000 | ORAL_TABLET | Freq: Once | ORAL | Status: DC
  Filled 2024-06-15: qty 2

## 2024-06-15 MED ORDER — IOHEXOL 350 MG/ML SOLN
75.0000 mL | Freq: Once | INTRAVENOUS | Status: AC | PRN
  Administered 2024-06-15: 75 mL via INTRAVENOUS

## 2024-06-15 MED ORDER — METHOCARBAMOL 500 MG PO TABS: 500.0000 mg | ORAL_TABLET | Freq: Two times a day (BID) | ORAL | 0 refills | Status: DC
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
## 2024-06-15 NOTE — ED Notes (Signed)
 Pt was given discharge instructions and verbalized understanding. Wife at bedside to take him home.
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
## 2024-06-15 NOTE — ED Notes (Signed)
 Pt refused pain medication and stated he felt his pain was not enough to take anything at the moment. Provider made aware. Pt has been told to let someone now if pain gets worse and he would like pain medication.

## 2024-06-16 ENCOUNTER — Ambulatory Visit: Admitting: Nurse Practitioner

## 2024-06-16 ENCOUNTER — Encounter: Payer: Self-pay | Admitting: Nurse Practitioner

## 2024-06-16 DIAGNOSIS — I700 Atherosclerosis of aorta: Secondary | ICD-10-CM
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
## 2024-06-19 ENCOUNTER — Encounter: Payer: Self-pay | Admitting: Cardiology

## 2024-06-19 MED ORDER — CLOPIDOGREL BISULFATE 75 MG PO TABS: 75.0000 mg | ORAL_TABLET | Freq: Every day | ORAL | 3 refills | Status: DC

## 2024-06-20 ENCOUNTER — Encounter: Payer: Self-pay | Admitting: Nurse Practitioner

## 2024-06-20 DIAGNOSIS — I700 Atherosclerosis of aorta: Secondary | ICD-10-CM | POA: Insufficient documentation
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
## 2024-06-20 NOTE — Telephone Encounter (Signed)
 Please call him and confirm he is doing ok.  Per message, he is doing better. I agree with follow up with cardiology. He has had a CTA previouslly and saw Dr Budd Kindle.  Recommend f/u with him. Also, he is overdue f/u appt with me. Please schedule.  If any questions or concerns, let me know.

## 2024-06-20 NOTE — Assessment & Plan Note (Addendum)
 Managed with Repatha  Lab Results  Component Value Date   CHOL 137 02/21/2024   HDL 48.00 02/21/2024   LDLCALC 68 02/21/2024   TRIG 107.0 02/21/2024   CHOLHDL 3 02/21/2024  - Followed by cardiology.

## 2024-06-23 ENCOUNTER — Encounter: Payer: Self-pay | Admitting: Internal Medicine

## 2024-06-23 ENCOUNTER — Ambulatory Visit: Admitting: Internal Medicine

## 2024-06-23 VITALS — BP 126/78 | HR 57 | Temp 97.7°F | Ht 68.0 in | Wt 160.0 lb

## 2024-06-23 DIAGNOSIS — R739 Hyperglycemia, unspecified: Secondary | ICD-10-CM

## 2024-06-23 DIAGNOSIS — Z860100 Personal history of colon polyps, unspecified: Secondary | ICD-10-CM

## 2024-06-23 DIAGNOSIS — E7800 Pure hypercholesterolemia, unspecified: Secondary | ICD-10-CM

## 2024-06-23 DIAGNOSIS — R944 Abnormal results of kidney function studies: Secondary | ICD-10-CM

## 2024-06-23 DIAGNOSIS — Z7185 Encounter for immunization safety counseling: Secondary | ICD-10-CM

## 2024-06-23 DIAGNOSIS — Z1211 Encounter for screening for malignant neoplasm of colon: Secondary | ICD-10-CM
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
## 2024-06-25 ENCOUNTER — Telehealth: Payer: Self-pay | Admitting: Internal Medicine

## 2024-06-25 DIAGNOSIS — I2510 Atherosclerotic heart disease of native coronary artery without angina pectoris: Secondary | ICD-10-CM | POA: Insufficient documentation

## 2024-06-25 NOTE — Telephone Encounter (Signed)
 He reports that he had shingles vaccine - [facility]. Need to confirm had both shingles and date and update chart. Thanks.

## 2024-06-25 NOTE — Assessment & Plan Note (Signed)
 Reports received shingles vaccine [facility]. Need to confirm date.
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
## 2024-06-25 NOTE — Assessment & Plan Note (Signed)
Continue to stay hydrated.  Avoid anti-inflammatories.  Has adjusted diet.  Feels good. Follow metabolic panel.

## 2024-06-25 NOTE — Assessment & Plan Note (Signed)
 Has adjusted diet.  Follow met b and a1c.   Lab Results  Component Value Date   HGBA1C 6.1 02/21/2024
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
## 2024-06-25 NOTE — Assessment & Plan Note (Signed)
 Continue repatha . Intolerant to statin medication. Has adjusted diet. Follow lipid panel.  Lab Results  Component Value Date   CHOL 137 02/21/2024   HDL 48.00 02/21/2024   LDLCALC 68 02/21/2024   TRIG 107.0 02/21/2024   CHOLHDL 3 02/21/2024

## 2024-06-25 NOTE — Assessment & Plan Note (Signed)
 Colonoscopy 10/2013.  Recommended f/u in 10 years.  Discussed GI referral.

## 2024-06-26 ENCOUNTER — Encounter: Payer: Self-pay | Admitting: Internal Medicine

## 2024-06-26 ENCOUNTER — Ambulatory Visit: Payer: Self-pay

## 2024-06-26 NOTE — Telephone Encounter (Signed)
 LEFT VM FOR PT TO GIVE OFFICE A CALL BACK. PLEASER TRIAGE PT WHEN HE CALLS BACK

## 2024-06-26 NOTE — Telephone Encounter (Signed)
 FYI Only or Action Required?: FYI only for provider: appointment scheduled on 06/27/24.  Patient was last seen in primary care on 06/23/2024 by Glendia Shad, MD.  Called Nurse Triage reporting Dizziness.  Symptoms began several days ago.  Interventions attempted: Rest, hydration, or home remedies.  Symptoms are: stable.  Triage Disposition: See PCP When Office is Open (Within 3 Days)  Patient/caregiver understands and will follow disposition?: Yes Reason for Disposition  [1] MILD dizziness (e.g., walking normally) AND [2] has NOT been evaluated by doctor (or NP/PA) for this  (Exception: Dizziness caused by heat exposure, sudden standing, or poor fluid intake.)  Answer Assessment - Initial Assessment Questions Denies headaches, chest paint, SOB. Patient reports feeling really good right now. Patient reports taking BP 10 times in 2 minutes, average BP 128/65.   1. DESCRIPTION: Describe your dizziness.     Not feeling dizzy, but wobbly / off balance  2. LIGHTHEADED: Do you feel lightheaded? (e.g., somewhat faint, woozy, weak upon standing)     Lightheaded  3 SEVERITY: How bad is it?  Do you feel like you are going to faint? Can you stand and walk?     Comes and goes  4. ONSET:  When did the dizziness begin?     3 days ago  5. AGGRAVATING FACTORS: Does anything make it worse? (e.g., standing, change in head position)     Getting up or when stopping exercises   6. HEART RATE: Can you tell me your heart rate? How many beats in 15 seconds?  (Note: Not all patients can do this.)       Patient reports baseline resting HR is around 55 and states that around what it has been  7. CAUSE: What do you think is causing the dizziness? (e.g., decreased fluids or food, diarrhea, emotional distress, heat exposure, new medicine, sudden standing, vomiting; unknown)     Plavix   8. RECURRENT SYMPTOM: Have you had dizziness before? If Yes, ask: When was the last time?  What happened that time?     Denies  9. OTHER SYMPTOMS: Do you have any other symptoms? (e.g., fever, chest pain, vomiting, diarrhea, bleeding)       Lethargic, reports muscles feeling weak. nauseous  Protocols used: Dizziness - Lightheadedness-A-AH  Copied from CRM #[id number]. Topic: Clinical - Red Word Triage >> Jun 26, 2024  3:40 PM Rosina BIRCH wrote: Red Word that prompted transfer to Nurse Triage: patient has been having some wobbly episodes and he fell after only about 20 mins on the treadmill feeling slightly weak all over and a little nausea and light headed

## 2024-06-27 ENCOUNTER — Encounter: Payer: Self-pay | Admitting: Family

## 2024-06-27 ENCOUNTER — Ambulatory Visit: Admitting: Family

## 2024-06-27 VITALS — BP 128/78 | HR 65 | Temp 97.8°F | Ht 68.0 in | Wt 164.0 lb

## 2024-06-27 DIAGNOSIS — I2583 Coronary atherosclerosis due to lipid rich plaque: Secondary | ICD-10-CM | POA: Diagnosis not present

## 2024-06-27 DIAGNOSIS — I700 Atherosclerosis of aorta: Secondary | ICD-10-CM | POA: Diagnosis not present

## 2024-06-27 DIAGNOSIS — R931 Abnormal findings on diagnostic imaging of heart and coronary circulation: Secondary | ICD-10-CM | POA: Diagnosis not present

## 2024-06-27 DIAGNOSIS — H6121 Impacted cerumen, right ear: Secondary | ICD-10-CM | POA: Diagnosis not present

## 2024-06-27 DIAGNOSIS — I2510 Atherosclerotic heart disease of native coronary artery without angina pectoris: Secondary | ICD-10-CM | POA: Diagnosis not present

## 2024-06-27 DIAGNOSIS — R42 Dizziness and giddiness: Secondary | ICD-10-CM

## 2024-06-27 NOTE — Progress Notes (Unsigned)
 Acute Office Visit  Subjective:     Patient ID: Elijah Walker, male    DOB: 02/08/1951, 73 y.o.   MRN: [id number]  No chief complaint on file.   HPI Patient is in today with concerns of lightheadedness, dizziness and feeling wobbly over the last 4 to 5 days.  Reports symptoms started when he initially started taking Plavix .  Reports having a fall around a 3 but he was able to catch himself and was not injured.  He typically exercises on a treadmill at a speed of 3 and elevation of 15 on a regular basis and has not had any issues.  Denies any chest pain, palpitations, or shortness of breath.  Does admit to the dizziness causing some nausea.  He has had a slight headache as well.  Today, his symptoms have improved and he feels much better.  Has a history of an abnormal calcium  scoring CT and would like to have it repeated.  Review of Systems  HENT:  Negative for ear pain.   Respiratory: Negative.    Cardiovascular: Negative.  Negative for chest pain and palpitations.  Gastrointestinal:  Positive for nausea. Negative for vomiting.  Musculoskeletal: Negative.   Neurological:  Positive for dizziness and headaches.  Psychiatric/Behavioral: Negative.    All other systems reviewed and are negative.   Past Medical History:  Diagnosis Date   Blood in stool    H/O   History of chicken pox    Hyperlipidemia    PONV (postoperative nausea and vomiting)     Social History   Socioeconomic History   Marital status: Married    Spouse name: Not on file   Number of children: 2   Years of education: Not on file   Highest education level: Not on file  Occupational History   Not on file  Tobacco Use   Smoking status: Never   Smokeless tobacco: Never  Vaping Use   Vaping status: Never Used  Substance and Sexual Activity   Alcohol use: No    Alcohol/week: 0.0 standard drinks of alcohol    Comment: occ   Drug use: No   Sexual activity: Yes  Other Topics Concern   Not  on file  Social History Narrative   Not on file   Social Drivers of Health   Tobacco Use: Low Risk (06/27/2024)   Patient History    Smoking Tobacco Use: Never    Smokeless Tobacco Use: Never    Passive Exposure: Not on file  Financial Resource Strain: Low Risk (07/12/2023)   Overall Financial Resource Strain (CARDIA)    Difficulty of Paying Living Expenses: Not hard at all  Food Insecurity: No Food Insecurity (07/12/2023)   Hunger Vital Sign    Worried About Running Out of Food in the Last Year: Never true    Ran Out of Food in the Last Year: Never true  Transportation Needs: No Transportation Needs (07/12/2023)   PRAPARE - Administrator, Civil Service (Medical): No    Lack of Transportation (Non-Medical): No  Physical Activity: Sufficiently Active (07/12/2023)   Exercise Vital Sign    Days of Exercise per Week: 6 days    Minutes of Exercise per Session: 40 min  Stress: No Stress Concern Present (07/12/2023)   [employer] of Occupational Health - Occupational Stress Questionnaire    Feeling of Stress : Not at all  Social Connections: Socially Integrated (07/12/2023)   Social Connection and Isolation Panel    Frequency of Communication  with Friends and Family: More than three times a week    Frequency of Social Gatherings with Friends and Family: More than three times a week    Attends Religious Services: More than 4 times per year    Active Member of [employer] or Organizations: Yes    Attends Banker Meetings: More than 4 times per year    Marital Status: Married  Catering Manager Violence: Not At Risk (07/12/2023)   Humiliation, Afraid, Rape, and Kick questionnaire    Fear of Current or Ex-Partner: No    Emotionally Abused: No    Physically Abused: No    Sexually Abused: No  Depression (PHQ2-9): Low Risk (06/27/2024)   Depression (PHQ2-9)    PHQ-2 Score: 0  Alcohol Screen: Low Risk (07/12/2023)   Alcohol Screen     Last Alcohol Screening Score (AUDIT): 1  Housing: Unknown (07/12/2023)   Housing Stability Vital Sign    Unable to Pay for Housing in the Last Year: No    Number of Times Moved in the Last Year: Not on file    Homeless in the Last Year: No  Utilities: Not At Risk (07/12/2023)   AHC Utilities    Threatened with loss of utilities: No  Health Literacy: Adequate Health Literacy (07/12/2023)   B1300 Health Literacy    Frequency of need for help with medical instructions: Never    Past Surgical History:  Procedure Laterality Date   CHOLECYSTECTOMY  1998   COLONOSCOPY     SHOULDER ARTHROSCOPY WITH BANKART REPAIR Left 09/14/2017   Procedure: SHOULDER ARTHROSCOPY WITH MINI OPEN ROTATOR CUFF REPAIR, SUBACROMINAL DECOMPRESSION, LABRAL REPAIR,BICEP TENOTOMY, DISTAL CLAVICLE EXCISION ;  Surgeon: Marchia Drivers, MD;  Location: [facility];  Service: Orthopedics;  Laterality: Left;   SHOULDER ARTHROSCOPY WITH OPEN ROTATOR CUFF REPAIR AND DISTAL CLAVICLE ACROMINECTOMY Right 01/16/2021   Procedure: RIGHT SHOULDER ARTHROSCOPY WITH MINI-OPEN ROTATOR CUFF REPAIR, SUBACCROMINAL DECOMPRESSION AND DISTAL CLAVICLE EXCISION;  Surgeon: Marchia Drivers, MD;  Location: [facility];  Service: Orthopedics;  Laterality: Right;   skin lesion on leg Left 06/2023    Family History  Problem Relation Age of Onset   Dementia Mother    Diabetes Father    Colitis Sister    Autoimmune disease Brother    Colitis Daughter     Allergies[1]  Medications Ordered Prior to Encounter[2]  BP 128/78   Pulse 65   Temp 97.8 F (36.6 C)   Ht 5' 8 (1.727 m)   Wt 164 lb (74.4 kg)   SpO2 97%   BMI 24.94 kg/m chart     Objective:    There were no vitals taken for this visit.   Physical Exam Vitals and nursing note reviewed.  Constitutional:      Appearance: Normal appearance. He is normal weight.  HENT:     Head: Normocephalic.     Right Ear: Ear canal and external ear normal.     Left Ear: Ear canal  and external ear normal.     Ears:     Comments: Right ear: Cerumen impaction.  Informed consent was obtained and peroxide gel was inserted into the ears bilaterally using the lavage kit the ears were lavaged until clean.Inspection with a cerumen spoon removed residual wax. Patient tolerated the procedure well.     Nose: Nose normal.     Mouth/Throat:     Mouth: Mucous membranes are moist.  Cardiovascular:     Rate and Rhythm: Normal rate and regular rhythm.  Pulmonary:  Effort: Pulmonary effort is normal.     Breath sounds: Normal breath sounds.  Musculoskeletal:        General: Normal range of motion.  Skin:    General: Skin is warm and dry.  Neurological:     General: No focal deficit present.     Mental Status: He is alert and oriented to person, place, and time. Mental status is at baseline.  Psychiatric:        Mood and Affect: Mood normal.        Behavior: Behavior normal.        Thought Content: Thought content normal.        Judgment: Judgment normal.    No results found for any visits on 06/27/24.      Assessment & Plan:   Problem List Items Addressed This Visit     Elevated coronary artery calcium  score   Relevant Orders   CT CARDIAC SCORING (SELF PAY ONLY)   CAD (coronary artery disease)   Relevant Orders   CT CARDIAC SCORING (SELF PAY ONLY)   Aortic atherosclerosis   Relevant Orders   CT CARDIAC SCORING (SELF PAY ONLY)   Other Visit Diagnoses       Dizziness    -  Primary     Right ear impacted cerumen           No orders of the defined types were placed in this encounter. Considering patient is feeling better today we will continue Plavix .  Advised he may take some time for him to adjust to the medication but that should get better.  If he feels the symptoms are beginning to worsen as they have a few days ago let cardiology know.  For now continue current medications.  CT cardiac scoring ordered.  Will have it scheduled and notify patient pending.   Cerumen impaction was successfully removed today.  No follow-ups on file.  Tu Shimmel B Kiing Deakin, FNP       [1] No Known Allergies [2] Current Outpatient Medications on File Prior to Visit  Medication Sig Dispense Refill   Cholecalciferol (VITAMIN D) 50 MCG (2000 UT) CAPS Take by mouth daily.     clopidogrel  (PLAVIX ) 75 MG tablet Take 1 tablet (75 mg total) by mouth daily. 90 tablet 3   Evolocumab  (REPATHA  SURECLICK) 140 MG/ML SOAJ Inject 140 mg into the skin every 14 (fourteen) days. 6 mL 1   lidocaine  (LIDODERM ) 5 % Place 1 patch onto the skin daily. Remove & Discard patch within 12 hours or as directed by MD 30 patch 0   methocarbamol  (ROBAXIN ) 500 MG tablet Take 1 tablet (500 mg total) by mouth 2 (two) times daily. 20 tablet 0   No current facility-administered medications on file prior to visit.

## 2024-07-03 NOTE — Telephone Encounter (Signed)
 He had a dose of Zostra vax on 01/23/11. Patient has not had but the one dose in 2012 per [facility] in [place].

## 2024-07-04 ENCOUNTER — Ambulatory Visit
Admission: RE | Admit: 2024-07-04 | Discharge: 2024-07-04 | Disposition: A | Discharge status: Home / Self Care | Payer: Self-pay | Source: Ambulatory Visit | Attending: Family | Admitting: Family

## 2024-07-04 ENCOUNTER — Encounter: Payer: Self-pay | Admitting: Internal Medicine

## 2024-07-04 DIAGNOSIS — I2583 Coronary atherosclerosis due to lipid rich plaque: Secondary | ICD-10-CM

## 2024-07-04 DIAGNOSIS — I700 Atherosclerosis of aorta: Secondary | ICD-10-CM

## 2024-07-04 DIAGNOSIS — I2510 Atherosclerotic heart disease of native coronary artery without angina pectoris: Secondary | ICD-10-CM

## 2024-07-04 DIAGNOSIS — R931 Abnormal findings on diagnostic imaging of heart and coronary circulation: Secondary | ICD-10-CM

## 2024-07-04 NOTE — Telephone Encounter (Signed)
 Pt notified. Pt stated he didnt have shots any where else. Pt [facility]

## 2024-07-04 NOTE — Telephone Encounter (Unsigned)
 Copied from CRM #[id number]. Topic: General - Call Back - No Documentation >> Jul 04, 2024  9:01 AM Franky GRADE wrote: Reason for CRM: Patient is returning a call he received from the office; however, no documentation on file. Patient did not receive a voicemail.

## 2024-07-04 NOTE — Telephone Encounter (Signed)
 Please notify pt of this information and see if there ia anywhere else he would have received shingles vaccine. If this is the only vaccine in 2012, he needs current shingles vaccine. Let me know if any questions.

## 2024-07-04 NOTE — Telephone Encounter (Signed)
 Lvm for pt to return call. Okay to relay

## 2024-07-05 ENCOUNTER — Ambulatory Visit: Payer: Self-pay | Admitting: Family

## 2024-07-05 NOTE — Telephone Encounter (Signed)
 Pt made aware pcp not in office. Notified pt if he is having issues to schedule appt
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
## 2024-07-07 NOTE — Telephone Encounter (Signed)
 Lvm for pt to return call okay to relay message

## 2024-07-17 VITALS — BP 126/60 | Ht 67.0 in | Wt 156.0 lb

## 2024-07-17 DIAGNOSIS — Z0000 Encounter for general adult medical examination without abnormal findings: Secondary | ICD-10-CM

## 2024-07-17 NOTE — Patient Instructions (Addendum)
 Elijah Walker,  Thank you for taking the time for your Medicare Wellness Visit. I appreciate your continued commitment to your health goals. Please review the care plan we discussed, and feel free to reach out if I can assist you further.  Please note that Annual Wellness Visits do not include a physical exam. Some assessments may be limited, especially if the visit was conducted virtually. If needed, we may recommend an in-person follow-up with your provider.  Ongoing Care Seeing your primary care provider every 3 to 6 months helps us  monitor your health and provide consistent, personalized care.  Remember to get your shingles vaccines. Make sure Dr. Glendia gets the results of the stool test that you did for your [employer]. .   Referrals If a referral was made during today's visit and you haven't received any updates within two weeks, please contact the referred provider directly to check on the status.  Recommended Screenings:  Health Maintenance  Topic Date Due   Zoster (Shingles) Vaccine (1 of 2) 07/07/2001   Colon Cancer Screening  11/01/2023   COVID-19 Vaccine (9 - 2025-26 season) 10/04/2024   Medicare Annual Wellness Visit  07/17/2025   DTaP/Tdap/Td vaccine (3 - Td or Tdap) 07/25/2033   Pneumococcal Vaccine for age over 78  Completed   Flu Shot  Completed   Hepatitis C Screening  Completed   Meningitis B Vaccine  Aged Out       07/17/2024    2:44 PM  Advanced Directives  Does Patient Have a Medical Advance Directive? Yes  Type of Estate Agent of [place];Living will  Does patient want to make changes to medical advance directive? No - Patient declined  Copy of Healthcare Power of Attorney in Chart? No - copy requested    Vision: Annual vision screenings are recommended for early detection of glaucoma, cataracts, and diabetic retinopathy. These exams can also reveal signs of chronic conditions such as diabetes and high blood pressure.  Dental:  Annual dental screenings help detect early signs of oral cancer, gum disease, and other conditions linked to overall health, including heart disease and diabetes.  Please see the attached documents for additional preventive care recommendations.

## 2024-07-17 NOTE — Progress Notes (Signed)
 "  Chief Complaint  Patient presents with   Medicare Wellness     Subjective:   Elijah Walker is a 74 y.o. male who presents for a Medicare Annual Wellness Visit.  Visit info / Clinical Intake: Medicare Wellness Visit Type:: Subsequent Annual Wellness Visit Persons participating in visit and providing information:: patient Medicare Wellness Visit Mode:: Telephone If telephone:: video error Since this visit was completed virtually, some vitals may be partially provided or unavailable. Missing vitals are due to the limitations of the virtual format.: Documented vitals are patient reported If Telephone or Video please confirm:: I connected with patient using audio/video enable telemedicine. I verified patient identity with two identifiers, discussed telehealth limitations, and patient agreed to proceed. Patient Location:: Home Provider Location:: Office/Home Interpreter Needed?: No Pre-visit prep was completed: yes AWV questionnaire completed by patient prior to visit?: yes Date:: 07/17/24 Living arrangements:: (Patient-Rptd) lives with spouse/significant other Patient's Overall Health Status Rating: (Patient-Rptd) excellent Typical amount of pain: (Patient-Rptd) none Does pain affect daily life?: (Patient-Rptd) no Are you currently prescribed opioids?: no  Dietary Habits and Nutritional Risks How many meals a day?: (Patient-Rptd) 4 Eats fruit and vegetables daily?: (Patient-Rptd) yes Most meals are obtained by: (Patient-Rptd) preparing own meals In the last 2 weeks, have you had any of the following?: none Diabetic:: no  Functional Status Activities of Daily Living (to include ambulation/medication): (Patient-Rptd) Independent Ambulation: (Patient-Rptd) Independent Medication Administration: (Patient-Rptd) Independent Home Management (perform basic housework or laundry): (Patient-Rptd) Independent Manage your own finances?: (Patient-Rptd) yes Primary transportation is:  (Patient-Rptd) driving Concerns about vision?: no *vision screening is required for WTM* Concerns about hearing?: no  Fall Screening Falls in the past year?: (Patient-Rptd) 1 Number of falls in past year: (Patient-Rptd) 0 Was there an injury with Fall?: (Patient-Rptd) 0 Fall Risk Category Calculator: (Patient-Rptd) 1 Patient Fall Risk Level: (Patient-Rptd) Low Fall Risk  Fall Risk Patient at Risk for Falls Due to: History of fall(s); Impaired balance/gait Fall risk Follow up: Falls evaluation completed; Falls prevention discussed; Education provided  Home and Transportation Safety: All rugs have non-skid backing?: (Patient-Rptd) yes All stairs or steps have railings?: (Patient-Rptd) yes Grab bars in the bathtub or shower?: (Patient-Rptd) yes Have non-skid surface in bathtub or shower?: (Patient-Rptd) yes Good home lighting?: (Patient-Rptd) yes Regular seat belt use?: (Patient-Rptd) yes Hospital stays in the last year:: no How many hospital stays:: (Patient-Rptd) 1  Cognitive Assessment Difficulty concentrating, remembering, or making decisions? : (Patient-Rptd) no Will 6CIT or Mini Cog be Completed: yes What year is it?: 0 points What month is it?: 0 points Give patient an address phrase to remember (5 components): [address] [place] About what time is it?: 0 points Count backwards from 20 to 1: 0 points Say the months of the year in reverse: 0 points Repeat the address phrase from earlier: 0 points 6 CIT Score: 0 points  Advance Directives (For Healthcare) Does Patient Have a Medical Advance Directive?: Yes Does patient want to make changes to medical advance directive?: No - Patient declined Type of Advance Directive: Healthcare Power of [place]; Living will Copy of Healthcare Power of Attorney in Chart?: No - copy requested Copy of Living Will in Chart?: No - copy requested  Reviewed/Updated  Reviewed/Updated: Reviewed All (Medical, Surgical, Family,  Medications, Allergies, Care Teams, Patient Goals)    Allergies (verified) Patient has no known allergies.   Current Medications (verified) Outpatient Encounter Medications as of 07/17/2024  Medication Sig   Cholecalciferol (VITAMIN D) 50 MCG (2000  UT) CAPS Take by mouth daily.   clopidogrel  (PLAVIX ) 75 MG tablet Take 1 tablet (75 mg total) by mouth daily.   Evolocumab  (REPATHA  SURECLICK) 140 MG/ML SOAJ Inject 140 mg into the skin every 14 (fourteen) days.   Omega-3 Fatty Acids (OMEGA 3 PO) Take by mouth 2 (two) times daily.   lidocaine  (LIDODERM ) 5 % Place 1 patch onto the skin daily. Remove & Discard patch within 12 hours or as directed by MD (Patient not taking: Reported on 07/17/2024)   methocarbamol  (ROBAXIN ) 500 MG tablet Take 1 tablet (500 mg total) by mouth 2 (two) times daily. (Patient not taking: Reported on 07/17/2024)   No facility-administered encounter medications on file as of 07/17/2024.    History: Past Medical History:  Diagnosis Date   Blood in stool    H/O   History of chicken pox    Hyperlipidemia    PONV (postoperative nausea and vomiting)    Past Surgical History:  Procedure Laterality Date   CHOLECYSTECTOMY  1998   COLONOSCOPY     SHOULDER ARTHROSCOPY WITH BANKART REPAIR Left 09/14/2017   Procedure: SHOULDER ARTHROSCOPY WITH MINI OPEN ROTATOR CUFF REPAIR, SUBACROMINAL DECOMPRESSION, LABRAL REPAIR,BICEP TENOTOMY, DISTAL CLAVICLE EXCISION ;  Surgeon: Marchia Drivers, MD;  Location: [facility];  Service: Orthopedics;  Laterality: Left;   SHOULDER ARTHROSCOPY WITH OPEN ROTATOR CUFF REPAIR AND DISTAL CLAVICLE ACROMINECTOMY Right 01/16/2021   Procedure: RIGHT SHOULDER ARTHROSCOPY WITH MINI-OPEN ROTATOR CUFF REPAIR, SUBACCROMINAL DECOMPRESSION AND DISTAL CLAVICLE EXCISION;  Surgeon: Marchia Drivers, MD;  Location: [facility];  Service: Orthopedics;  Laterality: Right;   skin lesion on leg Left 06/2023   Family History  Problem Relation Age of Onset   Dementia Mother     Diabetes Father    Colitis Sister    Autoimmune disease Brother    Colitis Daughter    Social History   Occupational History   Not on file  Tobacco Use   Smoking status: Never   Smokeless tobacco: Never  Vaping Use   Vaping status: Never Used  Substance and Sexual Activity   Alcohol use: No    Alcohol/week: 0.0 standard drinks of alcohol    Comment: occ   Drug use: No   Sexual activity: Yes   Tobacco Counseling Counseling given: Not Answered  SDOH Screenings   Food Insecurity: Unknown (07/17/2024)  Housing: Low Risk (07/17/2024)  Transportation Needs: No Transportation Needs (07/17/2024)  Utilities: Not At Risk (07/17/2024)  Alcohol Screen: Low Risk (07/17/2024)  Depression (PHQ2-9): Low Risk (07/17/2024)  Financial Resource Strain: Low Risk (07/17/2024)  Physical Activity: Sufficiently Active (07/17/2024)  Social Connections: Socially Integrated (07/17/2024)  Stress: No Stress Concern Present (07/17/2024)  Tobacco Use: Low Risk (07/17/2024)  Health Literacy: Adequate Health Literacy (07/17/2024)   See flowsheets for full screening details  Depression Screen PHQ 2 & 9 Depression Scale- Over the past 2 weeks, how often have you been bothered by any of the following problems? Little interest or pleasure in doing things: 0 Feeling down, depressed, or hopeless (PHQ Adolescent also includes...irritable): 0 PHQ-2 Total Score: 0 Trouble falling or staying asleep, or sleeping too much: 0 Feeling tired or having little energy: 0 Poor appetite or overeating (PHQ Adolescent also includes...weight loss): 0 Feeling bad about yourself - or that you are a failure or have let yourself or your family down: 0 Trouble concentrating on things, such as reading the newspaper or watching television (PHQ Adolescent also includes...like school work): 0 Moving or speaking so slowly that other people  could have noticed. Or the opposite - being so fidgety or restless that you have been moving around a lot more  than usual: 0 Thoughts that you would be better off dead, or of hurting yourself in some way: 0 PHQ-9 Total Score: 0 If you checked off any problems, how difficult have these problems made it for you to do your work, take care of things at home, or get along with other people?: Not difficult at all     Goals Addressed             This Visit's Progress    Patient Stated       Wants to run on the treadmill              Objective:    Today's Vitals   07/17/24 1501  BP: 126/60  Weight: 156 lb (70.8 kg)  Height: 5' 7 (1.702 m)   Body mass index is 24.43 kg/m.  Hearing/Vision screen Hearing Screening - Comments:: No issues Vision Screening - Comments:: Glasses, [facility], up to date Immunizations and Health Maintenance Health Maintenance  Topic Date Due   Zoster Vaccines- Shingrix (1 of 2) 07/07/2001   Colonoscopy  11/01/2023   COVID-19 Vaccine (9 - 2025-26 season) 10/04/2024   Medicare Annual Wellness (AWV)  07/17/2025   DTaP/Tdap/Td (3 - Td or Tdap) 07/25/2033   Pneumococcal Vaccine: 50+ Years  Completed   Influenza Vaccine  Completed   Hepatitis C Screening  Completed   Meningococcal B Vaccine  Aged Out        Assessment/Plan:  This is a routine wellness examination for Donyell.  Patient Care Team: Glendia Shad, MD as PCP - General (Internal Medicine) Darliss Rogue, MD as PCP - Cardiology (Cardiology)  I have personally reviewed and noted the following in the patients chart:   Medical and social history Use of alcohol, tobacco or illicit drugs  Current medications and supplements including opioid prescriptions. Functional ability and status Nutritional status Physical activity Advanced directives List of other physicians Hospitalizations, surgeries, and ER visits in previous 12 months Vitals Screenings to include cognitive, depression, and falls Referrals and appointments  No orders of the defined types were placed in this  encounter.  In addition, I have reviewed and discussed with patient certain preventive protocols, quality metrics, and best practice recommendations. A written personalized care plan for preventive services as well as general preventive health recommendations were provided to patient.   Angeline Fredericks, LPN   02/14/7972   Return in 1 year (on 07/17/2025).  After Visit Summary: (MyChart) Due to this being a telephonic visit, the after visit summary with patients personalized plan was offered to patient via MyChart   Nurse Notes: Discussed the need to update shingles vaccines. Patient stated that he did a stool test in 2025 for his [employer] which the results came back negative. Patient stated that he will reach out to his [employer] and get that report sent to PCP for his records if he can not find the letter he received.  "

## 2024-07-20 ENCOUNTER — Encounter: Payer: Self-pay | Admitting: Cardiology

## 2024-07-28 ENCOUNTER — Telehealth: Payer: Self-pay | Admitting: Internal Medicine

## 2024-07-28 NOTE — Telephone Encounter (Signed)
 Pt dropped off a form he says he was asked to provide. It's in Dr Freda color folder up front

## 2024-07-31 ENCOUNTER — Other Ambulatory Visit: Payer: Self-pay | Admitting: Internal Medicine

## 2024-07-31 DIAGNOSIS — E7800 Pure hypercholesterolemia, unspecified: Secondary | ICD-10-CM

## 2024-07-31 NOTE — Telephone Encounter (Signed)
 Reviewed. Placed in box - FIT test is negative (02/11/24). Please abstract into epic.

## 2024-07-31 NOTE — Telephone Encounter (Signed)
 done

## 2024-08-02 NOTE — Progress Notes (Unsigned)
 " Cardiology Office Note   Date:  08/03/2024  ID:  Elijah Walker, DOB [birth year], MRN [id number] PCP: Glendia Shad, MD  [facility] Providers Cardiologist:  Redell Cave, MD Cardiology APP:  Gerard Frederick, NP     History of Present Illness Elijah Walker is a 74 y.o. male with past medical history of nonobstructive coronary artery disease (50% proximal left circumflex and mild LAD stenosis), hyperlipidemia, who presents today for follow-up.  He had previously been evaluated due to chest pain.  Coronary CTA showed nonobstructive CAD.  Echocardiogram showed normal systolic function with an LVEF of 60 to 65%.  He was started on baby aspirin  after coronary artery disease diagnoses, renal function decreased while on aspirin .  He stopped aspirin  with resulting improvement in renal function.    He was last seen in clinic 10/15/2023.  At that time he denied any chest pain or shortness of breath.  Exercise by running approximately 3 miles daily with no symptoms.  Compliant with Repatha  as prescribed.  He was continued on his current medication regimen and no further testing was ordered.  He returns to clinic today stating currently he is doing well from a cardiac perspective.  He had previously called into the nurse triage line with potential allergy to clopidogrel  where he had a rash that was itchy and he was lightheaded, dizzy and had weakness.  He had taken Allegra twice daily for several days to relieve the itchiness and the rash eventually stopped the clopidogrel  and went back on aspirin  81 mg daily.  He states since being off of clopidogrel  he is able to be back on the treadmill 5 times a week with a 15% incline without any issues.  He is continue to remain compliant with Repatha  without any undue side effects.  Denies any hospitalizations or visits to the emergency department.  ROS: 10 point review of system has been reviewed and considered negative the exception was been listed in the  HPI  Studies Reviewed      Cardiac Studies & Procedures   ______________________________________________________________________________________________     ECHOCARDIOGRAM  ECHOCARDIOGRAM COMPLETE 01/22/2022  Narrative ECHOCARDIOGRAM REPORT    Patient Name:   Elijah Walker Date of Exam: 01/22/2022 Medical Rec #:  [id number]     Height:       68.0 in Accession #:    [id number]    Weight:       168.4 lb Date of Birth:  [birth year]    BSA:          1.900 m Patient Age:    70 years      BP:           140/80 mmHg Patient Gender: M             HR:           56 bpm. Exam Location:    Procedure: 2D Echo, 3D Echo, Cardiac Doppler, Color Doppler and Strain Analysis  Indications:    R07.9* Chest pain, unspecified  History:        Patient has no prior history of Echocardiogram examinations. Signs/Symptoms:Chest Pain; Risk Factors:Dyslipidemia and Non-Smoker.  Sonographer:    Arley Pac RDMS, RVT, RDCS Referring Phys: [id number] BRIAN AGBOR-ETANG  IMPRESSIONS   1. Left ventricular ejection fraction, by estimation, is 60 to 65%. The left ventricle has normal function. The left ventricle has no regional wall motion abnormalities. Left ventricular diastolic parameters are consistent with Grade II diastolic dysfunction (pseudonormalization). The average left ventricular global longitudinal  strain is -17.2 %. 2. Right ventricular systolic function is normal. The right ventricular size is normal. There is normal pulmonary artery systolic pressure. The estimated right ventricular systolic pressure is 26.5 mmHg. 3. The mitral valve is normal in structure. Mild mitral valve regurgitation. No evidence of mitral stenosis. 4. The aortic valve is normal in structure. Aortic valve regurgitation is trivial. Aortic valve sclerosis is present, with no evidence of aortic valve stenosis. 5. The inferior vena cava is normal in size with greater than 50% respiratory variability, suggesting right  atrial pressure of 3 mmHg.  FINDINGS Left Ventricle: Left ventricular ejection fraction, by estimation, is 60 to 65%. The left ventricle has normal function. The left ventricle has no regional wall motion abnormalities. The average left ventricular global longitudinal strain is -17.2 %. The left ventricular internal cavity size was normal in size. There is borderline left ventricular hypertrophy. Left ventricular diastolic parameters are consistent with Grade II diastolic dysfunction (pseudonormalization).  Right Ventricle: The right ventricular size is normal. No increase in right ventricular wall thickness. Right ventricular systolic function is normal. There is normal pulmonary artery systolic pressure. The tricuspid regurgitant velocity is 2.32 m/s, and with an assumed right atrial pressure of 5 mmHg, the estimated right ventricular systolic pressure is 26.5 mmHg.  Left Atrium: Left atrial size was normal in size.  Right Atrium: Right atrial size was normal in size.  Pericardium: There is no evidence of pericardial effusion.  Mitral Valve: The mitral valve is normal in structure. Mild mitral valve regurgitation. No evidence of mitral valve stenosis.  Tricuspid Valve: The tricuspid valve is normal in structure. Tricuspid valve regurgitation is mild . No evidence of tricuspid stenosis.  Aortic Valve: The aortic valve is normal in structure. Aortic valve regurgitation is trivial. Aortic valve sclerosis is present, with no evidence of aortic valve stenosis. Aortic valve mean gradient measures 5.0 mmHg. Aortic valve peak gradient measures 10.1 mmHg. Aortic valve area, by VTI measures 2.25 cm.  Pulmonic Valve: The pulmonic valve was normal in structure. Pulmonic valve regurgitation is not visualized. No evidence of pulmonic stenosis.  Aorta: The aortic root is normal in size and structure.  Venous: The inferior vena cava is normal in size with greater than 50% respiratory variability,  suggesting right atrial pressure of 3 mmHg.  IAS/Shunts: No atrial level shunt detected by color flow Doppler.   LEFT VENTRICLE PLAX 2D LVIDd:         4.60 cm     Diastology LVIDs:         2.80 cm     LV e' medial:    7.72 cm/s LV PW:         0.90 cm     LV E/e' medial:  9.5 LV IVS:        1.00 cm     LV e' lateral:   9.57 cm/s LVOT diam:     2.30 cm     LV E/e' lateral: 7.7 LV SV:         85 LV SV Index:   45          2D Longitudinal Strain LVOT Area:     4.15 cm    2D Strain GLS Avg:     -17.2 %  LV Volumes (MOD) LV vol d, MOD A2C: 74.2 ml 3D Volume EF: LV vol d, MOD A4C: 93.3 ml 3D EF:        52 % LV vol s, MOD A2C: 34.1 ml LV  EDV:       107 ml LV vol s, MOD A4C: 42.7 ml LV ESV:       51 ml LV SV MOD A2C:     40.1 ml LV SV:        56 ml LV SV MOD A4C:     93.3 ml LV SV MOD BP:      47.8 ml  RIGHT VENTRICLE RV Basal diam:  4.50 cm RV Mid diam:    3.80 cm RV S prime:     8.70 cm/s RVOT diam:      3.60 cm TAPSE (M-mode): 2.6 cm  LEFT ATRIUM             Index        RIGHT ATRIUM           Index LA diam:        3.10 cm 1.63 cm/m   RA Area:     14.70 cm LA Vol (A2C):   39.5 ml 20.79 ml/m  RA Volume:   34.30 ml  18.06 ml/m LA Vol (A4C):   34.5 ml 18.16 ml/m LA Biplane Vol: 37.0 ml 19.48 ml/m AORTIC VALVE                     PULMONIC VALVE AV Area (Vmax):    2.22 cm      PV Vmax:       0.79 m/s AV Area (Vmean):   2.19 cm      PV Peak grad:  2.5 mmHg AV Area (VTI):     2.25 cm AV Vmax:           159.00 cm/s AV Vmean:          106.000 cm/s AV VTI:            0.378 m AV Peak Grad:      10.1 mmHg AV Mean Grad:      5.0 mmHg LVOT Vmax:         84.90 cm/s LVOT Vmean:        56.000 cm/s LVOT VTI:          0.205 m LVOT/AV VTI ratio: 0.54  AORTA Ao Root diam: 3.40 cm Ao Asc diam:  3.50 cm Ao Arch diam: 2.7 cm  MITRAL VALVE               TRICUSPID VALVE MV Area (PHT): 3.74 cm    TR Peak grad:   21.5 mmHg MV Decel Time: 203 msec    TR Vmax:        232.00 cm/s MV  E velocity: 73.70 cm/s MV A velocity: 60.00 cm/s  SHUNTS MV E/A ratio:  1.23        Systemic VTI:  0.20 m Systemic Diam: 2.30 cm Pulmonic Diam: 3.60 cm  Evalene Lunger MD Electronically signed by Evalene Lunger MD Signature Date/Time: 01/22/2022/4:17:26 PM    Final      CT SCANS  CT CARDIAC SCORING (SELF PAY ONLY) 07/04/2024  Addendum 07/28/2024  2:44 PM ADDENDUM REPORT: 07/28/2024 14:42  EXAM: OVER-READ INTERPRETATION  CT CHEST  The following report is an over-read performed by radiologist Dr. Elspeth Dada [facility] on 07/28/2024. This over-read does not include interpretation of cardiac or coronary anatomy or pathology. The cardiovascular interpretation by the cardiologist is attached.  COMPARISON:  CT chest, abdomen and pelvis dated 06/15/2024  FINDINGS: Stable 1.6 cm nodule with coarse central calcification in the posteromedial right lower  lobe inferiorly. Mm left lower lobe calcified granuloma on image number 16/2. No other lung nodules. No pleural fluid.  Unremarkable mediastinum and upper abdomen. Moderate thoracic spine degenerative changes.  IMPRESSION: 1. Stable 1.6 cm right lower lobe nodule with coarse central calcification, compatible with a benign hamartoma or partially calcified granuloma. 2. No acute abnormality.   Electronically Signed By: Elspeth Bathe M.D. On: 07/28/2024 14:42  Narrative CLINICAL DATA:  Risk stratification  EXAM: Coronary Calcium  Score  TECHNIQUE: The patient was scanned on a Siemens Somatom scanner. Axial non-contrast 3 mm slices were carried out through the heart. The data set was analyzed on a dedicated work station and scored using the Agatson method.  FINDINGS: Non-cardiac: See separate report from [facility].  Ascending Aorta: Normal size. Mild aortic valve/aortic root calcifications.  Descending Aorta: Mild calcifications.  Pericardium: Normal  Coronary arteries: Normal origin  of left and right coronary arteries. Distribution of arterial calcifications if present, as noted below;  LM 24  LAD 226  LCx 65  RCA 47  Total 362  IMPRESSION AND RECOMMENDATION: 1. Coronary calcium  score of 362. This was 63rd percentile for age and sex matched control.  2. Aortic atherosclerosis.  CAC >300 in LAD, LCx, RCA. CAC-DRS A3/N3.  CAC-DRS A0: CAC score 0: Statin generally not recommended unless other high risk condition (e.g., FH, DM2, tobacco use, etc.)  CAC-DRS A1: CAC score 1-99: moderate intensity statin  CAC-DRS A2: CAC score 100-299: moderate to high intensity statin + ASA 81mg   CAC-DRS A3: CAC score > 300: high intensity statin + ASA 81mg   Consider cardiology consultation.  Continue heart healthy lifestyle and risk factor modification.  Electronically Signed: By: Caron Poser On: 07/04/2024 14:59   CT SCANS  CT CORONARY FRACTIONAL FLOW RESERVE DATA PREP 12/29/2021  Narrative EXAM: CT FFR ANALYSIS  CLINICAL DATA:  Abnormal CCTA  FINDINGS: FFRct analysis was performed on the original cardiac CT angiogram dataset. Diagrammatic representation of the FFRct analysis is provided in a separate PDF document in PACS. This dictation was created using the PDF document and an interactive 3D model of the results. 3D model is not available in the EMR/PACS. Normal FFR range is >0.80.  1. Left Main:  No significant stenosis.  2. LAD: No significant focal stenosis. 3. LCX: No significant stenosis.  FFRct 0.89 4. RCA: No significant stenosis.  FFRct 0.88  IMPRESSION: 1.  CT FFR analysis didn't show any significant stenosis.   Electronically Signed By: Redell Cave M.D. On: 12/30/2021 10:28   CT CORONARY MORPH W/CTA COR W/SCORE 12/29/2021  Addendum 12/30/2021  9:31 AM ADDENDUM REPORT: 12/30/2021 09:29  EXAM: OVER-READ INTERPRETATION  CT CHEST  The following report is an over-read performed by radiologist Dr. Waddell Cola  [facility] on 12/30/2021. This over-read does not include interpretation of cardiac or coronary anatomy or pathology. The coronary calcium  score/coronary CTA interpretation by the cardiologist is attached.  COMPARISON:  CT chest 08/07/2015  FINDINGS: Vascular: No acute abnormality.  Mediastinum/nodes: No mediastinal mass or adenopathy identified.  Lungs/pleura:No pleural effusion, airspace consolidation, atelectasis, or pneumothorax identified. Within the right lung base there is a well-circumscribed, smoothly marginated, solid nodule containing central calcification measuring 1.8 cm. On 08/07/2015 this nodule measured 1.5 cm.  Upper abdomen: No acute abnormality.  Previous cholecystectomy.  Musculoskeletal: No acute or suspicious osseous findings.  IMPRESSION: No acute, noncardiac supplemental findings identified.  Right lower lobe nodule is smoothly marginated containing central calcification. This is mildly increased when compared with  2015 (on the order of 3 mm) and is favored to represent a benign indolent nodule such as pulmonary hamartoma or granuloma.   Electronically Signed By: Waddell Calk M.D. On: 12/30/2021 09:29  Narrative CLINICAL DATA:  Chest pain  EXAM: Cardiac/Coronary  CTA  TECHNIQUE: The patient was scanned on a Siemens Somatom go.Top scanner.  : A retrospective scan was triggered in the descending thoracic aorta. Axial non-contrast 3 mm slices were carried out through the heart. The data set was analyzed on a dedicated work station and scored using the Agatson method. Gantry rotation speed was 330 msecs and collimation was .6 mm. 25 mg of metoprolol  and 0.8 mg of sl NTG was given. The 3D data set was reconstructed in 5% intervals of the 60-95 % of the R-R cycle. Diastolic phases were analyzed on a dedicated work station using MPR, MIP and VRT modes. The patient received 75 cc of contrast.  FINDINGS: Aorta: Normal size. Aortic  root and descending aorta calcifications. No dissection.  Aortic Valve:  Trileaflet.  No calcifications.  Coronary Arteries:  Normal coronary origin.  Right dominance.  RCA is a dominant artery that gives rise to PDA and PLA. There is calcified plaque in the mid RCA causing minimal stenosis (<25%).  Left main gives rise to LAD and LCX arteries.  LM has no stenosis  LAD has calcified plaque in the proximal segment causing mild stenosis (25-49%).  LCX is a non-dominant artery that gives rise to two obtuse marginal branches. There is calcified plaque in the proximal LCx causing moderate stenosis (50-69%).  Other findings:  Normal pulmonary vein drainage into the left atrium.  Normal left atrial appendage without a thrombus.  Normal size of the pulmonary artery.  IMPRESSION: 1. Coronary calcium  score of 299. This was 63rd percentile for age and sex matched control.  2. Normal coronary origin with right dominance.  3. Calcified plaque in the proximal LCx causing moderate stenosis (50-69%).  4. Calcified plaque in the proximal LAD causing mild stenosis (25-49%).  5. CAD-RADS 3. Moderate stenosis. Consider symptom-guided anti-ischemic pharmacotherapy as well as risk factor modification per guideline directed care.  6. Additional analysis with CT FFR will be submitted and reported separately.  Electronically Signed: By: Redell Cave M.D. On: 12/29/2021 16:45     ______________________________________________________________________________________________      Risk Assessment/Calculations           Physical Exam VS:  BP 120/70 (BP Location: Left Arm, Patient Position: Sitting, Cuff Size: Normal)   Pulse 65   Ht 5' 8 (1.727 m)   Wt 160 lb (72.6 kg)   SpO2 98%   BMI 24.33 kg/m        Wt Readings from Last 3 Encounters:  08/03/24 160 lb (72.6 kg)  07/17/24 156 lb (70.8 kg)  06/27/24 164 lb (74.4 kg)    GEN: Well nourished, well developed in no  acute distress NECK: No JVD; No carotid bruits CARDIAC: RRR, no murmurs, rubs, gallops RESPIRATORY:  Clear to auscultation without rales, wheezing or rhonchi  ABDOMEN: Soft, non-tender, non-distended EXTREMITIES:  No edema; No deformity   ASSESSMENT AND PLAN Nonobstructive coronary artery disease with moderate proximal left circumflex stenosis of 50% and mild LAD disease.  Calcium  score of 299.  He is continued on aspirin  81 mg daily and Repatha .  He is intolerant to clopidogrel  and has been added as an allergy.  Previously he had prior renal injury while on aspirin  therapy but believes it was a fluke  in the numbers.  He has been scheduled for an updated BMP in 2 weeks after restarting aspirin  therapy to ensure there is no kidney injury related to therapy.  He denies any angina or symptoms of decompensation.  Hyperlipidemia with a last LDL of 68 with a goal of less than 70.  He is continued on Repatha .  He has been reading the literature and is concerned of ways he can further decrease his LDL.  We discussed additives other than statins like bempedoic acid and he will research that prior to the addition of any other medications.       Dispo: Patient to return to clinic to see MD/APP in 6 months or sooner if needed for further evaluation  Signed, Tabithia Stroder, NP   "

## 2024-08-03 ENCOUNTER — Encounter: Payer: Self-pay | Admitting: Cardiology

## 2024-08-03 ENCOUNTER — Ambulatory Visit: Attending: Cardiology | Admitting: Cardiology

## 2024-08-03 VITALS — BP 120/70 | HR 65 | Ht 68.0 in | Wt 160.0 lb

## 2024-08-03 DIAGNOSIS — E7800 Pure hypercholesterolemia, unspecified: Secondary | ICD-10-CM | POA: Diagnosis not present

## 2024-08-03 DIAGNOSIS — Z79899 Other long term (current) drug therapy: Secondary | ICD-10-CM | POA: Diagnosis not present

## 2024-08-03 DIAGNOSIS — I2510 Atherosclerotic heart disease of native coronary artery without angina pectoris: Secondary | ICD-10-CM | POA: Diagnosis not present

## 2024-08-03 NOTE — Patient Instructions (Signed)
 Medication Instructions:   Your physician recommends that you continue on your current medications as directed. Please refer to the Current Medication list given to you today.   *If you need a refill on your cardiac medications before your next appointment, please call your pharmacy*  Please research Bempedoic Acid/ Nexletol.  Lab Work: Your provider would like for you to return in 2 weeks to have the following labs drawn: BMP.   Please go to [facility] [address] ([facility] [address], [place] [address] You do not need an appointment.  They are open from 8 am- 4:30 pm.  Lunch from 1:00 pm- 2:00 pm You will not need to be fasting.    If you have labs (blood work) drawn today and your tests are completely normal, you will receive your results only by: MyChart Message (if you have MyChart) OR A paper copy in the mail If you have any lab test that is abnormal or we need to change your treatment, we will call you to review the results.  Testing/Procedures: No test ordered today   Follow-Up: At [facility], you and your health needs are our priority.  As part of our continuing mission to provide you with exceptional heart care, our providers are all part of one team.  This team includes your primary Cardiologist (physician) and Advanced Practice Providers or APPs (Physician Assistants and Nurse Practitioners) who all work together to provide you with the care you need, when you need it.  Your next appointment:   6 month(s)  Provider:   You may see Redell Cave, MD or one of the following Advanced Practice Providers on your designated Care Team:   Lonni Meager, NP Lesley Maffucci, PA-C Bernardino Bring, PA-C Cadence [place], PA-C Tylene Lunch, NP Barnie Hila, NP    We recommend signing up for the patient portal called MyChart.  Sign up information is provided on this After Visit Summary.  MyChart is used to connect with patients for  Virtual Visits (Telemedicine).  Patients are able to view lab/test results, encounter notes, upcoming appointments, etc.  Non-urgent messages can be sent to your provider as well.   To learn more about what you can do with MyChart, go to [email or web address].

## 2024-08-16 ENCOUNTER — Ambulatory Visit: Payer: Self-pay | Admitting: Cardiology

## 2024-08-16 LAB — BASIC METABOLIC PANEL WITH GFR
BUN/Creatinine Ratio: 11 (ref 10–24)
BUN: 13 mg/dL (ref 8–27)
CO2: 23 mmol/L (ref 20–29)
Calcium: 9.8 mg/dL (ref 8.6–10.2)
Chloride: 105 mmol/L (ref 96–106)
Creatinine, Ser: 1.2 mg/dL (ref 0.76–1.27)
Glucose: 99 mg/dL (ref 70–99)
Potassium: 5 mmol/L (ref 3.5–5.2)
Sodium: 143 mmol/L (ref 134–144)
eGFR: 64 mL/min/{1.73_m2}

## 2024-08-16 NOTE — Progress Notes (Signed)
 Kidney function has improved back to baseline and electrolytes have remained stable.  Continue current medication regimen without changes needed at this time.

## 2024-09-26 ENCOUNTER — Ambulatory Visit: Admitting: Internal Medicine

## 2025-07-18 ENCOUNTER — Ambulatory Visit
